# Patient Record
Sex: Female | Born: 1935 | Race: Black or African American | Hispanic: No | Smoking: Never smoker
Health system: Southern US, Community
[De-identification: ages and names within clinical notes are randomized; demographics above are authoritative.]

## PROBLEM LIST (undated history)

## (undated) DIAGNOSIS — I10 Essential (primary) hypertension: Secondary | ICD-10-CM

## (undated) DIAGNOSIS — F0390 Unspecified dementia without behavioral disturbance: Secondary | ICD-10-CM

## (undated) DIAGNOSIS — E7800 Pure hypercholesterolemia, unspecified: Secondary | ICD-10-CM

## (undated) HISTORY — PX: ABDOMINAL HYSTERECTOMY: SHX81

---

## 1999-07-04 ENCOUNTER — Encounter: Payer: Self-pay | Admitting: Obstetrics and Gynecology

## 1999-07-04 ENCOUNTER — Encounter: Admission: RE | Admit: 1999-07-04 | Discharge: 1999-07-04 | Payer: Self-pay | Admitting: Obstetrics and Gynecology

## 1999-07-04 ENCOUNTER — Other Ambulatory Visit: Admission: RE | Admit: 1999-07-04 | Discharge: 1999-07-04 | Payer: Self-pay | Admitting: Obstetrics and Gynecology

## 1999-08-08 ENCOUNTER — Emergency Department (HOSPITAL_COMMUNITY): Admission: EM | Admit: 1999-08-08 | Discharge: 1999-08-08 | Payer: Self-pay | Admitting: Emergency Medicine

## 1999-08-09 ENCOUNTER — Encounter: Payer: Self-pay | Admitting: Emergency Medicine

## 1999-11-26 ENCOUNTER — Encounter: Payer: Self-pay | Admitting: *Deleted

## 1999-11-26 ENCOUNTER — Encounter: Admission: RE | Admit: 1999-11-26 | Discharge: 1999-11-26 | Payer: Self-pay | Admitting: *Deleted

## 2000-09-28 ENCOUNTER — Other Ambulatory Visit: Admission: RE | Admit: 2000-09-28 | Discharge: 2000-09-28 | Payer: Self-pay | Admitting: Obstetrics and Gynecology

## 2000-10-01 ENCOUNTER — Encounter: Admission: RE | Admit: 2000-10-01 | Discharge: 2000-10-01 | Payer: Self-pay | Admitting: Obstetrics and Gynecology

## 2000-10-01 ENCOUNTER — Encounter: Payer: Self-pay | Admitting: Obstetrics and Gynecology

## 2002-08-01 ENCOUNTER — Encounter: Payer: Self-pay | Admitting: Internal Medicine

## 2002-08-01 ENCOUNTER — Encounter: Admission: RE | Admit: 2002-08-01 | Discharge: 2002-08-01 | Payer: Self-pay | Admitting: Internal Medicine

## 2004-04-29 ENCOUNTER — Encounter: Admission: RE | Admit: 2004-04-29 | Discharge: 2004-04-29 | Payer: Self-pay | Admitting: Internal Medicine

## 2005-06-26 ENCOUNTER — Encounter: Admission: RE | Admit: 2005-06-26 | Discharge: 2005-06-26 | Payer: Self-pay | Admitting: Diagnostic Radiology

## 2006-08-06 ENCOUNTER — Encounter: Admission: RE | Admit: 2006-08-06 | Discharge: 2006-08-06 | Payer: Self-pay | Admitting: Internal Medicine

## 2007-09-02 ENCOUNTER — Encounter: Admission: RE | Admit: 2007-09-02 | Discharge: 2007-09-02 | Payer: Self-pay | Admitting: Internal Medicine

## 2008-09-28 ENCOUNTER — Encounter: Admission: RE | Admit: 2008-09-28 | Discharge: 2008-09-28 | Payer: Self-pay | Admitting: Internal Medicine

## 2009-10-11 ENCOUNTER — Encounter: Admission: RE | Admit: 2009-10-11 | Discharge: 2009-10-11 | Payer: Self-pay | Admitting: Internal Medicine

## 2010-10-31 ENCOUNTER — Other Ambulatory Visit: Payer: Self-pay | Admitting: Internal Medicine

## 2010-10-31 DIAGNOSIS — Z1231 Encounter for screening mammogram for malignant neoplasm of breast: Secondary | ICD-10-CM

## 2010-11-04 ENCOUNTER — Ambulatory Visit: Payer: Self-pay

## 2010-12-05 ENCOUNTER — Ambulatory Visit
Admission: RE | Admit: 2010-12-05 | Discharge: 2010-12-05 | Disposition: A | Discharge status: Home / Self Care | Payer: Medicare Other | Source: Ambulatory Visit | Attending: Internal Medicine | Admitting: Internal Medicine

## 2010-12-05 DIAGNOSIS — Z1231 Encounter for screening mammogram for malignant neoplasm of breast: Secondary | ICD-10-CM

## 2011-06-28 ENCOUNTER — Encounter (HOSPITAL_COMMUNITY): Payer: Self-pay | Admitting: Emergency Medicine

## 2011-06-28 ENCOUNTER — Emergency Department (HOSPITAL_COMMUNITY)
Admission: EM | Admit: 2011-06-28 | Discharge: 2011-06-28 | Disposition: A | Discharge status: Home / Self Care | Payer: Medicare Other | Source: Home / Self Care | Attending: Emergency Medicine | Admitting: Emergency Medicine

## 2011-06-28 DIAGNOSIS — R0981 Nasal congestion: Secondary | ICD-10-CM

## 2011-06-28 DIAGNOSIS — R05 Cough: Secondary | ICD-10-CM

## 2011-06-28 DIAGNOSIS — J3489 Other specified disorders of nose and nasal sinuses: Secondary | ICD-10-CM

## 2011-06-28 HISTORY — DX: Essential (primary) hypertension: I10

## 2011-06-28 HISTORY — DX: Pure hypercholesterolemia, unspecified: E78.00

## 2011-06-28 MED ORDER — CETIRIZINE HCL 10 MG PO CHEW: 10.0000 mg | CHEWABLE_TABLET | Freq: Every day | ORAL | Status: DC

## 2011-06-28 MED ORDER — GUAIFENESIN-CODEINE 100-10 MG/5ML PO SYRP: 5.0000 mL | ORAL_SOLUTION | Freq: Three times a day (TID) | ORAL | Status: AC | PRN

## 2011-06-28 NOTE — ED Provider Notes (Signed)
History     CSN: [id number]  Arrival date & time 06/28/11  1140   First MD Initiated Contact with Patient 06/28/11 1233      Chief Complaint  Patient presents with  . URI    (Consider location/radiation/quality/duration/timing/severity/associated sxs/prior treatment) HPI Comments: Patient presents urgent care complaining of 2-3 days of head congestion, dripping behind her throat runny and congested nose patient denies any shortness of breath wheezing or any fevers describes her cough as dry and nonproductive. No body aches no headaches does feel some sinus pressure. No nausea or vomiting.  Patient is a 77 y.o. female presenting with URI. The history is provided by the patient.  URI The primary symptoms include fever, sore throat and cough. Primary symptoms do not include wheezing, nausea, vomiting, myalgias, arthralgias or rash. The current episode started 2 days ago. This is a new problem. The problem has not changed since onset. The sore throat is not accompanied by trouble swallowing.  Symptoms associated with the illness include chills, sinus pressure, congestion and rhinorrhea.    Past Medical History  Diagnosis Date  . Hypertension   . High cholesterol     Past Surgical History  Procedure Date  . Abdominal hysterectomy     No family history on file.  History  Substance Use Topics  . Smoking status: Never Smoker   . Smokeless tobacco: Not on file  . Alcohol Use: No    OB History    Grav Para Term Preterm Abortions TAB SAB Ect Mult Living                  Review of Systems  Constitutional: Positive for fever, chills and activity change.  HENT: Positive for congestion, sore throat, rhinorrhea and sinus pressure. Negative for trouble swallowing and voice change.   Eyes: Negative for pain.  Respiratory: Positive for cough. Negative for chest tightness and wheezing.   Gastrointestinal: Negative for nausea and vomiting.  Musculoskeletal: Negative for myalgias and  arthralgias.  Skin: Negative for rash.    Allergies  Penicillins  Home Medications   Current Outpatient Rx  Name Route Sig Dispense Refill  . OVER THE COUNTER MEDICATION  Reports one blood pressure medicine taken 2 times each day One cholesterol medicine, 2 tablets once each day.  Also takes multivitamins      BP 129/75  Pulse 66  Temp(Src) 99.8 F (37.7 C) (Oral)  Resp 18  SpO2 100%  Physical Exam  Nursing note and vitals reviewed. Constitutional: She appears well-developed and well-nourished.  HENT:  Head: Normocephalic.  Right Ear: Tympanic membrane normal.  Left Ear: Tympanic membrane normal.  Mouth/Throat: Uvula is midline. Posterior oropharyngeal erythema present.  Eyes: Conjunctivae are normal. No scleral icterus.  Neck: Neck supple. No JVD present.  Cardiovascular: Normal rate.   Pulmonary/Chest: Effort normal. No respiratory distress. She has no decreased breath sounds. She has no wheezes. She has no rhonchi. She has no rales. She exhibits no tenderness.  Musculoskeletal: Normal range of motion.  Lymphadenopathy:    She has no cervical adenopathy.  Neurological: She is alert.  Skin: No rash noted.    ED Course  Procedures (including critical care time)  Labs Reviewed - No data to display No results found.   No diagnosis found.    MDM   Mild upper respiratory congestion, mild sinus congestion postnasal dripping for 3 days. Afebrile comfortable not dyspneic possibly viral or compartmental changes symptomatic management and encourage with Zyrtec and we will provide her  with cough syrup and she's having a dry bothersome cough that does not allow her to sleep and rest well.       Rosana Hoes, MD 06/28/11 [phone]

## 2011-06-28 NOTE — ED Notes (Signed)
C/o head congestion and feels congestion in chest.  Denies wheezing, denies fever.  Non productive cough.

## 2011-06-28 NOTE — Discharge Instructions (Signed)
  As discussed try to hydrate herself well and use a humidifier at night and takes Zyrtec. Avoid any other over-the-counter medicines. Can take his syrup cautiously as it can make you feel dizzy for your cough every 6 hours and bothersome especially at night.   Cough, Adult  A cough is a reflex. It helps you clear your throat and airways. A cough can help heal your body. A cough can last 2 or 3 weeks (acute) or may last more than 8 weeks (chronic). Some common causes of a cough can include an infection, allergy, or a cold. HOME CARE  Only take medicine as told by your doctor.   If given, take your medicines (antibiotics) as told. Finish them even if you start to feel better.   Use a cold steam vaporizer or humidier in your home. This can help loosen thick spit (secretions).   Sleep so you are almost sitting up (semi-upright). Use pillows to do this. This helps reduce coughing.   Rest as needed.   Stop smoking if you smoke.  GET HELP RIGHT AWAY IF:  You have yellowish-white fluid (pus) in your thick spit.   Your cough gets worse.   Your medicine does not reduce coughing, and you are losing sleep.   You cough up blood.   You have trouble breathing.   Your pain gets worse and medicine does not help.   You have a fever.  MAKE SURE YOU:   Understand these instructions.   Will watch your condition.   Will get help right away if you are not doing well or get worse.  Document Released: 11/21/2010 Document Revised: 02/27/2011 Document Reviewed: 11/21/2010 [facility] Patient Information 2012 [place].

## 2012-01-19 ENCOUNTER — Other Ambulatory Visit: Payer: Self-pay | Admitting: Internal Medicine

## 2012-01-19 DIAGNOSIS — Z1231 Encounter for screening mammogram for malignant neoplasm of breast: Secondary | ICD-10-CM

## 2012-02-10 ENCOUNTER — Ambulatory Visit: Payer: Medicare Other

## 2012-02-20 ENCOUNTER — Ambulatory Visit
Admission: RE | Admit: 2012-02-20 | Discharge: 2012-02-20 | Disposition: A | Discharge status: Home / Self Care | Payer: Medicare Other | Source: Ambulatory Visit | Attending: Internal Medicine | Admitting: Internal Medicine

## 2012-02-20 DIAGNOSIS — Z1231 Encounter for screening mammogram for malignant neoplasm of breast: Secondary | ICD-10-CM

## 2013-03-28 ENCOUNTER — Other Ambulatory Visit: Payer: Self-pay

## 2013-03-28 DIAGNOSIS — Z1231 Encounter for screening mammogram for malignant neoplasm of breast: Secondary | ICD-10-CM

## 2013-03-29 ENCOUNTER — Ambulatory Visit
Admission: RE | Admit: 2013-03-29 | Discharge: 2013-03-29 | Disposition: A | Discharge status: Home / Self Care | Payer: Self-pay | Source: Ambulatory Visit

## 2013-03-29 DIAGNOSIS — Z1231 Encounter for screening mammogram for malignant neoplasm of breast: Secondary | ICD-10-CM

## 2014-05-22 ENCOUNTER — Other Ambulatory Visit: Payer: Self-pay

## 2014-05-22 DIAGNOSIS — Z1231 Encounter for screening mammogram for malignant neoplasm of breast: Secondary | ICD-10-CM

## 2014-06-20 ENCOUNTER — Ambulatory Visit
Admission: RE | Admit: 2014-06-20 | Discharge: 2014-06-20 | Disposition: A | Discharge status: Home / Self Care | Payer: Medicare Other | Source: Ambulatory Visit

## 2014-06-20 DIAGNOSIS — Z1231 Encounter for screening mammogram for malignant neoplasm of breast: Secondary | ICD-10-CM

## 2015-05-22 ENCOUNTER — Other Ambulatory Visit: Payer: Self-pay

## 2015-05-22 DIAGNOSIS — Z1231 Encounter for screening mammogram for malignant neoplasm of breast: Secondary | ICD-10-CM

## 2015-09-15 ENCOUNTER — Encounter (HOSPITAL_COMMUNITY): Payer: Self-pay | Admitting: Emergency Medicine

## 2015-09-15 ENCOUNTER — Ambulatory Visit (HOSPITAL_COMMUNITY): Payer: Medicare Other

## 2015-09-15 ENCOUNTER — Ambulatory Visit (HOSPITAL_COMMUNITY)
Admission: EM | Admit: 2015-09-15 | Discharge: 2015-09-15 | Disposition: A | Discharge status: Home / Self Care | Payer: Medicare Other | Attending: Emergency Medicine | Admitting: Emergency Medicine

## 2015-09-15 DIAGNOSIS — S20212A Contusion of left front wall of thorax, initial encounter: Secondary | ICD-10-CM | POA: Diagnosis not present

## 2015-09-15 DIAGNOSIS — M25562 Pain in left knee: Secondary | ICD-10-CM | POA: Diagnosis present

## 2015-09-15 DIAGNOSIS — E7800 Pure hypercholesterolemia, unspecified: Secondary | ICD-10-CM | POA: Diagnosis not present

## 2015-09-15 DIAGNOSIS — S66912A Strain of unspecified muscle, fascia and tendon at wrist and hand level, left hand, initial encounter: Secondary | ICD-10-CM | POA: Diagnosis not present

## 2015-09-15 DIAGNOSIS — Z880 Allergy status to penicillin: Secondary | ICD-10-CM | POA: Diagnosis not present

## 2015-09-15 DIAGNOSIS — W19XXXA Unspecified fall, initial encounter: Secondary | ICD-10-CM | POA: Diagnosis not present

## 2015-09-15 DIAGNOSIS — S8002XA Contusion of left knee, initial encounter: Secondary | ICD-10-CM | POA: Diagnosis not present

## 2015-09-15 DIAGNOSIS — I10 Essential (primary) hypertension: Secondary | ICD-10-CM | POA: Diagnosis not present

## 2015-09-15 MED ORDER — TRAMADOL HCL 50 MG PO TABS: 50.0000 mg | ORAL_TABLET | Freq: Four times a day (QID) | ORAL | Status: DC | PRN

## 2015-09-15 NOTE — ED Notes (Signed)
Pt reports she fell down in a grocery store and inj her left knee, left hand and sustained a contusion to chest as she fell forward Slow gait... Daughter at bedside... A&O x4... No acute distress.

## 2015-09-15 NOTE — Discharge Instructions (Signed)
I reviewed your x-rays, there is no sign of broken bone. You do have some swelling and bruising. If I get the report tonight, I will call you. Otherwise, I will call you with the results tomorrow. Apply ice to your wrist and knee as often as you can. This will help with the swelling. The knee wrapped in the Ace wrap for the next 48 hours. Take Tylenol or ibuprofen as needed for pain. Use the tramadol every 6-8 hours as needed for severe pain. This medicine may make you drowsy or off balance. Follow-up as needed.

## 2015-09-15 NOTE — ED Provider Notes (Signed)
CSN: [id number]     Arrival date & time 09/15/15  1920 History   First MD Initiated Contact with Patient 09/15/15 1931     Chief Complaint  Patient presents with  . Fall   (Consider location/radiation/quality/duration/timing/severity/associated sxs/prior Treatment) HPI  She is a 81 year old woman here for evaluation after a fall. She states she went to pick up a watermelon at the grocery store earlier today when her feet slipped and she fell. She states she landed on her left knee and left hand. She also hit her chest on something. No head injury or loss of consciousness. She is able to ambulate, but states she has pain in the left knee with ambulation. She also reports swelling over the anterior knee. This is the area of pain. She also reports some swelling and pain in the left ulnar wrist. No chest pain or shortness of breath. She denies any preceding dizziness or chest pain.  Past Medical History  Diagnosis Date  . Hypertension   . High cholesterol    Past Surgical History  Procedure Laterality Date  . Abdominal hysterectomy     History reviewed. No pertinent family history. Social History  Substance Use Topics  . Smoking status: Never Smoker   . Smokeless tobacco: None  . Alcohol Use: No   OB History    No data available     Review of Systems As in history of present illness Allergies  Penicillins  Home Medications   Prior to Admission medications   Medication Sig Start Date End Date Taking? Authorizing Provider  cetirizine (ZYRTEC) 10 MG chewable tablet Chew 1 tablet (10 mg total) by mouth daily. 06/28/11 07/04/12  Rosana Hoes, MD  OVER THE COUNTER MEDICATION Reports one blood pressure medicine taken 2 times each day One cholesterol medicine, 2 tablets once each day.  Also takes multivitamins    Historical Provider, MD  traMADol (ULTRAM) 50 MG tablet Take 1 tablet (50 mg total) by mouth every 6 (six) hours as needed. 09/15/15   Melony Overly, MD   Meds Ordered and  Administered this Visit  Medications - No data to display  BP 172/88 mmHg  Pulse 78  Temp(Src) 98.4 F (36.9 C) (Oral)  Resp 20  SpO2 98% No data found.   Physical Exam  Constitutional: She is oriented to person, place, and time. She appears well-developed and well-nourished. No distress.  Cardiovascular: Normal rate.   Pulmonary/Chest: Effort normal.  Musculoskeletal:  Left knee: There is soft tissue swelling over the patella. This is quite tender to palpation. No erythema or bruising seen. No joint line tenderness. Left wrist: There is some swelling over the ulnar wrist. She is tender over the distal ulna. Brisk cap refill in all digits. Sensation grossly intact. Pain with wrist movements.  Neurological: She is alert and oriented to person, place, and time.  Skin:  Small bruise just inferior to the medial left clavicle. This is nontender.    ED Course  Procedures (including critical care time)  Labs Review Labs Reviewed - No data to display  Imaging Review No results found. I have personally reviewed the x-rays and there is no obvious fracture or dislocation of the knee or wrist.  MDM   1. Fall, initial encounter   2. Knee contusion, left, initial encounter   3. Wrist strain, left, initial encounter   4. Contusion, chest wall, left, initial encounter    Discussed options with patient and her daughter. We will go ahead and discharge him  with symptomatic treatment for bruising and strain. I will call them with the results of the x-rays tomorrow afternoon. Ace wrap applied to knee for support and compression. Recommended frequent icing. Tylenol or ibuprofen as needed for pain. Prescription given for tramadol to use as needed for severe pain. Follow-up as needed.    Melony Overly, MD 09/15/15 2107

## 2015-10-11 ENCOUNTER — Ambulatory Visit: Payer: Medicare Other

## 2015-10-11 ENCOUNTER — Ambulatory Visit
Admission: RE | Admit: 2015-10-11 | Discharge: 2015-10-11 | Disposition: A | Discharge status: Home / Self Care | Payer: Medicare Other | Source: Ambulatory Visit

## 2015-10-11 ENCOUNTER — Other Ambulatory Visit: Payer: Self-pay | Admitting: Internal Medicine

## 2015-10-11 DIAGNOSIS — Z1231 Encounter for screening mammogram for malignant neoplasm of breast: Secondary | ICD-10-CM

## 2017-01-14 ENCOUNTER — Other Ambulatory Visit: Payer: Self-pay | Admitting: Internal Medicine

## 2017-01-14 DIAGNOSIS — Z1231 Encounter for screening mammogram for malignant neoplasm of breast: Secondary | ICD-10-CM

## 2017-02-03 ENCOUNTER — Ambulatory Visit: Payer: Medicare Other

## 2017-04-27 ENCOUNTER — Ambulatory Visit
Admission: RE | Admit: 2017-04-27 | Discharge: 2017-04-27 | Disposition: A | Discharge status: Home / Self Care | Payer: Medicare HMO | Source: Ambulatory Visit | Attending: Internal Medicine | Admitting: Internal Medicine

## 2017-04-27 DIAGNOSIS — Z1231 Encounter for screening mammogram for malignant neoplasm of breast: Secondary | ICD-10-CM

## 2017-07-06 ENCOUNTER — Encounter (HOSPITAL_COMMUNITY): Payer: Self-pay | Admitting: Family Medicine

## 2017-07-06 ENCOUNTER — Ambulatory Visit (HOSPITAL_COMMUNITY)
Admission: EM | Admit: 2017-07-06 | Discharge: 2017-07-06 | Disposition: A | Discharge status: Home / Self Care | Payer: Medicare HMO | Attending: Family Medicine | Admitting: Family Medicine

## 2017-07-06 DIAGNOSIS — K5790 Diverticulosis of intestine, part unspecified, without perforation or abscess without bleeding: Secondary | ICD-10-CM

## 2017-07-06 LAB — POCT URINALYSIS DIP (DEVICE)
GLUCOSE, UA: NEGATIVE mg/dL
HGB URINE DIPSTICK: NEGATIVE
Leukocytes, UA: NEGATIVE
Nitrite: NEGATIVE
PH: 6 (ref 5.0–8.0)
PROTEIN: 100 mg/dL — AB
SPECIFIC GRAVITY, URINE: 1.02 (ref 1.005–1.030)
UROBILINOGEN UA: 0.2 mg/dL (ref 0.0–1.0)

## 2017-07-06 MED ORDER — CIPROFLOXACIN HCL 500 MG PO TABS: 500.0000 mg | ORAL_TABLET | Freq: Two times a day (BID) | ORAL | 0 refills | Status: DC

## 2017-07-06 MED ORDER — ACETAMINOPHEN 325 MG PO TABS
ORAL_TABLET | ORAL | Status: AC
  Filled 2017-07-06: qty 1

## 2017-07-06 MED ORDER — ACETAMINOPHEN 325 MG PO TABS
650.0000 mg | ORAL_TABLET | Freq: Once | ORAL | Status: AC
  Administered 2017-07-06: 650 mg via ORAL

## 2017-07-06 MED ORDER — METRONIDAZOLE 500 MG PO TABS: 500.0000 mg | ORAL_TABLET | Freq: Two times a day (BID) | ORAL | 0 refills | Status: DC

## 2017-07-06 NOTE — ED Triage Notes (Addendum)
Pt here for abd cramping during the day for about 1 week. sts some nausea. Worse when eating or drinking. LBM this am and slight diarrhea. Denies any rectal bleeding.

## 2017-07-06 NOTE — ED Provider Notes (Signed)
[place]    CSN: [id number] Arrival date & time: 07/06/17  1808     History   Chief Complaint Chief Complaint  Patient presents with  . Abdominal Pain    HPI Donna Butler is a 83 y.o. female.   83 year old female with complaints of lower abdominal pain.  She is basically healthy for her age.  She takes lisinopril and lovastatin.  She has had some diarrhea prior to this episode of pain has had some constipation history.  She is unaware that she has a fever today.  She denies any cough urinary tract symptoms myalgias or basically anything else to suggest a cause for the fever.  HPI  Past Medical History:  Diagnosis Date  . High cholesterol   . Hypertension     There are no active problems to display for this patient.   Past Surgical History:  Procedure Laterality Date  . ABDOMINAL HYSTERECTOMY      OB History   None      Home Medications    Prior to Admission medications   Medication Sig Start Date End Date Taking? Authorizing Provider  lisinopril (PRINIVIL,ZESTRIL) 10 MG tablet Take 10 mg by mouth daily.   Yes [provider]  lovastatin (MEVACOR) 40 MG tablet Take 40 mg by mouth at bedtime.   Yes [provider]  cetirizine (ZYRTEC) 10 MG chewable tablet Chew 1 tablet (10 mg total) by mouth daily. 06/28/11 07/04/12  Rosana Hoes, MD  OVER THE COUNTER MEDICATION Reports one blood pressure medicine taken 2 times each day One cholesterol medicine, 2 tablets once each day.  Also takes multivitamins    [provider]    Family History History reviewed. No pertinent family history.  Social History Social History   Tobacco Use  . Smoking status: Never Smoker  Substance Use Topics  . Alcohol use: No  . Drug use: No     Allergies   Penicillins   Review of Systems Review of Systems  Constitutional: Positive for fever.  HENT: Negative.   Respiratory: Negative.   Cardiovascular: Negative.   Gastrointestinal:  Positive for abdominal pain and diarrhea.  Musculoskeletal: Negative.   Neurological: Negative.      Physical Exam Triage Vital Signs ED Triage Vitals  Enc Vitals Group     BP 07/06/17 1845 (!) 126/59     Pulse Rate 07/06/17 1845 62     Resp 07/06/17 1845 18     Temp 07/06/17 1845 (!) 102.3 F (39.1 C)     Temp src --      SpO2 07/06/17 1845 96 %     Weight --      Height --      Head Circumference --      Peak Flow --      Pain Score 07/06/17 1842 6     Pain Loc --      Pain Edu? --      Excl. in [place]? --    No data found.  Updated Vital Signs BP (!) 126/59   Pulse 62   Temp (!) 102.3 F (39.1 C)   Resp 18   SpO2 96%   Visual Acuity Right Eye Distance:   Left Eye Distance:   Bilateral Distance:    Right Eye Near:   Left Eye Near:    Bilateral Near:     Physical Exam  Constitutional: She appears well-developed and well-nourished.  Eyes: Pupils are equal, round, and reactive to light.  Pulmonary/Chest: Effort normal.  Abdominal: Bowel sounds are normal. She exhibits no abdominal bruit. There is no tenderness.     UC Treatments / Results  Labs (all labs ordered are listed, but only abnormal results are displayed) Labs Reviewed  POCT URINALYSIS DIP (DEVICE) - Abnormal; Notable for the following components:      Result Value   Bilirubin Urine SMALL (*)    Ketones, ur TRACE (*)    Protein, ur 100 (*)    All other components within normal limits    EKG None Radiology No results found.  Procedures Procedures (including critical care time)  Medications Ordered in UC Medications  acetaminophen (TYLENOL) tablet 650 mg (has no administration in time range)     Initial Impression / Assessment and Plan / UC Course  I have reviewed the triage vital signs and the nursing notes.  Pertinent labs & imaging results that were available during my care of the patient were reviewed by me and considered in my medical decision making (see chart for details).      Abdominal pain with fever and diarrhea.  With history of constipation and lack of any other obvious source of fever would favor diverticular disease.  I can palpate no mass or abscess in the lower abdomen there is no guarding or rebound.  Final Clinical Impressions(s) / UC Diagnoses   Final diagnoses:  None    ED Discharge Orders    None       Controlled Substance Prescriptions  Controlled Substance Registry consulted? No   Wardell Honour, MD 07/06/17 [phone]

## 2017-07-07 ENCOUNTER — Inpatient Hospital Stay (HOSPITAL_COMMUNITY)
Admission: EM | Admit: 2017-07-07 | Discharge: 2017-07-10 | DRG: 392 | Disposition: A | Discharge status: Home / Self Care | Payer: Medicare HMO | Attending: Internal Medicine | Admitting: Internal Medicine

## 2017-07-07 ENCOUNTER — Emergency Department (HOSPITAL_COMMUNITY): Payer: Medicare HMO

## 2017-07-07 ENCOUNTER — Encounter (HOSPITAL_COMMUNITY): Payer: Self-pay | Admitting: Emergency Medicine

## 2017-07-07 DIAGNOSIS — Z880 Allergy status to penicillin: Secondary | ICD-10-CM

## 2017-07-07 DIAGNOSIS — A09 Infectious gastroenteritis and colitis, unspecified: Principal | ICD-10-CM | POA: Diagnosis present

## 2017-07-07 DIAGNOSIS — E876 Hypokalemia: Secondary | ICD-10-CM | POA: Diagnosis present

## 2017-07-07 DIAGNOSIS — K838 Other specified diseases of biliary tract: Secondary | ICD-10-CM | POA: Diagnosis present

## 2017-07-07 DIAGNOSIS — R932 Abnormal findings on diagnostic imaging of liver and biliary tract: Secondary | ICD-10-CM | POA: Diagnosis present

## 2017-07-07 DIAGNOSIS — E7800 Pure hypercholesterolemia, unspecified: Secondary | ICD-10-CM | POA: Diagnosis present

## 2017-07-07 DIAGNOSIS — K529 Noninfective gastroenteritis and colitis, unspecified: Secondary | ICD-10-CM | POA: Diagnosis present

## 2017-07-07 DIAGNOSIS — K5100 Ulcerative (chronic) pancolitis without complications: Secondary | ICD-10-CM

## 2017-07-07 DIAGNOSIS — Z79899 Other long term (current) drug therapy: Secondary | ICD-10-CM

## 2017-07-07 DIAGNOSIS — I10 Essential (primary) hypertension: Secondary | ICD-10-CM | POA: Diagnosis present

## 2017-07-07 DIAGNOSIS — D7389 Other diseases of spleen: Secondary | ICD-10-CM | POA: Diagnosis present

## 2017-07-07 DIAGNOSIS — D739 Disease of spleen, unspecified: Secondary | ICD-10-CM

## 2017-07-07 DIAGNOSIS — R935 Abnormal findings on diagnostic imaging of other abdominal regions, including retroperitoneum: Secondary | ICD-10-CM | POA: Diagnosis present

## 2017-07-07 LAB — COMPREHENSIVE METABOLIC PANEL
ALK PHOS: 86 U/L (ref 38–126)
ALT: 13 U/L — ABNORMAL LOW (ref 14–54)
ANION GAP: 9 (ref 5–15)
AST: 13 U/L — ABNORMAL LOW (ref 15–41)
Albumin: 3.2 g/dL — ABNORMAL LOW (ref 3.5–5.0)
BUN: 13 mg/dL (ref 6–20)
CALCIUM: 8.6 mg/dL — AB (ref 8.9–10.3)
CHLORIDE: 103 mmol/L (ref 101–111)
CO2: 25 mmol/L (ref 22–32)
CREATININE: 0.79 mg/dL (ref 0.44–1.00)
Glucose, Bld: 96 mg/dL (ref 65–99)
Potassium: 2.9 mmol/L — ABNORMAL LOW (ref 3.5–5.1)
SODIUM: 137 mmol/L (ref 135–145)
Total Bilirubin: 0.6 mg/dL (ref 0.3–1.2)
Total Protein: 6.3 g/dL — ABNORMAL LOW (ref 6.5–8.1)

## 2017-07-07 LAB — LIPASE, BLOOD: LIPASE: 18 U/L (ref 11–51)

## 2017-07-07 LAB — CBC
HCT: 34.5 % — ABNORMAL LOW (ref 36.0–46.0)
HEMOGLOBIN: 11.4 g/dL — AB (ref 12.0–15.0)
MCH: 27.9 pg (ref 26.0–34.0)
MCHC: 33 g/dL (ref 30.0–36.0)
MCV: 84.4 fL (ref 78.0–100.0)
PLATELETS: 269 10*3/uL (ref 150–400)
RBC: 4.09 MIL/uL (ref 3.87–5.11)
RDW: 14 % (ref 11.5–15.5)
WBC: 10.7 10*3/uL — AB (ref 4.0–10.5)

## 2017-07-07 MED ORDER — POTASSIUM CHLORIDE CRYS ER 20 MEQ PO TBCR
40.0000 meq | EXTENDED_RELEASE_TABLET | Freq: Once | ORAL | Status: AC
  Administered 2017-07-08: 40 meq via ORAL
  Filled 2017-07-07: qty 2

## 2017-07-07 MED ORDER — IOPAMIDOL (ISOVUE-300) INJECTION 61%
30.0000 mL | Freq: Once | INTRAVENOUS | Status: AC | PRN
  Administered 2017-07-07: 30 mL via ORAL

## 2017-07-07 MED ORDER — IOPAMIDOL (ISOVUE-300) INJECTION 61%
INTRAVENOUS | Status: AC
  Filled 2017-07-07: qty 30

## 2017-07-07 NOTE — ED Notes (Signed)
Patient unable to give urine sample at this time

## 2017-07-07 NOTE — ED Notes (Signed)
Pt notified that urine specimen needed. Will return when pt needs to void.

## 2017-07-07 NOTE — ED Provider Notes (Signed)
[facility] DEPT Provider Note   CSN: [id number] Arrival date & time: 07/07/17  2038     History   Chief Complaint Chief Complaint  Patient presents with  . Abdominal Pain    HPI Donna Butler is a 83 y.o. female.  HPI  83 year old female presents with abdominal pain.  She states the abdominal pain has been ongoing for about 5 days.  The pain comes and goes and feels like a cramping sensation.  She has been having about 3 diarrheal stools per day.  She states there makes between watery and soft.  No blood.  She has not been having fevers that she knows of except that when she was in urgent care yesterday her temperature was measured to be 102.  She was discharged from the urgent care for presumed diverticular disease with ciprofloxacin and Flagyl.  The pain is not worse today but has still continued.  At the moment I am talking to her she denies any pain at all.  She states the pain seems to come and go, often when she is about to go to the bathroom.  No urinary symptoms or back pain.  No nausea or vomiting.  Past Medical History:  Diagnosis Date  . High cholesterol   . Hypertension     There are no active problems to display for this patient.   Past Surgical History:  Procedure Laterality Date  . ABDOMINAL HYSTERECTOMY       OB History   None      Home Medications    Prior to Admission medications   Medication Sig Start Date End Date Taking? Authorizing Provider  ciprofloxacin (CIPRO) 500 MG tablet Take 1 tablet (500 mg total) by mouth every 12 (twelve) hours. 07/06/17  Yes Wardell Honour, MD  lisinopril (PRINIVIL,ZESTRIL) 10 MG tablet Take 10 mg by mouth daily.   Yes [provider]  lovastatin (MEVACOR) 40 MG tablet Take 40 mg by mouth at bedtime.   Yes [provider]  metroNIDAZOLE (FLAGYL) 500 MG tablet Take 1 tablet (500 mg total) by mouth 2 (two) times daily. 07/06/17  Yes Wardell Honour, MD  cetirizine  (ZYRTEC) 10 MG chewable tablet Chew 1 tablet (10 mg total) by mouth daily. 06/28/11 07/04/12  Rosana Hoes, MD    Family History No family history on file.  Social History Social History   Tobacco Use  . Smoking status: Never Smoker  Substance Use Topics  . Alcohol use: No  . Drug use: No     Allergies   Penicillins   Review of Systems Review of Systems  Constitutional: Positive for fever.  Respiratory: Negative for shortness of breath.   Cardiovascular: Negative for chest pain.  Gastrointestinal: Positive for abdominal pain and diarrhea. Negative for blood in stool, nausea and vomiting.  Genitourinary: Negative for dysuria and hematuria.  Musculoskeletal: Negative for back pain.  All other systems reviewed and are negative.    Physical Exam Updated Vital Signs BP 133/74   Pulse 78   Temp 98.5 F (36.9 C) (Oral)   Resp 18   Ht 5' 2"  (1.575 m)   Wt 52.4 kg (115 lb 9.6 oz)   SpO2 100%   BMI 21.14 kg/m   Physical Exam  Constitutional: She is oriented to person, place, and time. She appears well-developed and well-nourished.  Non-toxic appearance. She does not appear ill. No distress.  HENT:  Head: Normocephalic and atraumatic.  Right Ear: External ear normal.  Left Ear: External ear normal.  Nose: Nose normal.  Eyes: Right eye exhibits no discharge. Left eye exhibits no discharge.  Cardiovascular: Normal rate, regular rhythm and normal heart sounds.  Pulmonary/Chest: Effort normal and breath sounds normal.  Abdominal: Soft. There is tenderness in the left lower quadrant.  Neurological: She is alert and oriented to person, place, and time.  Skin: Skin is warm and dry.  Nursing note and vitals reviewed.    ED Treatments / Results  Labs (all labs ordered are listed, but only abnormal results are displayed) Labs Reviewed  COMPREHENSIVE METABOLIC PANEL - Abnormal; Notable for the following components:      Result Value   Potassium 2.9 (*)    Calcium 8.6 (*)      Total Protein 6.3 (*)    Albumin 3.2 (*)    AST 13 (*)    ALT 13 (*)    All other components within normal limits  CBC - Abnormal; Notable for the following components:   WBC 10.7 (*)    Hemoglobin 11.4 (*)    HCT 34.5 (*)    All other components within normal limits  LIPASE, BLOOD  URINALYSIS, ROUTINE W REFLEX MICROSCOPIC    EKG None  Radiology No results found.  Procedures Procedures (including critical care time)  Medications Ordered in ED Medications  potassium chloride SA (K-DUR,KLOR-CON) CR tablet 40 mEq (has no administration in time range)  iopamidol (ISOVUE-300) 61 % injection 30 mL (30 mLs Oral Contrast Given 07/07/17 2219)     Initial Impression / Assessment and Plan / ED Course  I have reviewed the triage vital signs and the nursing notes.  Pertinent labs & imaging results that were available during my care of the patient were reviewed by me and considered in my medical decision making (see chart for details).     Given the fever yesterday and focal left lower quadrant tenderness, CT scan will be obtained to help rule out complication of diverticular disease.  If the CT only shows acute diverticulitis, her pain seems well enough controlled that I think she could continue outpatient antibiotics.  She will need prescription for hypokalemia.  Was given a dose of K-Dur here.  Otherwise, CT scan pending wound care transferred to Dr. Florina Ou.  Final Clinical Impressions(s) / ED Diagnoses   Final diagnoses:  None    ED Discharge Orders    None       Sherwood Gambler, MD 07/08/17 0001

## 2017-07-07 NOTE — ED Triage Notes (Signed)
Patient c/o lower abdominal pain x5 days with diarrhea. Denies vomiting. Denies chest pain.

## 2017-07-08 ENCOUNTER — Other Ambulatory Visit: Payer: Self-pay

## 2017-07-08 ENCOUNTER — Emergency Department (HOSPITAL_COMMUNITY): Payer: Medicare HMO

## 2017-07-08 ENCOUNTER — Encounter (HOSPITAL_COMMUNITY): Payer: Self-pay

## 2017-07-08 DIAGNOSIS — D739 Disease of spleen, unspecified: Secondary | ICD-10-CM

## 2017-07-08 DIAGNOSIS — K529 Noninfective gastroenteritis and colitis, unspecified: Secondary | ICD-10-CM | POA: Diagnosis present

## 2017-07-08 DIAGNOSIS — E876 Hypokalemia: Secondary | ICD-10-CM | POA: Diagnosis present

## 2017-07-08 DIAGNOSIS — K838 Other specified diseases of biliary tract: Secondary | ICD-10-CM

## 2017-07-08 DIAGNOSIS — I10 Essential (primary) hypertension: Secondary | ICD-10-CM | POA: Diagnosis not present

## 2017-07-08 DIAGNOSIS — D7389 Other diseases of spleen: Secondary | ICD-10-CM | POA: Diagnosis present

## 2017-07-08 LAB — COMPREHENSIVE METABOLIC PANEL
ALBUMIN: 2.9 g/dL — AB (ref 3.5–5.0)
ALK PHOS: 77 U/L (ref 38–126)
ALT: 15 U/L (ref 14–54)
AST: 15 U/L (ref 15–41)
Anion gap: 11 (ref 5–15)
BUN: 9 mg/dL (ref 6–20)
CALCIUM: 8.7 mg/dL — AB (ref 8.9–10.3)
CO2: 24 mmol/L (ref 22–32)
CREATININE: 0.77 mg/dL (ref 0.44–1.00)
Chloride: 104 mmol/L (ref 101–111)
GFR calc Af Amer: >60 mL/min (ref >60)
GFR calc non Af Amer: >60 mL/min (ref >60)
GLUCOSE: 90 mg/dL (ref 65–99)
Potassium: 3.3 mmol/L — ABNORMAL LOW (ref 3.5–5.1)
SODIUM: 139 mmol/L (ref 135–145)
Total Bilirubin: 0.6 mg/dL (ref 0.3–1.2)
Total Protein: 6.1 g/dL — ABNORMAL LOW (ref 6.5–8.1)

## 2017-07-08 LAB — CBC WITH DIFFERENTIAL/PLATELET
BASOS PCT: 0 %
Basophils Absolute: 0 10*3/uL (ref 0.0–0.1)
EOS ABS: 0.1 10*3/uL (ref 0.0–0.7)
EOS PCT: 1 %
HEMATOCRIT: 33.6 % — AB (ref 36.0–46.0)
Hemoglobin: 11.1 g/dL — ABNORMAL LOW (ref 12.0–15.0)
Lymphocytes Relative: 12 %
Lymphs Abs: 0.9 10*3/uL (ref 0.7–4.0)
MCH: 27.8 pg (ref 26.0–34.0)
MCHC: 33 g/dL (ref 30.0–36.0)
MCV: 84.2 fL (ref 78.0–100.0)
MONO ABS: 1.1 10*3/uL — AB (ref 0.1–1.0)
MONOS PCT: 15 %
NEUTROS ABS: 5.5 10*3/uL (ref 1.7–7.7)
Neutrophils Relative %: 72 %
Platelets: 234 10*3/uL (ref 150–400)
RBC: 3.99 MIL/uL (ref 3.87–5.11)
RDW: 14.1 % (ref 11.5–15.5)
WBC: 7.6 10*3/uL (ref 4.0–10.5)

## 2017-07-08 LAB — URINALYSIS, ROUTINE W REFLEX MICROSCOPIC
BILIRUBIN URINE: NEGATIVE
GLUCOSE, UA: NEGATIVE mg/dL
HGB URINE DIPSTICK: NEGATIVE
KETONES UR: 5 mg/dL — AB
Nitrite: NEGATIVE
PH: 6 (ref 5.0–8.0)
Protein, ur: 100 mg/dL — AB
SPECIFIC GRAVITY, URINE: 1.023 (ref 1.005–1.030)

## 2017-07-08 LAB — GASTROINTESTINAL PANEL BY PCR, STOOL (REPLACES STOOL CULTURE)
ADENOVIRUS F40/41: NOT DETECTED
ASTROVIRUS: NOT DETECTED
CAMPYLOBACTER SPECIES: NOT DETECTED
CYCLOSPORA CAYETANENSIS: NOT DETECTED
Cryptosporidium: NOT DETECTED
ENTEROPATHOGENIC E COLI (EPEC): NOT DETECTED
ENTEROTOXIGENIC E COLI (ETEC): NOT DETECTED
Entamoeba histolytica: NOT DETECTED
Enteroaggregative E coli (EAEC): NOT DETECTED
Giardia lamblia: NOT DETECTED
Norovirus GI/GII: NOT DETECTED
PLESIMONAS SHIGELLOIDES: NOT DETECTED
Rotavirus A: NOT DETECTED
Salmonella species: NOT DETECTED
Sapovirus (I, II, IV, and V): NOT DETECTED
Shiga like toxin producing E coli (STEC): NOT DETECTED
Shigella/Enteroinvasive E coli (EIEC): NOT DETECTED
VIBRIO CHOLERAE: NOT DETECTED
VIBRIO SPECIES: NOT DETECTED
Yersinia enterocolitica: NOT DETECTED

## 2017-07-08 LAB — MAGNESIUM: Magnesium: 1.8 mg/dL (ref 1.7–2.4)

## 2017-07-08 LAB — C DIFFICILE QUICK SCREEN W PCR REFLEX
C Diff antigen: POSITIVE — AB
C Diff toxin: NEGATIVE

## 2017-07-08 LAB — GLUCOSE, CAPILLARY: Glucose-Capillary: 66 mg/dL (ref 65–99)

## 2017-07-08 LAB — CLOSTRIDIUM DIFFICILE BY PCR, REFLEXED: Toxigenic C. Difficile by PCR: POSITIVE — AB

## 2017-07-08 MED ORDER — POTASSIUM CHLORIDE CRYS ER 20 MEQ PO TBCR: 20.0000 meq | EXTENDED_RELEASE_TABLET | Freq: Two times a day (BID) | ORAL | 0 refills | Status: DC

## 2017-07-08 MED ORDER — HYDRALAZINE HCL 20 MG/ML IJ SOLN: 10.0000 mg | Freq: Four times a day (QID) | INTRAMUSCULAR | Status: DC | PRN

## 2017-07-08 MED ORDER — PRAVASTATIN SODIUM 20 MG PO TABS
40.0000 mg | ORAL_TABLET | Freq: Every day | ORAL | Status: DC
  Administered 2017-07-08 – 2017-07-09 (×2): 40 mg via ORAL
  Filled 2017-07-08 (×2): qty 2

## 2017-07-08 MED ORDER — MAGNESIUM SULFATE 2 GM/50ML IV SOLN
2.0000 g | Freq: Once | INTRAVENOUS | Status: DC
  Filled 2017-07-08: qty 50

## 2017-07-08 MED ORDER — LISINOPRIL 10 MG PO TABS
10.0000 mg | ORAL_TABLET | Freq: Every day | ORAL | Status: DC
  Administered 2017-07-08 – 2017-07-10 (×3): 10 mg via ORAL
  Filled 2017-07-08 (×3): qty 1

## 2017-07-08 MED ORDER — POTASSIUM CHLORIDE CRYS ER 20 MEQ PO TBCR
40.0000 meq | EXTENDED_RELEASE_TABLET | Freq: Once | ORAL | Status: AC
  Administered 2017-07-08: 40 meq via ORAL
  Filled 2017-07-08: qty 2

## 2017-07-08 MED ORDER — ACETAMINOPHEN 325 MG PO TABS: 650.0000 mg | ORAL_TABLET | Freq: Four times a day (QID) | ORAL | Status: DC | PRN

## 2017-07-08 MED ORDER — CIPROFLOXACIN IN D5W 400 MG/200ML IV SOLN
400.0000 mg | Freq: Two times a day (BID) | INTRAVENOUS | Status: DC
  Administered 2017-07-08 – 2017-07-09 (×3): 400 mg via INTRAVENOUS
  Filled 2017-07-08 (×3): qty 200

## 2017-07-08 MED ORDER — HYDROCODONE-ACETAMINOPHEN 5-325 MG PO TABS
1.0000 | ORAL_TABLET | ORAL | Status: DC | PRN
  Administered 2017-07-09: 2 via ORAL
  Filled 2017-07-08: qty 2

## 2017-07-08 MED ORDER — POTASSIUM CHLORIDE CRYS ER 20 MEQ PO TBCR
40.0000 meq | EXTENDED_RELEASE_TABLET | Freq: Once | ORAL | Status: AC
Start: 2017-07-08 — End: 2017-07-08
  Administered 2017-07-08: 40 meq via ORAL
  Filled 2017-07-08: qty 2

## 2017-07-08 MED ORDER — ACETAMINOPHEN 650 MG RE SUPP: 650.0000 mg | Freq: Four times a day (QID) | RECTAL | Status: DC | PRN

## 2017-07-08 MED ORDER — IOPAMIDOL (ISOVUE-300) INJECTION 61%
INTRAVENOUS | Status: AC
  Filled 2017-07-08: qty 100

## 2017-07-08 MED ORDER — ONDANSETRON HCL 4 MG/2ML IJ SOLN: 4.0000 mg | Freq: Four times a day (QID) | INTRAMUSCULAR | Status: DC | PRN

## 2017-07-08 MED ORDER — IOPAMIDOL (ISOVUE-300) INJECTION 61%
100.0000 mL | Freq: Once | INTRAVENOUS | Status: AC | PRN
  Administered 2017-07-08: 80 mL via INTRAVENOUS

## 2017-07-08 MED ORDER — POTASSIUM CHLORIDE IN NACL 40-0.9 MEQ/L-% IV SOLN
INTRAVENOUS | Status: AC
  Administered 2017-07-08: 100 mL/h via INTRAVENOUS
  Filled 2017-07-08: qty 1000

## 2017-07-08 MED ORDER — METRONIDAZOLE IN NACL 5-0.79 MG/ML-% IV SOLN
500.0000 mg | Freq: Three times a day (TID) | INTRAVENOUS | Status: DC
  Administered 2017-07-08 – 2017-07-09 (×4): 500 mg via INTRAVENOUS
  Filled 2017-07-08 (×4): qty 100

## 2017-07-08 MED ORDER — SODIUM CHLORIDE 0.9% FLUSH
3.0000 mL | Freq: Two times a day (BID) | INTRAVENOUS | Status: DC
  Administered 2017-07-08 – 2017-07-10 (×5): 3 mL via INTRAVENOUS

## 2017-07-08 MED ORDER — ONDANSETRON HCL 4 MG PO TABS: 4.0000 mg | ORAL_TABLET | Freq: Four times a day (QID) | ORAL | Status: DC | PRN

## 2017-07-08 NOTE — H&P (Signed)
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 female with medical history significant for hypertension, now presenting to the emergency department for evaluation of abdominal pain and diarrhea.  Patient reports that her symptoms developed little less than a week ago and she reports persistent cramping and pain in the mid and lower abdomen.  She reports at least 3 episodes of diarrhea daily since the onset of symptoms.  She reports some nausea with a couple episodes of nonbloody vomiting during this period as well.  She has not appreciated any fever, but was seen at urgent care yesterday and noted to be febrile there.  There was concern for diverticulitis clinically at that time and she was started on ciprofloxacin and Flagyl.  Symptoms persisted unchanged and she now presents for evaluation in the ED.  She denies any chest pain, shortness of breath, or cough.  Denies any recent antibiotic use or travel.  Denies any history of C. difficile colitis.  ED Course: Upon arrival to the ED, patient is found to be afebrile, saturating well on room air, and with vitals otherwise stable.  Chemistry panel is notable for potassium of 2.9 and CBC features a mild leukocytosis.  CT of the abdomen and pelvis reveals diffuse wall thickening of the entire colon consistent with pancolitis.  No free air is noted on the CT, but incidental notation is made of intra-and extrahepatic biliary enlargement without an obvious mass, as well as an 8 mm splenic lesion.  Patient was treated with 40 mEq oral potassium in the ED.  She remains hemodynamically stable, in no apparent respiratory distress, and will be observed on the telemetry unit for ongoing evaluation and management of colitis with fevers.  Review of Systems:  All other systems reviewed and apart  from HPI, are negative.  Past Medical History:  Diagnosis Date  . High cholesterol   . Hypertension     Past Surgical History:  Procedure Laterality Date  . ABDOMINAL HYSTERECTOMY       reports that she has never smoked. She does not have any smokeless tobacco history on file. She reports that she does not drink alcohol or use drugs.  Allergies  Allergen Reactions  . Penicillins     Has patient had a PCN reaction causing immediate rash, facial/tongue/throat swelling, SOB or lightheadedness with hypotension: Unknown Has patient had a PCN reaction causing severe rash involving mucus membranes or skin necrosis: No Has patient had a PCN reaction that required hospitalization: No Has patient had a PCN reaction occurring within the last 10 years: No If all of the above answers are "NO", then may proceed with Cephalosporin use.     History reviewed. No pertinent family history.   Prior to Admission medications   Medication Sig Start Date End Date Taking? Authorizing Provider  ciprofloxacin (CIPRO) 500 MG tablet Take 1 tablet (500 mg total) by mouth every 12 (twelve) hours. 07/06/17  Yes Wardell Honour, MD  lisinopril (PRINIVIL,ZESTRIL) 10 MG tablet Take 10 mg by mouth daily.   Yes [provider]  lovastatin (MEVACOR) 40 MG tablet Take 40 mg by mouth at bedtime.   Yes [provider]  metroNIDAZOLE (FLAGYL) 500 MG tablet Take 1 tablet (500 mg total) by mouth 2 (two) times daily. 07/06/17  Yes Wardell Honour, MD  potassium chloride SA (K-DUR,KLOR-CON) 20 MEQ tablet Take 1 tablet (20 mEq total) by mouth 2 (two) times daily for 4 days. 07/08/17 07/12/17  Sherwood Gambler, MD    Physical Exam: Vitals:   07/07/17 2318 07/07/17 2330 07/08/17 0000 07/08/17 0030  BP: 133/74 124/71 116/68 117/65  Pulse: 78 80 80 77  Resp: 18 19 16 17   Temp:      TempSrc:      SpO2: 100% 99% 100% 99%  Weight:      Height:          Constitutional: NAD, calm  Eyes: PERTLA, lids  and conjunctivae normal ENMT: Mucous membranes are moist. Posterior pharynx clear of any exudate or lesions.   Neck: normal, supple, no masses, no thyromegaly Respiratory: clear to auscultation bilaterally, no wheezing, no crackles. Normal respiratory effort.   Cardiovascular: S1 & S2 heard, regular rate and rhythm. No significant JVD. Abdomen: No distension, soft, tender in mid and lower abdomen without rebound pain or guarding. Bowel sounds active.  Musculoskeletal: no clubbing / cyanosis. No joint deformity upper and lower extremities.   Skin: no significant rashes, lesions, ulcers. Warm, dry, well-perfused. Neurologic: CN 2-12 grossly intact. Sensation intact. Strength 5/5 in all 4 limbs.  Psychiatric:  Alert and oriented x 3. Calm, cooperative.     Labs on Admission: I have personally reviewed following labs and imaging studies  CBC: Recent Labs  Lab 07/07/17 2103  WBC 10.7*  HGB 11.4*  HCT 34.5*  MCV 84.4  PLT 242   Basic Metabolic Panel: Recent Labs  Lab 07/07/17 2103  NA 137  K 2.9*  CL 103  CO2 25  GLUCOSE 96  BUN 13  CREATININE 0.79  CALCIUM 8.6*   GFR: Estimated Creatinine Clearance: 44.4 mL/min (by C-G formula based on SCr of 0.79 mg/dL). Liver Function Tests: Recent Labs  Lab 07/07/17 2103  AST 13*  ALT 13*  ALKPHOS 86  BILITOT 0.6  PROT 6.3*  ALBUMIN 3.2*   Recent Labs  Lab 07/07/17 2103  LIPASE 18   No results for input(s): AMMONIA in the last 168 hours. Coagulation Profile: No results for input(s): INR, PROTIME in the last 168 hours. Cardiac Enzymes: No results for input(s): CKTOTAL, CKMB, CKMBINDEX, TROPONINI in the last 168 hours. BNP (last 3 results) No results for input(s): PROBNP in the last 8760 hours. HbA1C: No results for input(s): HGBA1C in the last 72 hours. CBG: No results for input(s): GLUCAP in the last 168 hours. Lipid Profile: No results for input(s): CHOL, HDL, LDLCALC, TRIG, CHOLHDL, LDLDIRECT in the last 72  hours. Thyroid Function Tests: No results for input(s): TSH, T4TOTAL, FREET4, T3FREE, THYROIDAB in the last 72 hours. Anemia Panel: No results for input(s): VITAMINB12, FOLATE, FERRITIN, TIBC, IRON, RETICCTPCT in the last 72 hours. Urine analysis:    Component Value Date/Time   COLORURINE AMBER (A) 07/07/2017 2229   APPEARANCEUR CLEAR 07/07/2017 2229   LABSPEC 1.023 07/07/2017 2229   PHURINE 6.0 07/07/2017 2229   GLUCOSEU NEGATIVE 07/07/2017 2229   HGBUR NEGATIVE 07/07/2017 2229   BILIRUBINUR NEGATIVE 07/07/2017 2229   KETONESUR 5 (A) 07/07/2017 2229   PROTEINUR 100 (A) 07/07/2017 2229   UROBILINOGEN 0.2 07/06/2017 1852   NITRITE NEGATIVE 07/07/2017 2229   LEUKOCYTESUR TRACE (A) 07/07/2017 2229   Sepsis Labs: @LABRCNTIP (procalcitonin:4,lacticidven:4) )No results found for this or any previous visit (from the past 240 hour(s)).   Radiological Exams on Admission: Ct Abdomen Pelvis W Contrast  Result Date: 07/08/2017 CLINICAL DATA:  Lower  abdominal pain and diarrhea EXAM: CT ABDOMEN AND PELVIS WITH CONTRAST TECHNIQUE: Multidetector CT imaging of the abdomen and pelvis was performed using the standard protocol following bolus administration of intravenous contrast. CONTRAST:  80 mL Isovue 300 intravenous COMPARISON:  None. FINDINGS: Lower chest: Lung bases demonstrate patchy dependent atelectasis. No consolidation or effusion. Normal heart size. Hepatobiliary: Mild intra hepatic biliary dilatation. There is extrahepatic biliary dilatation with common bile duct measuring up to 15 mm. Small stones or wall calcification in the gallbladder which appears somewhat small in size. Pancreas: Slight enlargement of the proximal duct. No inflammatory changes. Spleen: 9 mm hypodense lesion in the spleen. Adrenals/Urinary Tract: Adrenal glands are unremarkable. Kidneys are normal, without renal calculi, focal lesion, or hydronephrosis. Bladder is unremarkable. Stomach/Bowel: Stomach is nonenlarged. No  dilated small bowel. Diffuse colon wall thickening consistent with colitis. Mild sigmoid colon diverticular changes. Appendix is not identified with certainty. Vascular/Lymphatic: Moderate aortic atherosclerosis. No aneurysmal dilatation. No significantly enlarged lymph nodes. Reproductive: Status post hysterectomy. No adnexal masses. Other: Negative for free air or free fluid Musculoskeletal: No acute or suspicious abnormality IMPRESSION: 1. Diffuse wall thickening of the colon consistent with pancolitis of infectious or inflammatory etiology, consider C difficile. No obstruction. No free air. 2. Mild intra hepatic and moderate extrahepatic biliary enlargement with slight enlargement of the proximal pancreatic duct. No obvious mass, however recommend correlation with LFTs and consider nonemergent MRCP evaluation 3. 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 Electronically Signed   By: Donavan Foil M.D.   On: 07/08/2017 01:42    EKG: Not performed.   Assessment/Plan  1. Colitis  - Presents with several days of cramping lower abdominal pain, diarrhea, and nausea  - Found to be febrile with mild leukocytosis  - CT abd/pelvis features pancolitis without free-air  - No recent abx, no hx of C diff  - Continue empiric Cipro and Flagyl, IVF hydration, and check GI pathogen panel   2. Hypokalemia  - Serum potassium is 2.9 on admission  - Secondary to GI-losses  - 40 mEq oral potassium given and KCl added to IVF  - Continue cardiac monitoring and repeat chem panel in am    3. Hypertension  - BP at goal  - Continue lisinopril as tolerated   4. Splenic lesion  - Noted incidentally on CT - Outpatient follow-up recommended   5. Biliary dilation  - Incidental notation is made on CT abd/pelvis of mild intrahepatic and moderate extrahepatic biliary dilation without definite mass  - LFT's are wnl and her abdominal tenderness in mainly in lower quadrants with minimal  tenderness in RUQ or epigastrium  - Consider MRCP for further eval    DVT prophylaxis: SCD's  Code Status: Full  Family Communication: Daughter updated at bedside Consults called: None Admission status: Observation    Vianne Bulls, MD [facility] Pager [phone]  If 7PM-7AM, please contact night-coverage [email or web address] Password [email or web address]  07/08/2017, 2:31 AM

## 2017-07-08 NOTE — Progress Notes (Signed)
Patient seen and evaluated, chart reviewed, please see EMR for updated orders. Please see full H&P dictated by admitting physician Dr Myna Hidalgo for same date of service.    1)Diarrhea-CT abdomen and pelvis with pancolitis suspicious for possible C. Difficile, C. difficile antigen positive C. difficile toxin negative C. difficile by PCR reflex positive, GI panel in progress, white count is down to 7.6 from 10.7.Marland KitchenMarland Kitchen Patient is currently on Cipro and Flagyl, ID consult requested, Dr Linus Salmons Paged    2)HTN-continue lisinopril, Use Novolog/Humalog Sliding scale insulin with Accu-Cheks/Fingersticks as ordered    3)FEN/Hypokalemia-due to GI losses, replace potassium and recheck,    Patient seen and evaluated, chart reviewed, please see EMR for updated orders. Please see full H&P dictated by admitting physician Dr Myna Hidalgo for same date of service.

## 2017-07-08 NOTE — ED Provider Notes (Signed)
Nursing notes and vitals signs, including pulse oximetry, reviewed.  Summary of this visit's results, reviewed by myself:  EKG:  EKG Interpretation  Date/Time:    Ventricular Rate:    PR Interval:    QRS Duration:   QT Interval:    QTC Calculation:   R Axis:     Text Interpretation:         Labs:  Results for orders placed or performed during the hospital encounter of 07/07/17 (from the past 24 hour(s))  Lipase, blood     Status: None   Collection Time: 07/07/17  9:03 PM  Result Value Ref Range   Lipase 18 11 - 51 U/L  Comprehensive metabolic panel     Status: Abnormal   Collection Time: 07/07/17  9:03 PM  Result Value Ref Range   Sodium 137 135 - 145 mmol/L   Potassium 2.9 (L) 3.5 - 5.1 mmol/L   Chloride 103 101 - 111 mmol/L   CO2 25 22 - 32 mmol/L   Glucose, Bld 96 65 - 99 mg/dL   BUN 13 6 - 20 mg/dL   Creatinine, Ser 0.79 0.44 - 1.00 mg/dL   Calcium 8.6 (L) 8.9 - 10.3 mg/dL   Total Protein 6.3 (L) 6.5 - 8.1 g/dL   Albumin 3.2 (L) 3.5 - 5.0 g/dL   AST 13 (L) 15 - 41 U/L   ALT 13 (L) 14 - 54 U/L   Alkaline Phosphatase 86 38 - 126 U/L   Total Bilirubin 0.6 0.3 - 1.2 mg/dL   GFR calc non Af Amer >60 >60 mL/min   GFR calc Af Amer >60 >60 mL/min   Anion gap 9 5 - 15  CBC     Status: Abnormal   Collection Time: 07/07/17  9:03 PM  Result Value Ref Range   WBC 10.7 (H) 4.0 - 10.5 K/uL   RBC 4.09 3.87 - 5.11 MIL/uL   Hemoglobin 11.4 (L) 12.0 - 15.0 g/dL   HCT 34.5 (L) 36.0 - 46.0 %   MCV 84.4 78.0 - 100.0 fL   MCH 27.9 26.0 - 34.0 pg   MCHC 33.0 30.0 - 36.0 g/dL   RDW 14.0 11.5 - 15.5 %   Platelets 269 150 - 400 K/uL  Urinalysis, Routine w reflex microscopic     Status: Abnormal   Collection Time: 07/07/17 10:29 PM  Result Value Ref Range   Color, Urine AMBER (A) YELLOW   APPearance CLEAR CLEAR   Specific Gravity, Urine 1.023 1.005 - 1.030   pH 6.0 5.0 - 8.0   Glucose, UA NEGATIVE NEGATIVE mg/dL   Hgb urine dipstick NEGATIVE NEGATIVE   Bilirubin Urine  NEGATIVE NEGATIVE   Ketones, ur 5 (A) NEGATIVE mg/dL   Protein, ur 100 (A) NEGATIVE mg/dL   Nitrite NEGATIVE NEGATIVE   Leukocytes, UA TRACE (A) NEGATIVE   RBC / HPF 0-5 0 - 5 RBC/hpf   WBC, UA 6-30 0 - 5 WBC/hpf   Bacteria, UA RARE (A) NONE SEEN   Squamous Epithelial / LPF 0-5 (A) NONE SEEN   Mucus PRESENT    Hyaline Casts, UA PRESENT     Imaging Studies: Ct Abdomen Pelvis W Contrast  Result Date: 07/08/2017 CLINICAL DATA:  Lower abdominal pain and diarrhea EXAM: CT ABDOMEN AND PELVIS WITH CONTRAST TECHNIQUE: Multidetector CT imaging of the abdomen and pelvis was performed using the standard protocol following bolus administration of intravenous contrast. CONTRAST:  80 mL Isovue 300 intravenous COMPARISON:  None. FINDINGS: Lower chest: Lung bases  demonstrate patchy dependent atelectasis. No consolidation or effusion. Normal heart size. Hepatobiliary: Mild intra hepatic biliary dilatation. There is extrahepatic biliary dilatation with common bile duct measuring up to 15 mm. Small stones or wall calcification in the gallbladder which appears somewhat small in size. Pancreas: Slight enlargement of the proximal duct. No inflammatory changes. Spleen: 9 mm hypodense lesion in the spleen. Adrenals/Urinary Tract: Adrenal glands are unremarkable. Kidneys are normal, without renal calculi, focal lesion, or hydronephrosis. Bladder is unremarkable. Stomach/Bowel: Stomach is nonenlarged. No dilated small bowel. Diffuse colon wall thickening consistent with colitis. Mild sigmoid colon diverticular changes. Appendix is not identified with certainty. Vascular/Lymphatic: Moderate aortic atherosclerosis. No aneurysmal dilatation. No significantly enlarged lymph nodes. Reproductive: Status post hysterectomy. No adnexal masses. Other: Negative for free air or free fluid Musculoskeletal: No acute or suspicious abnormality IMPRESSION: 1. Diffuse wall thickening of the colon consistent with pancolitis of infectious or  inflammatory etiology, consider C difficile. No obstruction. No free air. 2. Mild intra hepatic and moderate extrahepatic biliary enlargement with slight enlargement of the proximal pancreatic duct. No obvious mass, however recommend correlation with LFTs and consider nonemergent MRCP evaluation 3. 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 Electronically Signed   By: Donavan Foil M.D.   On: 07/08/2017 01:42   Dr. Myna Hidalgo to admit for pancolitis.   Thierno Hun, Jenny Reichmann, MD 07/08/17 [phone]

## 2017-07-08 NOTE — Progress Notes (Signed)
Patient stated that she did not have any meals today. RN will give patient what is available on this floor. Meals have been modified to reflect assistance in ordering.

## 2017-07-08 NOTE — Progress Notes (Signed)
PHARMACY NOTE:  ANTIMICROBIAL RENAL DOSAGE ADJUSTMENT  Current antimicrobial regimen includes a mismatch between antimicrobial dosage and estimated renal function.  As per policy approved by the Pharmacy & Therapeutics and Medical Executive Committees, the antimicrobial dosage will be adjusted accordingly.  Current antimicrobial dosage:  Cipro 200 mg IV q12h  Indication: IAI  Renal Function:  Estimated Creatinine Clearance: 44.4 mL/min (by C-G formula based on SCr of 0.79 mg/dL). []      On intermittent HD, scheduled: []      On CRRT    Antimicrobial dosage has been changed to:  Cipro 400 mg IV q12h     Thank you for allowing pharmacy to be a part of this patient's care.  Dorrene German, [facility] 07/08/2017 3:25 AM

## 2017-07-08 NOTE — ED Notes (Signed)
Patient returned from CT

## 2017-07-09 DIAGNOSIS — K529 Noninfective gastroenteritis and colitis, unspecified: Secondary | ICD-10-CM | POA: Diagnosis not present

## 2017-07-09 LAB — GLUCOSE, CAPILLARY: GLUCOSE-CAPILLARY: 85 mg/dL (ref 65–99)

## 2017-07-09 LAB — BASIC METABOLIC PANEL
Anion gap: 8 (ref 5–15)
BUN: 5 mg/dL — AB (ref 6–20)
CALCIUM: 9 mg/dL (ref 8.9–10.3)
CHLORIDE: 112 mmol/L — AB (ref 101–111)
CO2: 24 mmol/L (ref 22–32)
CREATININE: 0.66 mg/dL (ref 0.44–1.00)
GFR calc Af Amer: >60 mL/min (ref >60)
GFR calc non Af Amer: >60 mL/min (ref >60)
Glucose, Bld: 95 mg/dL (ref 65–99)
Potassium: 4.1 mmol/L (ref 3.5–5.1)
SODIUM: 144 mmol/L (ref 135–145)

## 2017-07-09 MED ORDER — VANCOMYCIN 50 MG/ML ORAL SOLUTION
125.0000 mg | Freq: Four times a day (QID) | ORAL | Status: DC
  Administered 2017-07-09 – 2017-07-10 (×5): 125 mg via ORAL
  Filled 2017-07-09 (×7): qty 2.5

## 2017-07-09 MED ORDER — ENSURE ENLIVE PO LIQD
237.0000 mL | Freq: Two times a day (BID) | ORAL | Status: DC
  Administered 2017-07-09 – 2017-07-10 (×3): 237 mL via ORAL

## 2017-07-09 NOTE — Progress Notes (Signed)
PROGRESS NOTE    Donna Butler  MWN:[id number] DOB: Mar 01, 1935 DOA: 07/07/2017 PCP: Jilda Panda, MD (Confirm with patient/family/NH records and if not entered, this HAS to be entered at [facility] point of entry. "No PCP" if truly none.)   Brief Narrative:   83 y.o. female with medical history significant for hypertension, now presenting to the emergency department for evaluation of abdominal pain and diarrhea.  Patient reports that her symptoms developed little less than a week ago and she reports persistent cramping and pain in the mid and lower abdomen.  She reports at least 3 episodes of diarrhea daily since the onset of symptoms.  She reports some nausea with a couple episodes of nonbloody vomiting during this period as well.  She has not appreciated any fever, but was seen at urgent care yesterday and noted to be febrile there.  There was concern for diverticulitis clinically at that time and she was started on ciprofloxacin and Flagyl.  Symptoms persisted unchanged and she now presents for evaluation in the ED.  She denies any chest pain, shortness of breath, or cough.  Denies any recent antibiotic use or travel.  Denies any history of C. difficile colitis.  Assessment & Plan:   Principal Problem:   Colitis presumed infectious Active Problems:   Hypokalemia   Hypertension   Colitis   Dilation of biliary tract   Splenic lesion  # Pancolitis  Diarrhea:  Results for C diff indeterminant.  Positive for toxigenic C diff with little to no toxin production, but clinical picture with pancolitis and diarrhea concerning for C diff.  Will d/c cipro/flagyl and start PO vancomycin.  Continue to monitor symptoms while on PO vanc.  Seems like overall, symptoms may be improving.   Negative GI path panel Discussed above with ID on call  # Hypokalemia  -improved, follow  # Hypertension  - BP at goal  - Continue lisinopril as tolerated   # Splenic lesion  - Noted incidentally on CT - Outpatient  follow-up recommended (6 month MRI)  # Biliary dilation  - Incidental notation is made on CT abd/pelvis of mild intrahepatic and moderate extrahepatic biliary dilation without definite mass  - LFT's are wnl and her abdominal tenderness in mainly in lower quadrants with minimal tenderness in RUQ or epigastrium  - Consider MRCP    DVT prophylaxis: scd Code Status:full  Family Communication: discussed with daughter Disposition Plan: pending further improvement in diarrhea and abdominal discomfort   Consultants:   Discussed over phone with ID  Procedures:   noen  Antimicrobials:  Anti-infectives (From admission, onward)   Start     Dose/Rate Route Frequency Ordered Stop   07/09/17 1200  vancomycin (VANCOCIN) 50 mg/mL oral solution 125 mg     125 mg Oral 4 times daily 07/09/17 1117 07/19/17 1359   07/08/17 0300  metroNIDAZOLE (FLAGYL) IVPB 500 mg  Status:  Discontinued     500 mg 100 mL/hr over 60 Minutes Intravenous Every 8 hours 07/08/17 0231 07/09/17 1235   07/08/17 0245  ciprofloxacin (CIPRO) IVPB 400 mg  Status:  Discontinued     400 mg 200 mL/hr over 60 Minutes Intravenous Every 12 hours 07/08/17 0231 07/09/17 1235         Subjective: Doing ok this AM.  Feeling better.   Objective: Vitals:   07/08/17 2053 07/09/17 0615 07/09/17 1043 07/09/17 1401  BP: 114/61 128/69 (!) 110/57 134/76  Pulse: 85 65 64 78  Resp: 16 18  20   Temp:  99.3 F (37.4 C) 98.9 F (37.2 C)  98.1 F (36.7 C)  TempSrc: Oral Oral  Oral  SpO2: 100% 100% 99% 100%  Weight:  51.6 kg (113 lb 11.2 oz)    Height:        Intake/Output Summary (Last 24 hours) at 07/09/2017 1915 Last data filed at 07/09/2017 1700 Gross per 24 hour  Intake 1220 ml  Output -  Net 1220 ml   Filed Weights   07/07/17 2047 07/08/17 0641 07/09/17 0615  Weight: 52.4 kg (115 lb 9.6 oz) 51.5 kg (113 lb 8.6 oz) 51.6 kg (113 lb 11.2 oz)    Examination:  General exam: Appears calm and comfortable  Respiratory system:  Clear to auscultation. Respiratory effort normal. Cardiovascular system: S1 & S2 heard, RRR. No JVD, murmurs, rubs, gallops or clicks. No pedal edema. Gastrointestinal system: Abdomen is only mildly tender, soft. No organomegaly or masses felt. Normal bowel sounds heard. Central nervous system: Alert and oriented. No focal neurological deficits. Extremities: Symmetric 5 x 5 power. Skin: No rashes, lesions or ulcers Psychiatry: Judgement and insight appear normal. Mood & affect appropriate.     Data Reviewed: I have personally reviewed following labs and imaging studies  CBC: Recent Labs  Lab 07/07/17 2103 07/08/17 0701  WBC 10.7* 7.6  NEUTROABS  --  5.5  HGB 11.4* 11.1*  HCT 34.5* 33.6*  MCV 84.4 84.2  PLT 269 409   Basic Metabolic Panel: Recent Labs  Lab 07/07/17 2103 07/08/17 0701 07/09/17 0429  NA 137 139 144  K 2.9* 3.3* 4.1  CL 103 104 112*  CO2 25 24 24   GLUCOSE 96 90 95  BUN 13 9 5*  CREATININE 0.79 0.77 0.66  CALCIUM 8.6* 8.7* 9.0  MG  --  1.8  --    GFR: Estimated Creatinine Clearance: 42.3 mL/min (by C-G formula based on SCr of 0.66 mg/dL). Liver Function Tests: Recent Labs  Lab 07/07/17 2103 07/08/17 0701  AST 13* 15  ALT 13* 15  ALKPHOS 86 77  BILITOT 0.6 0.6  PROT 6.3* 6.1*  ALBUMIN 3.2* 2.9*   Recent Labs  Lab 07/07/17 2103  LIPASE 18   No results for input(s): AMMONIA in the last 168 hours. Coagulation Profile: No results for input(s): INR, PROTIME in the last 168 hours. Cardiac Enzymes: No results for input(s): CKTOTAL, CKMB, CKMBINDEX, TROPONINI in the last 168 hours. BNP (last 3 results) No results for input(s): PROBNP in the last 8760 hours. HbA1C: No results for input(s): HGBA1C in the last 72 hours. CBG: Recent Labs  Lab 07/08/17 0800 07/09/17 0749  GLUCAP 66 85   Lipid Profile: No results for input(s): CHOL, HDL, LDLCALC, TRIG, CHOLHDL, LDLDIRECT in the last 72 hours. Thyroid Function Tests: No results for input(s):  TSH, T4TOTAL, FREET4, T3FREE, THYROIDAB in the last 72 hours. Anemia Panel: No results for input(s): VITAMINB12, FOLATE, FERRITIN, TIBC, IRON, RETICCTPCT in the last 72 hours. Sepsis Labs: No results for input(s): PROCALCITON, LATICACIDVEN in the last 168 hours.  Recent Results (from the past 240 hour(s))  Gastrointestinal Panel by PCR , Stool     Status: None   Collection Time: 07/08/17  9:21 AM  Result Value Ref Range Status   Campylobacter species NOT DETECTED NOT DETECTED Final   Plesimonas shigelloides NOT DETECTED NOT DETECTED Final   Salmonella species NOT DETECTED NOT DETECTED Final   Yersinia enterocolitica NOT DETECTED NOT DETECTED Final   Vibrio species NOT DETECTED NOT DETECTED Final   Vibrio cholerae  NOT DETECTED NOT DETECTED Final   Enteroaggregative E coli (EAEC) NOT DETECTED NOT DETECTED Final   Enteropathogenic E coli (EPEC) NOT DETECTED NOT DETECTED Final   Enterotoxigenic E coli (ETEC) NOT DETECTED NOT DETECTED Final   Shiga like toxin producing E coli (STEC) NOT DETECTED NOT DETECTED Final   Shigella/Enteroinvasive E coli (EIEC) NOT DETECTED NOT DETECTED Final   Cryptosporidium NOT DETECTED NOT DETECTED Final   Cyclospora cayetanensis NOT DETECTED NOT DETECTED Final   Entamoeba histolytica NOT DETECTED NOT DETECTED Final   Giardia lamblia NOT DETECTED NOT DETECTED Final   Adenovirus F40/41 NOT DETECTED NOT DETECTED Final   Astrovirus NOT DETECTED NOT DETECTED Final   Norovirus GI/GII NOT DETECTED NOT DETECTED Final   Rotavirus A NOT DETECTED NOT DETECTED Final   Sapovirus (I, II, IV, and V) NOT DETECTED NOT DETECTED Final    Comment: Performed at [facility], [address] [place], [place] [address]  C difficile quick scan w PCR reflex     Status: Abnormal   Collection Time: 07/08/17  9:21 AM  Result Value Ref Range Status   C Diff antigen POSITIVE (A) NEGATIVE Final   C Diff toxin NEGATIVE NEGATIVE Final   C Diff interpretation Results are  indeterminate. See PCR results.  Final    Comment: Performed at [facility], [place] [address] [place], [place] [address]  C. Diff by PCR, Reflexed     Status: Abnormal   Collection Time: 07/08/17  9:21 AM  Result Value Ref Range Status   Toxigenic C. Difficile by PCR POSITIVE (A) NEGATIVE Final    Comment: Positive for toxigenic C. difficile with little to no toxin production. Only treat if clinical presentation suggests symptomatic illness. Performed at [facility], [place] [address], [place],  [address]          Radiology Studies: Ct Abdomen Pelvis W Contrast  Result Date: 07/08/2017 CLINICAL DATA:  Lower abdominal pain and diarrhea EXAM: CT ABDOMEN AND PELVIS WITH CONTRAST TECHNIQUE: Multidetector CT imaging of the abdomen and pelvis was performed using the standard protocol following bolus administration of intravenous contrast. CONTRAST:  80 mL Isovue 300 intravenous COMPARISON:  None. FINDINGS: Lower chest: Lung bases demonstrate patchy dependent atelectasis. No consolidation or effusion. Normal heart size. Hepatobiliary: Mild intra hepatic biliary dilatation. There is extrahepatic biliary dilatation with common bile duct measuring up to 15 mm. Small stones or wall calcification in the gallbladder which appears somewhat small in size. Pancreas: Slight enlargement of the proximal duct. No inflammatory changes. Spleen: 9 mm hypodense lesion in the spleen. Adrenals/Urinary Tract: Adrenal glands are unremarkable. Kidneys are normal, without renal calculi, focal lesion, or hydronephrosis. Bladder is unremarkable. Stomach/Bowel: Stomach is nonenlarged. No dilated small bowel. Diffuse colon wall thickening consistent with colitis. Mild sigmoid colon diverticular changes. Appendix is not identified with certainty. Vascular/Lymphatic: Moderate aortic atherosclerosis. No aneurysmal dilatation. No significantly enlarged lymph nodes. Reproductive: Status post hysterectomy.  No adnexal masses. Other: Negative for free air or free fluid Musculoskeletal: No acute or suspicious abnormality IMPRESSION: 1. Diffuse wall thickening of the colon consistent with pancolitis of infectious or inflammatory etiology, consider C difficile. No obstruction. No free air. 2. Mild intra hepatic and moderate extrahepatic biliary enlargement with slight enlargement of the proximal pancreatic duct. No obvious mass, however recommend correlation with LFTs and consider nonemergent MRCP evaluation 3. 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 Electronically Signed   By: Madie Reno.D.  On: 07/08/2017 01:42        Scheduled Meds: . feeding supplement (ENSURE ENLIVE)  237 mL Oral BID BM  . lisinopril  10 mg Oral Daily  . pravastatin  40 mg Oral q1800  . sodium chloride flush  3 mL Intravenous Q12H  . vancomycin  125 mg Oral QID   Continuous Infusions: . magnesium sulfate 1 - 4 g bolus IVPB       LOS: 0 days    Time spent: over 30 min    Fayrene Helper, MD [facility] Pager [phone]  If 7PM-7AM, please contact night-coverage [email or web address] Password [email or web address] 07/09/2017, 7:15 PM

## 2017-07-09 NOTE — Progress Notes (Signed)
Initial Nutrition Assessment  DOCUMENTATION CODES:   Not applicable  INTERVENTION:   Ensure Enlive po BID, each supplement provides 350 kcal and 20 grams of protein  NUTRITION DIAGNOSIS:   Inadequate oral intake related to altered GI function, diarrhea as evidenced by per patient/family report.  GOAL:   Patient will meet greater than or equal to 90% of their needs  MONITOR:   PO intake, Supplement acceptance, Diet advancement, Weight trends, Labs  REASON FOR ASSESSMENT:   Malnutrition Screening Tool    ASSESSMENT:   Patient with PMH significant for HTN. Presents this admission with complaints of abdominal pain and diarrhea. Admitted for colitis and biliary dilation.    Spoke with pt at bedside. Reports having decrease in appetite one day prior to admission after eating a sandwich from a restaurant with her daughter. Pt reports she typically eats 3 meals each day with supplementation once. Sister at bedside disagrees and wishes for RD to speak with pt's daughter who she lives with. Will attempt to see daughter if time permits. Pt is currently on full liquids. She tolerated grits, yogurt, and ice cream this morning. RD to order Ensure per pt's request.   Pt endorses a UBW of 170 lb, unsure of the last time being at that weight. Records are limited in weight history. Nutrition-Focused physical exam completed. Suspect some form of malnutrition at this time but will wait to speak with sister for more information.   Medications reviewed and include: IV abx Labs reviewed.   NUTRITION - FOCUSED PHYSICAL EXAM:    Most Recent Value  Orbital Region  Mild depletion  Upper Arm Region  Moderate depletion  Thoracic and Lumbar Region  Unable to assess  Buccal Region  Moderate depletion  Temple Region  Severe depletion  Clavicle Bone Region  Moderate depletion  Clavicle and Acromion Bone Region  Moderate depletion  Scapular Bone Region  Unable to assess  Dorsal Hand  Moderate  depletion  Patellar Region  Moderate depletion  Anterior Thigh Region  Severe depletion  Posterior Calf Region  Severe depletion  Edema (RD Assessment)  None     Diet Order:  Diet full liquid Room service appropriate? Yes with Assist; Fluid consistency: Thin  EDUCATION NEEDS:   Education needs have been addressed  Skin:  Skin Assessment: Reviewed RN Assessment  Last BM:  07/09/17  Height:   Ht Readings from Last 1 Encounters:  07/08/17 5' 1"  (1.549 m)    Weight:   Wt Readings from Last 1 Encounters:  07/09/17 113 lb 11.2 oz (51.6 kg)    Ideal Body Weight:  47.7 kg  BMI:  Body mass index is 21.48 kg/m.  Estimated Nutritional Needs:   Kcal:  1400-1600 kcal  Protein:  70-80 g  Fluid:  >1.4 L/day    Mariana Single RD, LDN Clinical Nutrition Pager # [phone]

## 2017-07-09 NOTE — Progress Notes (Signed)
Pharmacy Antibiotic Note  Donna Butler is a 83 y.o. female admitted on 07/07/2017 with diarrhea.  Pharmacy has been consulted for vancomycin dosing.  Plan: Vancomycin oral solution 125 mg PO qid x 10 days Pharmacy to sign off  Height: 5' 1"  (154.9 cm) Weight: 113 lb 11.2 oz (51.6 kg) IBW/kg (Calculated) : 47.8  Temp (24hrs), Avg:99 F (37.2 C), Min:98.9 F (37.2 C), Max:99.3 F (37.4 C)  Recent Labs  Lab 07/07/17 2103 07/08/17 0701 07/09/17 0429  WBC 10.7* 7.6  --   CREATININE 0.79 0.77 0.66    Estimated Creatinine Clearance: 42.3 mL/min (by C-G formula based on SCr of 0.66 mg/dL).    Allergies  Allergen Reactions  . Penicillins     Has patient had a PCN reaction causing immediate rash, facial/tongue/throat swelling, SOB or lightheadedness with hypotension: Unknown Has patient had a PCN reaction causing severe rash involving mucus membranes or skin necrosis: No Has patient had a PCN reaction that required hospitalization: No Has patient had a PCN reaction occurring within the last 10 years: No If all of the above answers are "NO", then may proceed with Cephalosporin use.     Antimicrobials this admission: 4/17 Cipro> 4/17 Flagyl>> 4/18 PO vanc>> Dose adjustments this admission:  Microbiology results: 4/17 C diff ag + toxin neg, PCR + 4/17 gi panel neg  Thank you for allowing pharmacy to be a part of this patient's care.  Eudelia Bunch, Pharm.D. [phone] 07/09/2017 11:18 AM

## 2017-07-10 DIAGNOSIS — A09 Infectious gastroenteritis and colitis, unspecified: Secondary | ICD-10-CM | POA: Diagnosis present

## 2017-07-10 DIAGNOSIS — E876 Hypokalemia: Secondary | ICD-10-CM | POA: Diagnosis present

## 2017-07-10 DIAGNOSIS — K838 Other specified diseases of biliary tract: Secondary | ICD-10-CM | POA: Diagnosis present

## 2017-07-10 DIAGNOSIS — I10 Essential (primary) hypertension: Secondary | ICD-10-CM | POA: Diagnosis present

## 2017-07-10 DIAGNOSIS — D7389 Other diseases of spleen: Secondary | ICD-10-CM | POA: Diagnosis present

## 2017-07-10 DIAGNOSIS — R932 Abnormal findings on diagnostic imaging of liver and biliary tract: Secondary | ICD-10-CM | POA: Diagnosis present

## 2017-07-10 DIAGNOSIS — K529 Noninfective gastroenteritis and colitis, unspecified: Secondary | ICD-10-CM

## 2017-07-10 DIAGNOSIS — R935 Abnormal findings on diagnostic imaging of other abdominal regions, including retroperitoneum: Secondary | ICD-10-CM | POA: Diagnosis present

## 2017-07-10 DIAGNOSIS — Z79899 Other long term (current) drug therapy: Secondary | ICD-10-CM | POA: Diagnosis not present

## 2017-07-10 DIAGNOSIS — E7800 Pure hypercholesterolemia, unspecified: Secondary | ICD-10-CM | POA: Diagnosis present

## 2017-07-10 DIAGNOSIS — Z880 Allergy status to penicillin: Secondary | ICD-10-CM | POA: Diagnosis not present

## 2017-07-10 LAB — COMPREHENSIVE METABOLIC PANEL
ALBUMIN: 2.5 g/dL — AB (ref 3.5–5.0)
ALK PHOS: 63 U/L (ref 38–126)
ALT: 12 U/L — ABNORMAL LOW (ref 14–54)
AST: 12 U/L — AB (ref 15–41)
Anion gap: 5 (ref 5–15)
CALCIUM: 8.4 mg/dL — AB (ref 8.9–10.3)
CHLORIDE: 110 mmol/L (ref 101–111)
CO2: 26 mmol/L (ref 22–32)
Creatinine, Ser: 0.57 mg/dL (ref 0.44–1.00)
GFR calc Af Amer: >60 mL/min (ref >60)
GFR calc non Af Amer: >60 mL/min (ref >60)
GLUCOSE: 92 mg/dL (ref 65–99)
Potassium: 3.7 mmol/L (ref 3.5–5.1)
SODIUM: 141 mmol/L (ref 135–145)
Total Bilirubin: 0.2 mg/dL — ABNORMAL LOW (ref 0.3–1.2)
Total Protein: 5.1 g/dL — ABNORMAL LOW (ref 6.5–8.1)

## 2017-07-10 LAB — CBC
HCT: 30.7 % — ABNORMAL LOW (ref 36.0–46.0)
HEMOGLOBIN: 10.3 g/dL — AB (ref 12.0–15.0)
MCH: 28.4 pg (ref 26.0–34.0)
MCHC: 33.6 g/dL (ref 30.0–36.0)
MCV: 84.6 fL (ref 78.0–100.0)
PLATELETS: 243 10*3/uL (ref 150–400)
RBC: 3.63 MIL/uL — AB (ref 3.87–5.11)
RDW: 14.3 % (ref 11.5–15.5)
WBC: 5.2 10*3/uL (ref 4.0–10.5)

## 2017-07-10 LAB — MAGNESIUM: Magnesium: 1.8 mg/dL (ref 1.7–2.4)

## 2017-07-10 LAB — GLUCOSE, CAPILLARY: GLUCOSE-CAPILLARY: 76 mg/dL (ref 65–99)

## 2017-07-10 MED ORDER — VANCOMYCIN 50 MG/ML ORAL SOLUTION: 125.0000 mg | Freq: Four times a day (QID) | ORAL | 0 refills | Status: AC

## 2017-07-10 MED ORDER — POTASSIUM CHLORIDE CRYS ER 20 MEQ PO TBCR: 20.0000 meq | EXTENDED_RELEASE_TABLET | Freq: Every day | ORAL | 0 refills | Status: DC

## 2017-07-10 NOTE — Progress Notes (Signed)
Explained to pt her co pay for vancomycin $186.45. Pt states that she do not pay anything for her medications. Also explained that she could use [facility] for Vancomycin Medications. Pt continued to state that she do not pay for her medications.

## 2017-07-10 NOTE — Discharge Summary (Signed)
Physician Discharge Summary  Donna Butler KHT:[id number] DOB: Apr 20, 1934 DOA: 07/07/2017  PCP: Jilda Panda, MD  Admit date: 07/07/2017 Discharge date: 07/10/2017  Admitted From:Home Disposition:  Home  Discharge Condition:Stable CODE STATUS:Full Diet recommendation: Heart Healthy.Soft diet  Brief/Interim Summary: 83 y.o.femalewith medical history significant forhypertension, now presenting to the emergency department for evaluation of abdominal pain and diarrhea. Patient reports that her symptoms developed little less than a week ago and she reports persistent cramping and pain in the mid and lower abdomen. She reports at least 3 episodes of diarrhea daily since the onset of symptoms. She reports some nausea with a couple episodes of nonbloody vomiting during this period as well. She has not appreciated any fever, but was seen at urgent care and noted to be febrile there. There was concern for diverticulitis clinically at that time and she was started on ciprofloxacin and Flagyl. Symptoms persisted unchanged and she now presents for evaluation in the ED. She denied any chest pain, shortness of breath, or cough. Denies any history of C. difficile colitis. C. difficile testing done here sort of positive C. difficile PCR and  Antigen.  CT abdomen and pelvis showed diffuse pancolitis consistent with inflammation .Patient was started on p.o. Vanco and she responded. Her diarrhea has stopped.  Denies any abdominal pain.  She will be discharged to home today.  She will follow-up with her PCP in a week.  Following problems were addressed during her hospitalization:  # Pancolitis  Diarrhea:  Results for C diff indeterminant.  Positive for toxigenic C diff with little to no toxin production, but clinical picture with pancolitis and diarrhea concerning for C diff.  Will d/c cipro/flagyl and start PO vancomycin.  Negative GI path panel Discussed above with ID on call. Patient denies any  abdominal pain today.  Diarrhea has stopped.  # Hypokalemia -Supplemented and corrected  #Hypertension -BP at goal -Continue lisinopril as tolerated   # Splenic lesion  - Noted incidentally on CT - Outpatient follow-up recommended (6 month MRI)  # Biliary dilation  - Incidental notation is made on CT abd/pelvis of mild intrahepatic and moderate extrahepatic biliary dilation without definite mass  - LFT's are wnl and her abdominal tenderness in mainly in lower quadrants with minimal tenderness in RUQ or epigastrium  -  MRCP as an outpatient in 6 months.    Discharge Diagnoses:  Principal Problem:   Colitis presumed infectious Active Problems:   Hypokalemia   Hypertension   Colitis   Dilation of biliary tract   Splenic lesion    Discharge Instructions  Discharge Instructions    Diet - low sodium heart healthy   Complete by:  As directed    Discharge instructions   Complete by:  As directed    1) Follow up with your PCP in a week. 2)Take prescribed medications as instructed. 3) Do a CBC and BMP  during follow-up with your PCP.   Increase activity slowly   Complete by:  As directed      Allergies as of 07/10/2017      Reactions   Penicillins    Has patient had a PCN reaction causing immediate rash, facial/tongue/throat swelling, SOB or lightheadedness with hypotension: Unknown Has patient had a PCN reaction causing severe rash involving mucus membranes or skin necrosis: No Has patient had a PCN reaction that required hospitalization: No Has patient had a PCN reaction occurring within the last 10 years: No If all of the above answers are "NO", then may  proceed with Cephalosporin use.      Medication List    STOP taking these medications   cetirizine 10 MG chewable tablet Commonly known as:  ZYRTEC   ciprofloxacin 500 MG tablet Commonly known as:  CIPRO   metroNIDAZOLE 500 MG tablet Commonly known as:  FLAGYL     TAKE these medications    lisinopril 10 MG tablet Commonly known as:  PRINIVIL,ZESTRIL Take 10 mg by mouth daily.   lovastatin 40 MG tablet Commonly known as:  MEVACOR Take 40 mg by mouth at bedtime.   potassium chloride SA 20 MEQ tablet Commonly known as:  K-DUR,KLOR-CON Take 1 tablet (20 mEq total) by mouth 2 (two) times daily for 4 days.   vancomycin 50 mg/mL oral solution Commonly known as:  VANCOCIN Take 2.5 mLs (125 mg total) by mouth 4 (four) times daily for 9 days.      Follow-up Information    Jilda Panda, MD. Schedule an appointment as soon as possible for a visit in 1 week(s).   Specialty:  Internal Medicine Contact information: [address] [address] [phone]          Allergies  Allergen Reactions  . Penicillins     Has patient had a PCN reaction causing immediate rash, facial/tongue/throat swelling, SOB or lightheadedness with hypotension: Unknown Has patient had a PCN reaction causing severe rash involving mucus membranes or skin necrosis: No Has patient had a PCN reaction that required hospitalization: No Has patient had a PCN reaction occurring within the last 10 years: No If all of the above answers are "NO", then may proceed with Cephalosporin use.     Consultations: None Procedures/Studies: Ct Abdomen Pelvis W Contrast  Result Date: 07/08/2017 CLINICAL DATA:  Lower abdominal pain and diarrhea EXAM: CT ABDOMEN AND PELVIS WITH CONTRAST TECHNIQUE: Multidetector CT imaging of the abdomen and pelvis was performed using the standard protocol following bolus administration of intravenous contrast. CONTRAST:  80 mL Isovue 300 intravenous COMPARISON:  None. FINDINGS: Lower chest: Lung bases demonstrate patchy dependent atelectasis. No consolidation or effusion. Normal heart size. Hepatobiliary: Mild intra hepatic biliary dilatation. There is extrahepatic biliary dilatation with common bile duct measuring up to 15 mm. Small stones or wall calcification in the  gallbladder which appears somewhat small in size. Pancreas: Slight enlargement of the proximal duct. No inflammatory changes. Spleen: 9 mm hypodense lesion in the spleen. Adrenals/Urinary Tract: Adrenal glands are unremarkable. Kidneys are normal, without renal calculi, focal lesion, or hydronephrosis. Bladder is unremarkable. Stomach/Bowel: Stomach is nonenlarged. No dilated small bowel. Diffuse colon wall thickening consistent with colitis. Mild sigmoid colon diverticular changes. Appendix is not identified with certainty. Vascular/Lymphatic: Moderate aortic atherosclerosis. No aneurysmal dilatation. No significantly enlarged lymph nodes. Reproductive: Status post hysterectomy. No adnexal masses. Other: Negative for free air or free fluid Musculoskeletal: No acute or suspicious abnormality IMPRESSION: 1. Diffuse wall thickening of the colon consistent with pancolitis of infectious or inflammatory etiology, consider C difficile. No obstruction. No free air. 2. Mild intra hepatic and moderate extrahepatic biliary enlargement with slight enlargement of the proximal pancreatic duct. No obvious mass, however recommend correlation with LFTs and consider nonemergent MRCP evaluation 3. 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 Electronically Signed   By: Donavan Foil M.D.   On: 07/08/2017 01:42    (Echo, Carotid, EGD, Colonoscopy, ERCP)    Subjective: Patient seen and examined the bedside this morning.  Remains comfortable. Diarrhoea has stopped.  Abdomen pain  has resolved.  Stable for discharge home. Discharge Exam: Vitals:   07/09/17 2108 07/10/17 0549  BP: 134/80 138/84  Pulse: 81 79  Resp: 18 20  Temp: 99.6 F (37.6 C) 98.3 F (36.8 C)  SpO2: 100% 100%   Vitals:   07/09/17 1043 07/09/17 1401 07/09/17 2108 07/10/17 0549  BP: (!) 110/57 134/76 134/80 138/84  Pulse: 64 78 81 79  Resp:  20 18 20   Temp:  98.1 F (36.7 C) 99.6 F (37.6 C) 98.3 F (36.8 C)  TempSrc:   Oral Oral Oral  SpO2: 99% 100% 100% 100%  Weight:    49.5 kg (109 lb 2 oz)  Height:        General: Pt is alert, awake, not in acute distress Cardiovascular: RRR, S1/S2 +, no rubs, no gallops Respiratory: CTA bilaterally, no wheezing, no rhonchi Abdominal: Soft, NT, ND, bowel sounds + Extremities: no edema, no cyanosis    The results of significant diagnostics from this hospitalization (including imaging, microbiology, ancillary and laboratory) are listed below for reference.     Microbiology: Recent Results (from the past 240 hour(s))  Gastrointestinal Panel by PCR , Stool     Status: None   Collection Time: 07/08/17  9:21 AM  Result Value Ref Range Status   Campylobacter species NOT DETECTED NOT DETECTED Final   Plesimonas shigelloides NOT DETECTED NOT DETECTED Final   Salmonella species NOT DETECTED NOT DETECTED Final   Yersinia enterocolitica NOT DETECTED NOT DETECTED Final   Vibrio species NOT DETECTED NOT DETECTED Final   Vibrio cholerae NOT DETECTED NOT DETECTED Final   Enteroaggregative E coli (EAEC) NOT DETECTED NOT DETECTED Final   Enteropathogenic E coli (EPEC) NOT DETECTED NOT DETECTED Final   Enterotoxigenic E coli (ETEC) NOT DETECTED NOT DETECTED Final   Shiga like toxin producing E coli (STEC) NOT DETECTED NOT DETECTED Final   Shigella/Enteroinvasive E coli (EIEC) NOT DETECTED NOT DETECTED Final   Cryptosporidium NOT DETECTED NOT DETECTED Final   Cyclospora cayetanensis NOT DETECTED NOT DETECTED Final   Entamoeba histolytica NOT DETECTED NOT DETECTED Final   Giardia lamblia NOT DETECTED NOT DETECTED Final   Adenovirus F40/41 NOT DETECTED NOT DETECTED Final   Astrovirus NOT DETECTED NOT DETECTED Final   Norovirus GI/GII NOT DETECTED NOT DETECTED Final   Rotavirus A NOT DETECTED NOT DETECTED Final   Sapovirus (I, II, IV, and V) NOT DETECTED NOT DETECTED Final    Comment: Performed at [facility], [address] [place] [place], [place] [address]  C  difficile quick scan w PCR reflex     Status: Abnormal   Collection Time: 07/08/17  9:21 AM  Result Value Ref Range Status   C Diff antigen POSITIVE (A) NEGATIVE Final   C Diff toxin NEGATIVE NEGATIVE Final   C Diff interpretation Results are indeterminate. See PCR results.  Final    Comment: Performed at [facility], [place] [address] [place], [place] [address]  C. Diff by PCR, Reflexed     Status: Abnormal   Collection Time: 07/08/17  9:21 AM  Result Value Ref Range Status   Toxigenic C. Difficile by PCR POSITIVE (A) NEGATIVE Final    Comment: Positive for toxigenic C. difficile with little to no toxin production. Only treat if clinical presentation suggests symptomatic illness. Performed at [facility], [place] [address] [place], [place] [address]      Labs: BNP (last 3 results) No results for input(s): BNP in the last 8760 hours. Basic  Metabolic Panel: Recent Labs  Lab 07/07/17 2103 07/08/17 0701 07/09/17 0429 07/10/17 0444  NA 137 139 144 141  K 2.9* 3.3* 4.1 3.7  CL 103 104 112* 110  CO2 25 24 24 26   GLUCOSE 96 90 95 92  BUN 13 9 5* <5*  CREATININE 0.79 0.77 0.66 0.57  CALCIUM 8.6* 8.7* 9.0 8.4*  MG  --  1.8  --  1.8   Liver Function Tests: Recent Labs  Lab 07/07/17 2103 07/08/17 0701 07/10/17 0444  AST 13* 15 12*  ALT 13* 15 12*  ALKPHOS 86 77 63  BILITOT 0.6 0.6 0.2*  PROT 6.3* 6.1* 5.1*  ALBUMIN 3.2* 2.9* 2.5*   Recent Labs  Lab 07/07/17 2103  LIPASE 18   No results for input(s): AMMONIA in the last 168 hours. CBC: Recent Labs  Lab 07/07/17 2103 07/08/17 0701 07/10/17 0444  WBC 10.7* 7.6 5.2  NEUTROABS  --  5.5  --   HGB 11.4* 11.1* 10.3*  HCT 34.5* 33.6* 30.7*  MCV 84.4 84.2 84.6  PLT 269 234 243   Cardiac Enzymes: No results for input(s): CKTOTAL, CKMB, CKMBINDEX, TROPONINI in the last 168 hours. BNP: Invalid input(s): POCBNP CBG: Recent Labs  Lab 07/08/17 0800 07/09/17 0749 07/10/17 0801  GLUCAP 66 85 76    D-Dimer No results for input(s): DDIMER in the last 72 hours. Hgb A1c No results for input(s): HGBA1C in the last 72 hours. Lipid Profile No results for input(s): CHOL, HDL, LDLCALC, TRIG, CHOLHDL, LDLDIRECT in the last 72 hours. Thyroid function studies No results for input(s): TSH, T4TOTAL, T3FREE, THYROIDAB in the last 72 hours.  Invalid input(s): FREET3 Anemia work up No results for input(s): VITAMINB12, FOLATE, FERRITIN, TIBC, IRON, RETICCTPCT in the last 72 hours. Urinalysis    Component Value Date/Time   COLORURINE AMBER (A) 07/07/2017 2229   APPEARANCEUR CLEAR 07/07/2017 2229   LABSPEC 1.023 07/07/2017 2229   PHURINE 6.0 07/07/2017 2229   GLUCOSEU NEGATIVE 07/07/2017 2229   HGBUR NEGATIVE 07/07/2017 2229   BILIRUBINUR NEGATIVE 07/07/2017 2229   KETONESUR 5 (A) 07/07/2017 2229   PROTEINUR 100 (A) 07/07/2017 2229   UROBILINOGEN 0.2 07/06/2017 1852   NITRITE NEGATIVE 07/07/2017 2229   LEUKOCYTESUR TRACE (A) 07/07/2017 2229   Sepsis Labs Invalid input(s): PROCALCITONIN,  WBC,  LACTICIDVEN Microbiology Recent Results (from the past 240 hour(s))  Gastrointestinal Panel by PCR , Stool     Status: None   Collection Time: 07/08/17  9:21 AM  Result Value Ref Range Status   Campylobacter species NOT DETECTED NOT DETECTED Final   Plesimonas shigelloides NOT DETECTED NOT DETECTED Final   Salmonella species NOT DETECTED NOT DETECTED Final   Yersinia enterocolitica NOT DETECTED NOT DETECTED Final   Vibrio species NOT DETECTED NOT DETECTED Final   Vibrio cholerae NOT DETECTED NOT DETECTED Final   Enteroaggregative E coli (EAEC) NOT DETECTED NOT DETECTED Final   Enteropathogenic E coli (EPEC) NOT DETECTED NOT DETECTED Final   Enterotoxigenic E coli (ETEC) NOT DETECTED NOT DETECTED Final   Shiga like toxin producing E coli (STEC) NOT DETECTED NOT DETECTED Final   Shigella/Enteroinvasive E coli (EIEC) NOT DETECTED NOT DETECTED Final   Cryptosporidium NOT DETECTED NOT DETECTED  Final   Cyclospora cayetanensis NOT DETECTED NOT DETECTED Final   Entamoeba histolytica NOT DETECTED NOT DETECTED Final   Giardia lamblia NOT DETECTED NOT DETECTED Final   Adenovirus F40/41 NOT DETECTED NOT DETECTED Final   Astrovirus NOT DETECTED NOT DETECTED Final   Norovirus  GI/GII NOT DETECTED NOT DETECTED Final   Rotavirus A NOT DETECTED NOT DETECTED Final   Sapovirus (I, II, IV, and V) NOT DETECTED NOT DETECTED Final    Comment: Performed at [facility], [address] [place], [place] [address]  C difficile quick scan w PCR reflex     Status: Abnormal   Collection Time: 07/08/17  9:21 AM  Result Value Ref Range Status   C Diff antigen POSITIVE (A) NEGATIVE Final   C Diff toxin NEGATIVE NEGATIVE Final   C Diff interpretation Results are indeterminate. See PCR results.  Final    Comment: Performed at [facility], [place] [address], [place], [place] [address]  C. Diff by PCR, Reflexed     Status: Abnormal   Collection Time: 07/08/17  9:21 AM  Result Value Ref Range Status   Toxigenic C. Difficile by PCR POSITIVE (A) NEGATIVE Final    Comment: Positive for toxigenic C. difficile with little to no toxin production. Only treat if clinical presentation suggests symptomatic illness. Performed at [facility], [place] [address] [place], [place] [address]      Time coordinating discharge: 35 minutes  SIGNED:   Shelly Coss, MD  [facility] 07/10/2017, 2:05 PM Pager [phone]  If 7PM-7AM, please contact night-coverage [email or web address] Password [email or web address]

## 2017-07-10 NOTE — Progress Notes (Signed)
Discharge instructions and medications discussed with patient.  Prescriptions and AVS given to patient. All questions answered.

## 2017-07-10 NOTE — Progress Notes (Signed)
PT Cancellation Note  Patient Details Name: THEO REITHER MRN: [id number] DOB: Jun 07, 1934   Cancelled Treatment:    Reason Eval/Treat Not Completed: PT screened, no needs identified, will sign off. Spoke with pt who denied need for PT services. Also spoke with RN who stated pt did not need to see PT before discharge. Will sign off.   Weston Anna, MPT Pager: [phone]

## 2018-03-24 DEATH — deceased

## 2018-05-13 ENCOUNTER — Other Ambulatory Visit: Payer: Self-pay | Admitting: Internal Medicine

## 2018-05-13 DIAGNOSIS — R4189 Other symptoms and signs involving cognitive functions and awareness: Secondary | ICD-10-CM

## 2018-05-13 DIAGNOSIS — R413 Other amnesia: Secondary | ICD-10-CM

## 2018-05-18 ENCOUNTER — Other Ambulatory Visit: Payer: Medicare HMO

## 2018-05-28 ENCOUNTER — Ambulatory Visit
Admission: RE | Admit: 2018-05-28 | Discharge: 2018-05-28 | Disposition: A | Discharge status: Home / Self Care | Payer: Medicare HMO | Source: Ambulatory Visit | Attending: Internal Medicine | Admitting: Internal Medicine

## 2018-05-28 DIAGNOSIS — R413 Other amnesia: Secondary | ICD-10-CM

## 2018-05-28 DIAGNOSIS — R4189 Other symptoms and signs involving cognitive functions and awareness: Secondary | ICD-10-CM

## 2018-06-16 ENCOUNTER — Encounter (HOSPITAL_COMMUNITY): Payer: Self-pay | Admitting: Emergency Medicine

## 2018-06-16 ENCOUNTER — Other Ambulatory Visit: Payer: Self-pay

## 2018-06-16 ENCOUNTER — Ambulatory Visit (HOSPITAL_COMMUNITY)
Admission: EM | Admit: 2018-06-16 | Discharge: 2018-06-16 | Disposition: A | Discharge status: Home / Self Care | Payer: Medicare HMO | Attending: Family Medicine | Admitting: Family Medicine

## 2018-06-16 DIAGNOSIS — R1010 Upper abdominal pain, unspecified: Secondary | ICD-10-CM

## 2018-06-16 DIAGNOSIS — I10 Essential (primary) hypertension: Secondary | ICD-10-CM

## 2018-06-16 LAB — POCT URINALYSIS DIP (DEVICE)
BILIRUBIN URINE: NEGATIVE
Glucose, UA: NEGATIVE mg/dL
HGB URINE DIPSTICK: NEGATIVE
Ketones, ur: NEGATIVE mg/dL
NITRITE: NEGATIVE
PH: 6 (ref 5.0–8.0)
Protein, ur: NEGATIVE mg/dL
Specific Gravity, Urine: <=1.005 (ref 1.005–1.030)
UROBILINOGEN UA: 0.2 mg/dL (ref 0.0–1.0)

## 2018-06-16 NOTE — Discharge Instructions (Addendum)
Urine did not show signs of infection Try Tums, maalox, pepcid or zantac when having symptoms to see if related to reflux if symptoms worsening after eating/meals Continue to monitor for normal bowel movements  Follow up if pain worsening, developing fever, shortness of breath, chest pain, vomiting, unable to eat or drink

## 2018-06-16 NOTE — ED Triage Notes (Signed)
Pt complains of lower abdominal pain x2-3 days.  She states the pain feels like she has to urinate.  Pt denies any urinary issues, states she is urinating fine.  She denies any fever or body aches.

## 2018-06-16 NOTE — ED Provider Notes (Addendum)
[facility]    CSN: [id number] Arrival date & time: 06/16/18  1837     History   Chief Complaint Chief Complaint  Patient presents with  . Abdominal Pain    HPI Donna Butler is a 84 y.o. female history of hypertension, hyperlipidemia, previous colitis, previous hysterectomy presenting today for evaluation of abdominal pain.  Patient states that she has had lower abdominal pain over the past couple days.  She noticed an episode yesterday after eating lunch where she had some discomfort.  She felt as if she needed to use the restroom.  Initially describing this as needing to urinate, but later describing it as needing to have a bowel movement.  She denies any changes in her bowels.  Denies diarrhea or constipation.  Typically has daily or every other day bowel movements.  Does admit to passing gas.  Denies blood in the stool.  Denies nausea or vomiting.  Denies dysuria or increased frequency except for with drinking coffee.  Denies hematuria.  Denies back pain.  She currently denies the pain at present, states that she "feels good".  Pain lasted a few hours yesterday.  States that the pain was different than when she previously had colitis approximately 1 year ago.  She did not take anything for her symptoms.  Denies chest pain or shortness of breath.  Does admit she had a filmy sensation in her mouth.  Denies any fevers, cough congestion or sore throat.  Tolerating normal oral intake.  HPI  Past Medical History:  Diagnosis Date  . High cholesterol   . Hypertension     Patient Active Problem List   Diagnosis Date Noted  . Colitis presumed infectious 07/08/2017  . Hypokalemia 07/08/2017  . Hypertension 07/08/2017  . Colitis 07/08/2017  . Dilation of biliary tract 07/08/2017  . Splenic lesion 07/08/2017    Past Surgical History:  Procedure Laterality Date  . ABDOMINAL HYSTERECTOMY      OB History   No obstetric history on file.      Home Medications     Prior to Admission medications   Medication Sig Start Date End Date Taking? Authorizing Provider  lisinopril (PRINIVIL,ZESTRIL) 10 MG tablet Take 10 mg by mouth daily.   Yes [provider]  lovastatin (MEVACOR) 40 MG tablet Take 40 mg by mouth at bedtime.   Yes [provider]  potassium chloride SA (K-DUR,KLOR-CON) 20 MEQ tablet Take 1 tablet (20 mEq total) by mouth daily for 4 days. 07/10/17 07/14/17  Shelly Coss, MD    Family History History reviewed. No pertinent family history.  Social History Social History   Tobacco Use  . Smoking status: Never Smoker  . Smokeless tobacco: Never Used  Substance Use Topics  . Alcohol use: No  . Drug use: No     Allergies   Penicillins   Review of Systems Review of Systems  Constitutional: Negative for activity change, appetite change, chills, fatigue and fever.  HENT: Negative for congestion, ear pain, rhinorrhea, sinus pressure, sore throat and trouble swallowing.   Eyes: Negative for discharge and redness.  Respiratory: Negative for cough, chest tightness and shortness of breath.   Cardiovascular: Negative for chest pain.  Gastrointestinal: Positive for abdominal pain. Negative for diarrhea, nausea and vomiting.  Genitourinary: Negative for difficulty urinating, dysuria, frequency, pelvic pain, urgency, vaginal discharge and vaginal pain.  Musculoskeletal: Negative for myalgias.  Skin: Negative for rash.  Neurological: Negative for dizziness, light-headedness and headaches.     Physical  Exam Triage Vital Signs ED Triage Vitals  Enc Vitals Group     BP 06/16/18 1900 (!) 173/94     Pulse Rate 06/16/18 1900 87     Resp --      Temp 06/16/18 1900 98.5 F (36.9 C)     Temp Source 06/16/18 1900 Oral     SpO2 06/16/18 1900 95 %     Weight --      Height --      Head Circumference --      Peak Flow --      Pain Score 06/16/18 1858 6     Pain Loc --      Pain Edu? --      Excl. in [place]? --    No data  found.  Updated Vital Signs BP (!) 173/94 (BP Location: Right Arm)   Pulse 87   Temp 98.5 F (36.9 C) (Oral)   SpO2 95%   Visual Acuity Right Eye Distance:   Left Eye Distance:   Bilateral Distance:    Right Eye Near:   Left Eye Near:    Bilateral Near:     Physical Exam Vitals signs and nursing note reviewed.  Constitutional:      General: She is not in acute distress.    Appearance: She is well-developed.  HENT:     Head: Normocephalic and atraumatic.  Eyes:     Conjunctiva/sclera: Conjunctivae normal.  Neck:     Musculoskeletal: Neck supple.  Cardiovascular:     Rate and Rhythm: Normal rate and regular rhythm.     Heart sounds: No murmur.  Pulmonary:     Effort: Pulmonary effort is normal. No respiratory distress.     Breath sounds: Normal breath sounds.  Abdominal:     Palpations: Abdomen is soft.     Tenderness: There is no abdominal tenderness.     Comments: Well-healed lower abdominal midline surgical scar from previous hysterectomy; abdomen soft, nondistended, nontender to bilateral lower quadrants and suprapubic area, mild tenderness to left upper, right upper and epigastrium.  Negative rebound, negative Rovsing, negative McBurney's, negative Murphy's.  Patient moving around room easily and changing positions easily.  Skin:    General: Skin is warm and dry.  Neurological:     Mental Status: She is alert.      UC Treatments / Results  Labs (all labs ordered are listed, but only abnormal results are displayed) Labs Reviewed  POCT URINALYSIS DIP (DEVICE) - Abnormal; Notable for the following components:      Result Value   Leukocytes,Ua SMALL (*)    All other components within normal limits  URINE CULTURE    EKG None  Radiology No results found.  Procedures Procedures (including critical care time)  Medications Ordered in UC Medications - No data to display  Initial Impression / Assessment and Plan / UC Course  I have reviewed the triage  vital signs and the nursing notes.  Pertinent labs & imaging results that were available during my care of the patient were reviewed by me and considered in my medical decision making (see chart for details).     Vital signs stable, blood pressure slightly elevated, denies current pain.  UA with small leuks, but the rest of UA completely negative.  Feel UTI is less likely.  Will send for culture to confirm.  Patient seems to have had one incident yesterday with resolution, improved today.  Symptoms less likely cardiac etiology, given present after eating and lacking  chest pain or shortness of breath, without nausea or vomiting, patient without pain at time of visit.  Did discuss with patient monitoring for symptoms to return given her age presentation may be atypical.  Possible dyspepsia versus constipation.  Deferring abdominal x-ray as bowel obstruction less likely-patient is tolerating oral intake and moving bowel movements normally.  We will continue to monitor, follow-up with PCP if persisting, follow-up in ED if worsening or developing new symptoms.  Discussed strict return precautions. Patient verbalized understanding and is agreeable with plan.  Final Clinical Impressions(s) / UC Diagnoses   Final diagnoses:  Pain of upper abdomen     Discharge Instructions     Urine did not show signs of infection Try Tums, maalox, pepcid or zantac when having symptoms to see if related to reflux if symptoms worsening after eating/meals Continue to monitor for normal bowel movements  Follow up if pain worsening, developing fever, shortness of breath, chest pain, vomiting, unable to eat or drink   ED Prescriptions    None     Controlled Substance Prescriptions [place] Controlled Substance Registry consulted? Not Applicable   Janith Lima, PA-C 06/16/18 1948    Janith Lima, [place] 06/16/18 1953

## 2018-06-17 LAB — URINE CULTURE

## 2018-06-24 ENCOUNTER — Ambulatory Visit (HOSPITAL_COMMUNITY)
Admission: EM | Admit: 2018-06-24 | Discharge: 2018-06-24 | Disposition: A | Discharge status: Home / Self Care | Payer: Medicare HMO | Attending: Family Medicine | Admitting: Family Medicine

## 2018-06-24 ENCOUNTER — Ambulatory Visit (INDEPENDENT_AMBULATORY_CARE_PROVIDER_SITE_OTHER): Payer: Medicare HMO

## 2018-06-24 ENCOUNTER — Encounter (HOSPITAL_COMMUNITY): Payer: Self-pay

## 2018-06-24 ENCOUNTER — Other Ambulatory Visit: Payer: Self-pay

## 2018-06-24 DIAGNOSIS — R109 Unspecified abdominal pain: Secondary | ICD-10-CM | POA: Diagnosis not present

## 2018-06-24 DIAGNOSIS — R1031 Right lower quadrant pain: Secondary | ICD-10-CM | POA: Diagnosis present

## 2018-06-24 DIAGNOSIS — R1032 Left lower quadrant pain: Secondary | ICD-10-CM

## 2018-06-24 LAB — CBC WITH DIFFERENTIAL/PLATELET
Abs Immature Granulocytes: 0.01 10*3/uL (ref 0.00–0.07)
Basophils Absolute: 0 10*3/uL (ref 0.0–0.1)
Basophils Relative: 0 %
Eosinophils Absolute: 0 10*3/uL (ref 0.0–0.5)
Eosinophils Relative: 1 %
HCT: 36.4 % (ref 36.0–46.0)
Hemoglobin: 11.9 g/dL — ABNORMAL LOW (ref 12.0–15.0)
Immature Granulocytes: 0 %
Lymphocytes Relative: 25 %
Lymphs Abs: 1.5 10*3/uL (ref 0.7–4.0)
MCH: 27.9 pg (ref 26.0–34.0)
MCHC: 32.7 g/dL (ref 30.0–36.0)
MCV: 85.2 fL (ref 80.0–100.0)
Monocytes Absolute: 0.5 10*3/uL (ref 0.1–1.0)
Monocytes Relative: 9 %
Neutro Abs: 3.8 10*3/uL (ref 1.7–7.7)
Neutrophils Relative %: 65 %
Platelets: 218 10*3/uL (ref 150–400)
RBC: 4.27 MIL/uL (ref 3.87–5.11)
RDW: 14.5 % (ref 11.5–15.5)
WBC: 6 10*3/uL (ref 4.0–10.5)
nRBC: 0 % (ref 0.0–0.2)

## 2018-06-24 NOTE — ED Notes (Signed)
Patient verbalizes understanding of discharge instructions. Opportunity for questioning and answers were provided. Patient discharged from [facility] by MD.

## 2018-06-24 NOTE — Discharge Instructions (Addendum)
You may try taking over the counter MIRALAX for the next 2-3 days. This should help you move your bowels and potentially ease your lower abdominal discomfort.

## 2018-06-29 NOTE — ED Provider Notes (Signed)
[facility]   [id number] 06/24/18 Arrival Time: 1700  ASSESSMENT & PLAN:  1. Bilateral lower abdominal discomfort    Reports that her BP has been running higher than normal lately. Encouraged her to schedule prompt f/u with her PCP. May return here for recheck of BP if needed.  Benign abdominal exam. No indications for urgent abdominal/pelvic imaging at this time. No suspicion for SBO. Discussed possibility that her current symptoms may be related to constipation. CBC normal except for hemoglobin of 11.9.   Discharge Instructions     You may try taking over the counter MIRALAX for the next 2-3 days. This should help you move your bowels and potentially ease your lower abdominal discomfort.   Ensure adequate fluid intake.  Follow-up Information    Schedule an appointment as soon as possible for a visit  with Jilda Panda, MD.   Specialty:  Internal Medicine Contact information: [id number] [place] [address] [phone]          Agrees to proceed to the ED should her symptoms worsen in any way.  Reviewed expectations re: course of current medical issues. Questions answered. Outlined signs and symptoms indicating need for more acute intervention. Patient verbalized understanding. After Visit Summary given.   SUBJECTIVE: History from: patient. Donna Butler is a 84 y.o. female who presents with complaint of intermittent generalized abdominal discomfort. "Not really a pain, just uncomfortable." Onset gradual, noticed over this past week. Discomfort described as "full feeling"; without radiation. Symptoms are stable since beginning. Fever: absent. Aggravating factors: have not been identified. Alleviating factors: have not been identified. She denies arthralgias, belching, headache, myalgias, nausea, sweats and vomiting. Appetite: normal. PO intake: normal. Ambulatory without assistance. Urinary symptoms: none. Bowel movements: are less frequent than before;  last bowel movement 2-3 days ago "and smaller than usual"; without blood. Is passing gas from rectum. No h/o recurrent abdominal pain. OTC treatment: none reported.  No LMP recorded. Patient has had a hysterectomy.   Past Surgical History:  Procedure Laterality Date  . ABDOMINAL HYSTERECTOMY     ROS: As per HPI. All other systems negative.  OBJECTIVE:  Vitals:   06/24/18 1711 06/24/18 1714  BP: (!) 190/82   Pulse: 97   Temp: 99.8 F (37.7 C)   TempSrc: Oral   SpO2: 97% 97%    General appearance: alert, oriented, no acute distress Lungs: clear to auscultation bilaterally; unlabored respirations Heart: regular rate and rhythm Abdomen: soft; without distention; mild and poorly localized tenderness over her lower abdomen; "feels full when you push on it"; normal bowel sounds; without masses or organomegaly; without guarding or rebound tenderness Back: without CVA tenderness; FROM at waist Extremities: without LE edema; symmetrical; without gross deformities Skin: warm and dry Neurologic: normal gait Psychological: alert and cooperative; normal mood and affect  Labs: Results for orders placed or performed during the hospital encounter of 06/24/18  CBC with Differential  Result Value Ref Range   WBC 6.0 4.0 - 10.5 K/uL   RBC 4.27 3.87 - 5.11 MIL/uL   Hemoglobin 11.9 (L) 12.0 - 15.0 g/dL   HCT 36.4 36.0 - 46.0 %   MCV 85.2 80.0 - 100.0 fL   MCH 27.9 26.0 - 34.0 pg   MCHC 32.7 30.0 - 36.0 g/dL   RDW 14.5 11.5 - 15.5 %   Platelets 218 150 - 400 K/uL   nRBC 0.0 0.0 - 0.2 %   Neutrophils Relative % 65 %   Neutro Abs 3.8 1.7 -  7.7 K/uL   Lymphocytes Relative 25 %   Lymphs Abs 1.5 0.7 - 4.0 K/uL   Monocytes Relative 9 %   Monocytes Absolute 0.5 0.1 - 1.0 K/uL   Eosinophils Relative 1 %   Eosinophils Absolute 0.0 0.0 - 0.5 K/uL   Basophils Relative 0 %   Basophils Absolute 0.0 0.0 - 0.1 K/uL   Immature Granulocytes 0 %   Abs Immature Granulocytes 0.01 0.00 - 0.07 K/uL    Labs Reviewed  CBC WITH DIFFERENTIAL/PLATELET - Abnormal; Notable for the following components:      Result Value   Hemoglobin 11.9 (*)    All other components within normal limits    Allergies  Allergen Reactions  . Penicillins     Has patient had a PCN reaction causing immediate rash, facial/tongue/throat swelling, SOB or lightheadedness with hypotension: Unknown Has patient had a PCN reaction causing severe rash involving mucus membranes or skin necrosis: No Has patient had a PCN reaction that required hospitalization: No Has patient had a PCN reaction occurring within the last 10 years: No If all of the above answers are "NO", then may proceed with Cephalosporin use.                                                Past Medical History:  Diagnosis Date  . High cholesterol   . Hypertension    Social History   Socioeconomic History  . Marital status: Widowed    Spouse name: Not on file  . Number of children: Not on file  . Years of education: Not on file  . Highest education level: Not on file  Occupational History  . Not on file  Social Needs  . Financial resource strain: Not on file  . Food insecurity:    Worry: Not on file    Inability: Not on file  . Transportation needs:    Medical: Not on file    Non-medical: Not on file  Tobacco Use  . Smoking status: Never Smoker  . Smokeless tobacco: Never Used  Substance and Sexual Activity  . Alcohol use: No  . Drug use: No  . Sexual activity: Not on file  Lifestyle  . Physical activity:    Days per week: Not on file    Minutes per session: Not on file  . Stress: Not on file  Relationships  . Social connections:    Talks on phone: Not on file    Gets together: Not on file    Attends religious service: Not on file    Active member of club or organization: Not on file    Attends meetings of clubs or organizations: Not on file    Relationship status: Not on file  . Intimate partner violence:    Fear of current or  ex partner: Not on file    Emotionally abused: Not on file    Physically abused: Not on file    Forced sexual activity: Not on file  Other Topics Concern  . Not on file  Social History Narrative  . Not on file      Vanessa Kick, MD 06/30/18 1048

## 2018-09-16 ENCOUNTER — Other Ambulatory Visit: Payer: Self-pay | Admitting: Internal Medicine

## 2018-09-16 DIAGNOSIS — Z1231 Encounter for screening mammogram for malignant neoplasm of breast: Secondary | ICD-10-CM

## 2018-10-29 ENCOUNTER — Ambulatory Visit
Admission: RE | Admit: 2018-10-29 | Discharge: 2018-10-29 | Disposition: A | Discharge status: Home / Self Care | Payer: Medicare HMO | Source: Ambulatory Visit | Attending: Internal Medicine | Admitting: Internal Medicine

## 2018-10-29 ENCOUNTER — Other Ambulatory Visit: Payer: Self-pay

## 2018-10-29 DIAGNOSIS — Z1231 Encounter for screening mammogram for malignant neoplasm of breast: Secondary | ICD-10-CM

## 2018-11-21 ENCOUNTER — Encounter (HOSPITAL_COMMUNITY): Payer: Self-pay | Admitting: Emergency Medicine

## 2018-11-21 ENCOUNTER — Ambulatory Visit (HOSPITAL_COMMUNITY)
Admission: EM | Admit: 2018-11-21 | Discharge: 2018-11-21 | Disposition: A | Discharge status: Home / Self Care | Payer: Medicare HMO | Attending: Urgent Care | Admitting: Urgent Care

## 2018-11-21 ENCOUNTER — Other Ambulatory Visit: Payer: Self-pay

## 2018-11-21 DIAGNOSIS — R1030 Lower abdominal pain, unspecified: Secondary | ICD-10-CM

## 2018-11-21 DIAGNOSIS — K5900 Constipation, unspecified: Secondary | ICD-10-CM

## 2018-11-21 DIAGNOSIS — I10 Essential (primary) hypertension: Secondary | ICD-10-CM

## 2018-11-21 MED ORDER — FLEET ENEMA 7-19 GM/118ML RE ENEM: 1.0000 | ENEMA | Freq: Every day | RECTAL | 0 refills | Status: DC | PRN

## 2018-11-21 MED ORDER — DOCUSATE SODIUM 50 MG PO CAPS: 50.0000 mg | ORAL_CAPSULE | Freq: Two times a day (BID) | ORAL | 0 refills | Status: DC

## 2018-11-21 NOTE — ED Provider Notes (Signed)
MRN: [id number] DOB: August 26, 1934  Subjective:   Donna Butler is a 84 y.o. female presenting for 1 day history of recurrent belly pain. Has had recent difficulty in the past few years with having full bowel movements. Last BM was yesterday.  Patient does have a PCP, had an MRI in April 2019.  It was significant for an indeterminate lesion of the spleen, pancolitis, mild intrahepatic and moderate extra hepatic biliary enlargement.  She has not had repeat imaging.  Today, she reports that she has not followed up with her PCP about her constipation.  Last office visit with [place] in this clinic was 06/2018, patient was prescribed MiraLAX for constipation, abdominal x-ray was normal.  Patient admits unhealthy diet including foods such as sausage, ice cream, donuts and pastry.  She does not hydrate well on does not eat much fiber.  Would like recommendations on how to change her diet.  Denies fever, sore throat, cough, chest pain, shortness of breath, nausea, vomiting, blood in her stools, dysuria, hematuria.  Patient is still able to pass gas and is able to eat and still has her appetite, denies weight loss.   No current facility-administered medications for this encounter.   Current Outpatient Medications:  .  lisinopril (PRINIVIL,ZESTRIL) 10 MG tablet, Take 10 mg by mouth daily., Disp: , Rfl:  .  lovastatin (MEVACOR) 40 MG tablet, Take 40 mg by mouth at bedtime., Disp: , Rfl:  .  omeprazole (PRILOSEC) 40 MG capsule, Take 40 mg by mouth daily., Disp: , Rfl:  .  potassium chloride SA (K-DUR,KLOR-CON) 20 MEQ tablet, Take 1 tablet (20 mEq total) by mouth daily for 4 days., Disp: 4 tablet, Rfl: 0   Allergies  Allergen Reactions  . Penicillins     Has patient had a PCN reaction causing immediate rash, facial/tongue/throat swelling, SOB or lightheadedness with hypotension: Unknown Has patient had a PCN reaction causing severe rash involving mucus membranes or skin necrosis: No Has patient had a PCN  reaction that required hospitalization: No Has patient had a PCN reaction occurring within the last 10 years: No If all of the above answers are "NO", then may proceed with Cephalosporin use.     Past Medical History:  Diagnosis Date  . High cholesterol   . Hypertension      Past Surgical History:  Procedure Laterality Date  . ABDOMINAL HYSTERECTOMY      ROS As above.  Objective:   Vitals: BP (!) 155/82 (BP Location: Right Arm)   Pulse 83   Temp 98.6 F (37 C) (Oral)   Resp 18   SpO2 100%   Physical Exam Constitutional:      General: She is not in acute distress.    Appearance: Normal appearance. She is well-developed and normal weight. She is not ill-appearing, toxic-appearing or diaphoretic.  HENT:     Head: Normocephalic and atraumatic.     Right Ear: External ear normal.     Left Ear: External ear normal.     Nose: Nose normal.     Mouth/Throat:     Mouth: Mucous membranes are moist.     Pharynx: Oropharynx is clear.  Eyes:     General: No scleral icterus.    Extraocular Movements: Extraocular movements intact.     Pupils: Pupils are equal, round, and reactive to light.  Cardiovascular:     Rate and Rhythm: Normal rate and regular rhythm.     Pulses: Normal pulses.     Heart sounds: Normal heart  sounds. No murmur. No friction rub. No gallop.   Pulmonary:     Effort: Pulmonary effort is normal. No respiratory distress.     Breath sounds: Normal breath sounds. No stridor. No wheezing, rhonchi or rales.  Abdominal:     General: Bowel sounds are normal. There is no distension.     Palpations: Abdomen is soft. There is no mass.     Tenderness: There is no abdominal tenderness. There is no right CVA tenderness, left CVA tenderness, guarding or rebound.  Skin:    General: Skin is warm and dry.     Coloration: Skin is not pale.     Findings: No rash.  Neurological:     General: No focal deficit present.     Mental Status: She is alert and oriented to person,  place, and time.  Psychiatric:        Mood and Affect: Mood normal.        Behavior: Behavior normal.        Thought Content: Thought content normal.        Judgment: Judgment normal.    IMPRESSION: 1. Diffuse wall thickening of the colon consistent with pancolitis of infectious or inflammatory etiology, consider C difficile. No obstruction. No free air. 2. Mild intra hepatic and moderate extrahepatic biliary enlargement with slight enlargement of the proximal pancreatic duct. No obvious mass, however recommend correlation with LFTs and consider nonemergent MRCP evaluation 3. 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  Electronically Signed   By: Donavan Foil M.D.   On: 07/08/2017 01:42  Assessment and Plan :   1. Constipation, unspecified constipation type   2. Lower abdominal pain   3. Essential hypertension   4. Elevated blood pressure reading in office with diagnosis of hypertension     Counseled patient on need for dietary modifications.  We will have patient start docusate stool softener and use an enema once daily as needed for severe constipation.  Recommended patient follow-up with her PCP to obtain follow-up imaging including repeat MRI given all the significant findings from April 2019.  Patient is agreeable to following up with PCP, strict ER precautions given.  Recommended dietary modifications as well for her blood pressure, follow-up with PCP for this.  Maintain all regular medications.   Jaynee Eagles, PA-C 11/21/18 1058

## 2018-11-21 NOTE — Discharge Instructions (Signed)
For elevated blood pressure, make sure you are monitoring salt in your diet.  Do not eat restaurant foods and limit processed foods at home.  Processed foods include things like frozen meals preseason meats and dinners.  Make sure your pain attention to sodium labels on foods you by at the grocery store.  For seasoning you can use a brand called Mrs[employer] Dash which includes a lot of salt free seasonings.  Salads - kale, spinach, cabbage, spring mix; use seeds like pumpkin seeds or sunflower seeds, almonds; you can also use 1-2 hard boiled eggs in your salads Fruits - avocadoes, berries (blueberries, raspberries, blackberries), apples, oranges Vegetables - aspargus, cauliflower, broccoli, green beans, brussel spouts, bell peppers; stay away from starchy vegetables like potatoes, carrots, peas  Regarding meat it is better to eat lean meats and limit your red meat consumption including pork.  Wild caught fish, chicken breast are good options.  Do not eat any foods on this list that you are allergic to.

## 2018-11-21 NOTE — ED Triage Notes (Signed)
Pt sts hx of constipation and sts some abd discomfort; pt sts feels like she needs to have BM but not able to go

## 2019-05-20 ENCOUNTER — Observation Stay (HOSPITAL_COMMUNITY)
Admission: EM | Admit: 2019-05-20 | Discharge: 2019-05-22 | Disposition: A | Discharge status: Home / Self Care | Payer: Medicare HMO | Attending: Internal Medicine | Admitting: Internal Medicine

## 2019-05-20 ENCOUNTER — Emergency Department (HOSPITAL_COMMUNITY): Payer: Medicare HMO

## 2019-05-20 ENCOUNTER — Encounter (HOSPITAL_COMMUNITY): Payer: Self-pay | Admitting: Internal Medicine

## 2019-05-20 ENCOUNTER — Other Ambulatory Visit: Payer: Self-pay

## 2019-05-20 DIAGNOSIS — Z20822 Contact with and (suspected) exposure to covid-19: Secondary | ICD-10-CM | POA: Insufficient documentation

## 2019-05-20 DIAGNOSIS — I208 Other forms of angina pectoris: Secondary | ICD-10-CM

## 2019-05-20 DIAGNOSIS — E785 Hyperlipidemia, unspecified: Secondary | ICD-10-CM | POA: Diagnosis not present

## 2019-05-20 DIAGNOSIS — F0391 Unspecified dementia with behavioral disturbance: Secondary | ICD-10-CM | POA: Diagnosis not present

## 2019-05-20 DIAGNOSIS — I2510 Atherosclerotic heart disease of native coronary artery without angina pectoris: Secondary | ICD-10-CM | POA: Insufficient documentation

## 2019-05-20 DIAGNOSIS — I083 Combined rheumatic disorders of mitral, aortic and tricuspid valves: Secondary | ICD-10-CM | POA: Diagnosis not present

## 2019-05-20 DIAGNOSIS — F0390 Unspecified dementia without behavioral disturbance: Secondary | ICD-10-CM | POA: Insufficient documentation

## 2019-05-20 DIAGNOSIS — R0789 Other chest pain: Principal | ICD-10-CM | POA: Insufficient documentation

## 2019-05-20 DIAGNOSIS — N1330 Unspecified hydronephrosis: Secondary | ICD-10-CM | POA: Diagnosis not present

## 2019-05-20 DIAGNOSIS — F17220 Nicotine dependence, chewing tobacco, uncomplicated: Secondary | ICD-10-CM | POA: Diagnosis not present

## 2019-05-20 DIAGNOSIS — E871 Hypo-osmolality and hyponatremia: Secondary | ICD-10-CM | POA: Diagnosis present

## 2019-05-20 DIAGNOSIS — I10 Essential (primary) hypertension: Secondary | ICD-10-CM | POA: Diagnosis not present

## 2019-05-20 DIAGNOSIS — K4020 Bilateral inguinal hernia, without obstruction or gangrene, not specified as recurrent: Secondary | ICD-10-CM | POA: Insufficient documentation

## 2019-05-20 DIAGNOSIS — K5730 Diverticulosis of large intestine without perforation or abscess without bleeding: Secondary | ICD-10-CM | POA: Insufficient documentation

## 2019-05-20 DIAGNOSIS — J439 Emphysema, unspecified: Secondary | ICD-10-CM | POA: Diagnosis not present

## 2019-05-20 DIAGNOSIS — F03918 Unspecified dementia, unspecified severity, with other behavioral disturbance: Secondary | ICD-10-CM | POA: Diagnosis present

## 2019-05-20 DIAGNOSIS — R079 Chest pain, unspecified: Secondary | ICD-10-CM | POA: Diagnosis present

## 2019-05-20 DIAGNOSIS — Z9049 Acquired absence of other specified parts of digestive tract: Secondary | ICD-10-CM | POA: Insufficient documentation

## 2019-05-20 DIAGNOSIS — E7800 Pure hypercholesterolemia, unspecified: Secondary | ICD-10-CM | POA: Insufficient documentation

## 2019-05-20 DIAGNOSIS — Z79899 Other long term (current) drug therapy: Secondary | ICD-10-CM | POA: Insufficient documentation

## 2019-05-20 DIAGNOSIS — I700 Atherosclerosis of aorta: Secondary | ICD-10-CM | POA: Insufficient documentation

## 2019-05-20 HISTORY — DX: Unspecified dementia, unspecified severity, without behavioral disturbance, psychotic disturbance, mood disturbance, and anxiety: F03.90

## 2019-05-20 LAB — TROPONIN I (HIGH SENSITIVITY)
Troponin I (High Sensitivity): 13 ng/L (ref <18)
Troponin I (High Sensitivity): 82 ng/L — ABNORMAL HIGH (ref <18)
Troponin I (High Sensitivity): 94 ng/L — ABNORMAL HIGH (ref <18)
Troponin I (High Sensitivity): 79 ng/L — ABNORMAL HIGH (ref <18)

## 2019-05-20 LAB — CBC WITH DIFFERENTIAL/PLATELET
Abs Immature Granulocytes: 0.03 10*3/uL (ref 0.00–0.07)
Basophils Absolute: 0 10*3/uL (ref 0.0–0.1)
Basophils Relative: 0 %
Eosinophils Absolute: 0 10*3/uL (ref 0.0–0.5)
Eosinophils Relative: 0 %
HCT: 37.3 % (ref 36.0–46.0)
Hemoglobin: 12.1 g/dL (ref 12.0–15.0)
Immature Granulocytes: 0 %
Lymphocytes Relative: 11 %
Lymphs Abs: 0.8 10*3/uL (ref 0.7–4.0)
MCH: 27.8 pg (ref 26.0–34.0)
MCHC: 32.4 g/dL (ref 30.0–36.0)
MCV: 85.7 fL (ref 80.0–100.0)
Monocytes Absolute: 0.7 10*3/uL (ref 0.1–1.0)
Monocytes Relative: 10 %
Neutro Abs: 5.7 10*3/uL (ref 1.7–7.7)
Neutrophils Relative %: 79 %
Platelets: 238 10*3/uL (ref 150–400)
RBC: 4.35 MIL/uL (ref 3.87–5.11)
RDW: 13.6 % (ref 11.5–15.5)
WBC: 7.3 10*3/uL (ref 4.0–10.5)
nRBC: 0 % (ref 0.0–0.2)

## 2019-05-20 LAB — TSH: TSH: 1.77 u[IU]/mL (ref 0.350–4.500)

## 2019-05-20 LAB — I-STAT CHEM 8, ED
BUN: 4 mg/dL — ABNORMAL LOW (ref 8–23)
Calcium, Ion: 0.86 mmol/L — CL (ref 1.15–1.40)
Chloride: 91 mmol/L — ABNORMAL LOW (ref 98–111)
Creatinine, Ser: 0.5 mg/dL (ref 0.44–1.00)
Glucose, Bld: 134 mg/dL — ABNORMAL HIGH (ref 70–99)
HCT: 38 % (ref 36.0–46.0)
Hemoglobin: 12.9 g/dL (ref 12.0–15.0)
Potassium: 3.1 mmol/L — ABNORMAL LOW (ref 3.5–5.1)
Sodium: 120 mmol/L — ABNORMAL LOW (ref 135–145)
TCO2: 20 mmol/L — ABNORMAL LOW (ref 22–32)

## 2019-05-20 LAB — APTT: aPTT: 33 seconds (ref 24–36)

## 2019-05-20 LAB — PROTIME-INR
INR: 1 (ref 0.8–1.2)
Prothrombin Time: 13.5 seconds (ref 11.4–15.2)

## 2019-05-20 LAB — RESPIRATORY PANEL BY RT PCR (FLU A&B, COVID)
Influenza A by PCR: NEGATIVE
Influenza B by PCR: NEGATIVE
SARS Coronavirus 2 by RT PCR: NEGATIVE

## 2019-05-20 LAB — HEMOGLOBIN A1C
Hgb A1c MFr Bld: 5.2 % (ref 4.8–5.6)
Mean Plasma Glucose: 102.54 mg/dL

## 2019-05-20 LAB — BASIC METABOLIC PANEL
Anion gap: 11 (ref 5–15)
BUN: <5 mg/dL — ABNORMAL LOW (ref 8–23)
CO2: 21 mmol/L — ABNORMAL LOW (ref 22–32)
Calcium: 8.4 mg/dL — ABNORMAL LOW (ref 8.9–10.3)
Chloride: 90 mmol/L — ABNORMAL LOW (ref 98–111)
Creatinine, Ser: 0.87 mg/dL (ref 0.44–1.00)
GFR calc Af Amer: >60 mL/min (ref >60)
GFR calc non Af Amer: >60 mL/min (ref >60)
Glucose, Bld: 181 mg/dL — ABNORMAL HIGH (ref 70–99)
Potassium: 3.4 mmol/L — ABNORMAL LOW (ref 3.5–5.1)
Sodium: 122 mmol/L — ABNORMAL LOW (ref 135–145)

## 2019-05-20 MED ORDER — SODIUM CHLORIDE 0.9 % IV SOLN: INTRAVENOUS | Status: DC

## 2019-05-20 MED ORDER — HEPARIN (PORCINE) 25000 UT/250ML-% IV SOLN
700.0000 [IU]/h | INTRAVENOUS | Status: DC
  Administered 2019-05-20 (×2): 600 [IU]/h via INTRAVENOUS
  Filled 2019-05-20: qty 250

## 2019-05-20 MED ORDER — ONDANSETRON HCL 4 MG/2ML IJ SOLN: 4.0000 mg | Freq: Four times a day (QID) | INTRAMUSCULAR | Status: DC | PRN

## 2019-05-20 MED ORDER — SODIUM CHLORIDE 0.9% FLUSH: 3.0000 mL | Freq: Once | INTRAVENOUS | Status: DC

## 2019-05-20 MED ORDER — NITROGLYCERIN 0.4 MG SL SUBL: 0.4000 mg | SUBLINGUAL_TABLET | SUBLINGUAL | Status: DC | PRN

## 2019-05-20 MED ORDER — ASPIRIN EC 81 MG PO TBEC
81.0000 mg | DELAYED_RELEASE_TABLET | Freq: Every day | ORAL | Status: DC
  Administered 2019-05-21 – 2019-05-22 (×2): 81 mg via ORAL
  Filled 2019-05-20 (×2): qty 1

## 2019-05-20 MED ORDER — SODIUM CHLORIDE 0.9 % IV BOLUS
1000.0000 mL | Freq: Once | INTRAVENOUS | Status: AC
  Administered 2019-05-20: 14:00:00 1000 mL via INTRAVENOUS

## 2019-05-20 MED ORDER — MORPHINE SULFATE (PF) 4 MG/ML IV SOLN
4.0000 mg | Freq: Once | INTRAVENOUS | Status: DC
  Filled 2019-05-20: qty 1

## 2019-05-20 MED ORDER — ACETAMINOPHEN 325 MG PO TABS: 650.0000 mg | ORAL_TABLET | ORAL | Status: DC | PRN

## 2019-05-20 MED ORDER — HEPARIN BOLUS VIA INFUSION
3100.0000 [IU] | Freq: Once | INTRAVENOUS | Status: AC
  Administered 2019-05-20: 17:00:00 3100 [IU] via INTRAVENOUS
  Filled 2019-05-20: qty 3100

## 2019-05-20 MED ORDER — SODIUM CHLORIDE 0.9% FLUSH: 10.0000 mL | INTRAVENOUS | Status: DC | PRN

## 2019-05-20 MED ORDER — SODIUM CHLORIDE 0.9% FLUSH
3.0000 mL | Freq: Two times a day (BID) | INTRAVENOUS | Status: DC
  Administered 2019-05-20 – 2019-05-22 (×4): 3 mL via INTRAVENOUS

## 2019-05-20 MED ORDER — IOHEXOL 350 MG/ML SOLN
100.0000 mL | Freq: Once | INTRAVENOUS | Status: AC | PRN
  Administered 2019-05-20: 100 mL via INTRAVENOUS

## 2019-05-20 MED ORDER — SODIUM CHLORIDE 0.9% FLUSH: 3.0000 mL | INTRAVENOUS | Status: DC | PRN

## 2019-05-20 MED ORDER — SODIUM CHLORIDE 0.9 % IV SOLN: 250.0000 mL | INTRAVENOUS | Status: DC | PRN

## 2019-05-20 MED ORDER — METOPROLOL TARTRATE 12.5 MG HALF TABLET
12.5000 mg | ORAL_TABLET | Freq: Two times a day (BID) | ORAL | Status: DC
  Administered 2019-05-20 – 2019-05-22 (×4): 12.5 mg via ORAL
  Filled 2019-05-20 (×4): qty 1

## 2019-05-20 MED ORDER — NITROGLYCERIN IN D5W 200-5 MCG/ML-% IV SOLN: 0.0000 ug/min | INTRAVENOUS | Status: DC

## 2019-05-20 MED ORDER — POTASSIUM CHLORIDE 10 MEQ/100ML IV SOLN
10.0000 meq | INTRAVENOUS | Status: AC
  Administered 2019-05-20 (×3): 10 meq via INTRAVENOUS
  Filled 2019-05-20 (×3): qty 100

## 2019-05-20 MED ORDER — ATORVASTATIN CALCIUM 40 MG PO TABS
40.0000 mg | ORAL_TABLET | Freq: Every day | ORAL | Status: DC
  Administered 2019-05-20 – 2019-05-21 (×2): 40 mg via ORAL
  Filled 2019-05-20 (×2): qty 1

## 2019-05-20 NOTE — ED Triage Notes (Signed)
C/o sudden onset of chest pain started this morning + nausea, hx of HTN and denies any other cardiac history. EMS reported given ASA 324 mg and NTG 0.4 SL x2; patient unable to recall any relief after.

## 2019-05-20 NOTE — ED Notes (Signed)
Patient continues to bend her arm preventing the flow of her medication. This is causing a delay in medication administration. The patient and family member has been educated and the arm is repositioned for comfort.

## 2019-05-20 NOTE — H&P (Addendum)
History and Physical    Donna Butler VXB:[id number] DOB: [birth year] DOA: 05/20/2019  PCP: Jilda Panda, MD Consultants:  None Patient coming from:  Home - lives with daughter; NOK: Daughter, Laneta Guerin, [phone]  Chief Complaint: chest pain  HPI: Donna Butler is a 85 y.o. female with medical history significant of HTN and HLD presenting with chest pain.  She called her daughter this AM, complaining of chest pain.  She had L axillary/chest pain.  It started while drinking water.  It lasted "a long time".  She started feeling better in the ER.  The pain has resolved.  No prior h/o chest pain evaluation or CAD.  She felt well yesterday and slept ok.  The patient has been evaluated by her PCP for dementia but had a "negative scan" and so her daughter was told she does not have it.  On evaluation, it appears extremely likely that patient does, in fact, have dementia.  Her daughter reports that she is "mean" to her and her daughter is exhausted in trying to care for her.  However, her daughter does not desire to have her placed.   ED Course:  Chest pain r/o - looks uncomfortable, r/o for dissection.  Symptoms started this AM, no STEMI on EKG.  Troponin negative x 1.  No better with ASA and NTG.  CT resolved pain.  Has B hydronephrosis, CAD on CT.  Currently pain-free.  No prior history of CAD.  Na++ 120.  Consult to cards.  Review of Systems: As per HPI; otherwise review of systems reviewed and negative.   Ambulatory Status:  Ambulates without assistance  Past Medical History:  Diagnosis Date  . Dementia ([place])    "had a scan for [Alzheimer's] and they didn't find anything"  . High cholesterol   . Hypertension     Past Surgical History:  Procedure Laterality Date  . ABDOMINAL HYSTERECTOMY      Social History   Socioeconomic History  . Marital status: Widowed    Spouse name: Not on file  . Number of children: Not on file  . Years of education: Not on file  .  Highest education level: Not on file  Occupational History  . Occupation: retired  Tobacco Use  . Smoking status: Never Smoker  . Smokeless tobacco: Current User    Types: Chew  Substance and Sexual Activity  . Alcohol use: No  . Drug use: No  . Sexual activity: Not on file  Other Topics Concern  . Not on file  Social History Narrative  . Not on file   Social Determinants of Health   Financial Resource Strain:   . Difficulty of Paying Living Expenses: Not on file  Food Insecurity:   . Worried About Charity fundraiser in the Last Year: Not on file  . Ran Out of Food in the Last Year: Not on file  Transportation Needs:   . Lack of Transportation (Medical): Not on file  . Lack of Transportation (Non-Medical): Not on file  Physical Activity:   . Days of Exercise per Week: Not on file  . Minutes of Exercise per Session: Not on file  Stress:   . Feeling of Stress : Not on file  Social Connections:   . Frequency of Communication with Friends and Family: Not on file  . Frequency of Social Gatherings with Friends and Family: Not on file  . Attends Religious Services: Not on file  . Active Member of Clubs or Organizations: Not  on file  . Attends Archivist Meetings: Not on file  . Marital Status: Not on file  Intimate Partner Violence:   . Fear of Current or Ex-Partner: Not on file  . Emotionally Abused: Not on file  . Physically Abused: Not on file  . Sexually Abused: Not on file    Allergies  Allergen Reactions  . Penicillins Other (See Comments)    Reaction not recalled Has patient had a PCN reaction causing immediate rash, facial/tongue/throat swelling, SOB or lightheadedness with hypotension: Unk Has patient had a PCN reaction causing severe rash involving mucus membranes or skin necrosis: No Has patient had a PCN reaction that required hospitalization: No Has patient had a PCN reaction occurring within the last 10 years: No If all of the above answers are  "NO", then may proceed with Cephalosporin use.     Family History  Family history unknown: Yes    Prior to Admission medications   Medication Sig Start Date End Date Taking? Authorizing Provider  docusate sodium (COLACE) 50 MG capsule Take 1 capsule (50 mg total) by mouth 2 (two) times daily. 11/21/18   Jaynee Eagles, PA-C  lisinopril (PRINIVIL,ZESTRIL) 10 MG tablet Take 10 mg by mouth daily.    [provider]  lovastatin (MEVACOR) 40 MG tablet Take 40 mg by mouth at bedtime.    [provider]  omeprazole (PRILOSEC) 40 MG capsule Take 40 mg by mouth daily.    [provider]  potassium chloride SA (K-DUR,KLOR-CON) 20 MEQ tablet Take 1 tablet (20 mEq total) by mouth daily for 4 days. 07/10/17 07/14/17  Shelly Coss, MD  sodium phosphate (FLEET) 7-19 GM/118ML ENEM Place 133 mLs (1 enema total) rectally daily as needed for severe constipation. 11/21/18   Jaynee Eagles, PA-C    Physical Exam: Vitals:   05/20/19 1330 05/20/19 1345 05/20/19 1600 05/20/19 1615  BP: (!) 159/82 137/72    Pulse: 70 82  63  Resp: 17 (!) 23  17  Temp:      TempSrc:      SpO2: 99% 94%  96%  Weight:   51.8 kg   Height:   5' 1"  (1.549 m)      . General:  Appears calm and comfortable and is NAD; she is a very poor historian . Eyes:  PERRL, EOMI, normal lids, iris . ENT:  grossly normal hearing, lips & tongue, mmm; poor dentition . Neck:  no LAD, masses or thyromegaly; no carotid bruits . Cardiovascular:  RRR, no m/r/g. No LE edema.  Marland Kitchen Respiratory:   CTA bilaterally with no wheezes/rales/rhonchi.  Normal respiratory effort. . Abdomen:  soft, NT, ND, NABS . Back:   normal alignment, no CVAT . Skin:  no rash or induration seen on limited exam . Musculoskeletal:  grossly normal tone BUE/BLE, good ROM, no bony abnormality . Lower extremity:  No LE edema.  Limited foot exam with no ulcerations.  2+ distal pulses. Marland Kitchen Psychiatric:  grossly normal mood and affect, speech fluent and  appropriate, AOx3 . Neurologic:  CN 2-12 grossly intact, moves all extremities in coordinated fashion, sensation intact    Radiological Exams on Admission: DG Chest Portable 1 View  Result Date: 05/20/2019 CLINICAL DATA:  Chest pain EXAM: PORTABLE CHEST 1 VIEW COMPARISON:  None. FINDINGS: The heart size and mediastinal contours are within normal limits. Both lungs are clear. No pleural effusion or pneumothorax. The visualized skeletal structures are unremarkable. IMPRESSION: No acute process in the chest. Electronically Signed  By: Macy Mis M.D.   On: 05/20/2019 12:37   CT Angio Chest/Abd/Pel for Dissection W and/or Wo Contrast  Result Date: 05/20/2019 CLINICAL DATA:  Chest pain, acute aortic syndrome suspected EXAM: CT ANGIOGRAPHY CHEST, ABDOMEN AND PELVIS TECHNIQUE: Multidetector CT imaging through the chest, abdomen and pelvis was performed using the standard protocol during bolus administration of intravenous contrast. Multiplanar reconstructed images and MIPs were obtained and reviewed to evaluate the vascular anatomy. CONTRAST:  178m OMNIPAQUE IOHEXOL 350 MG/ML SOLN COMPARISON:  Same-day chest radiographs, CT abdomen pelvis, 07/08/2017 FINDINGS: CTA CHEST FINDINGS Cardiovascular: Preferential opacification of the thoracic aorta. Normal caliber of the thoracic aorta, which is mildly tortuous, with mild mixed calcific atherosclerosis. No evidence of aneurysm, dissection, or other acute aortic syndrome. Normal heart size. Scattered coronary artery calcifications including of the left main coronary artery. No pericardial effusion. Mediastinum/Nodes: No enlarged mediastinal, hilar, or axillary lymph nodes. Thyroid gland, trachea, and esophagus demonstrate no significant findings. Lungs/Pleura: Minimal centrilobular emphysema and pulmonary hyperinflation. Partially calcified, benign nodule of the medial right lung base (series 6, image 50). No pleural effusion or pneumothorax. Musculoskeletal: No  chest wall abnormality. No acute or significant osseous findings. Review of the MIP images confirms the above findings. CTA ABDOMEN AND PELVIS FINDINGS VASCULAR Normal contour and caliber of the abdominal aorta, with mild to moderate mixed calcific atherosclerosis. Standard branching pattern of the abdominal aorta with solitary bilateral renal arteries. There is mixed calcific atherosclerosis at the branch vessel origins without high-grade stenosis. Review of the MIP images confirms the above findings. NON-VASCULAR Hepatobiliary: No focal liver abnormality is seen. Status post cholecystectomy. Postoperative biliary ductal dilatation similar to prior examination. Pancreas: Unremarkable. Dilation of the central pancreatic duct up to approximately 8 mm without obstructing mass lesion identified, this appearance unchanged compared to prior examination. Spleen: Normal in size without significant abnormality. Adrenals/Urinary Tract: Adrenal glands are unremarkable. There is mild bilateral hydronephrosis and hydroureter without obstructing calculi or other lesion identified. The urinary bladder is distended. Stomach/Bowel: Stomach is within normal limits. Appendix appears normal. No evidence of bowel wall thickening, distention, or inflammatory changes. Descending and sigmoid diverticulosis. Lymphatic: No enlarged abdominal or pelvic lymph nodes. Reproductive: No mass or other significant abnormality. Other: Small bilateral inguinal hernias containing a single loop of nonobstructed small bowel on the right and a single loop of nonobstructed sigmoid colon on the left (series 6, image 150). No abdominopelvic ascites. Musculoskeletal: No acute or significant osseous findings. Review of the MIP images confirms the above findings. IMPRESSION: 1. Normal caliber of the thoracic and abdominal aorta without evidence of aneurysm, dissection, or other acute aortic pathology. Generally mild to moderate mixed atherosclerosis  throughout. Aortic Atherosclerosis (ICD10-I70.0). 2. Mild bilateral hydronephrosis and hydroureter without obstructing calculi or other lesion identified, likely due to back pressure given distended urinary bladder. Correlate for urinary retention. 3. Coronary artery disease. 4.  Emphysema (ICD10-J43.9). 5. Other chronic, incidental, and postoperative findings as detailed above. Electronically Signed   By: AEddie CandleM.D.   On: 05/20/2019 13:09    EKG: Independently reviewed.   1204 - NSR with rate 73; LVH; nonspecific ST changes with no evidence of acute ischemia 1218 -  NSR with rate 70; nonspecific ST changes with no evidence of acute ischemia 1219 -  NSR with rate 72; LVH with no evidence of acute ischemia    Labs on Admission: I have personally reviewed the available labs and imaging studies at the time of the admission.  Pertinent labs:  Na++ 122 K+ 3.4 Glucose 181 HS troponin 13 Normal CBC   Assessment/Plan Principal Problem:   Chest pain Active Problems:   Hypertension   Dyslipidemia   Dementia with behavioral disturbance (HCC)   Bilateral hydronephrosis   Tobacco dependence due to chewing tobacco   Chest pain, concern for NSTEMI -Patient with acute onset of left-sided (?poor historian, so the pain location widely varied throughout our conversation) chest pain, apparently non-exertional, resolved "after medications" through EMS and/or in the ER. -Symptoms suggestive of noncardiac vs. Atypical chest pain.  -CXR unremarkable.   -Initial cardiac HS troponin negative.  -EKG not indicative of acute ischemia.   -HEART pathway score is 5, indicating that the patient has an elevated risk score and requires further evaluation. -Will plan to place in observation status on telemetry to rule out ACS by overnight observation.  -Her initial troponin was negative but the delta troponin was elevated; this could be indicative of demand ischemia or NSTEMI - for now, will continue to  trend the troponin -Repeat EKG in AM -Start ASA 81 mg daily -Risk factor stratification with HgbA1c and FLP; will also check TSH  -Cardiology consultation - discussed with Dr. Doylene Canard -Has CAD on CTA -NPO for possible stress test vs. Cath (depending on troponin) -Will plan to start Heparin drip at Dr. Merrilee Jansky request  HTN -She is on lisinopril but has been inadvertently taking 40 mg daily for the last week instead of 20 mg daily - will resume prior dose -Will add low-dose metoprolol  HLD -Will change Mevacor to Lipitor 40 mg daily -Check AM lipids  Dementia -Daughter voiced concerns about dementia with behavioral disturbance -Based on today's exam, dementia appears extremely likely -Will request TOC assistance for possible neuropsych outpatient testing as well as for home support for her daughter (who was clearly emotionally labile and exhausted at the time of my evaluation)  Hydronephrosis -Mild hydronephrosis noted on CTA with concern for retention -Foley placed by EDP with almost 1L present in the bag already -Will leave foley for now, likely needs outpatient urology f/u  Tobacco dependence -Encourage cessation of oral tobacco    -Patch declined.  Hyponatremia -Na++ 122 today, previously normal on last check 2 years ago -Lisinopril is unlikely to cause this -Will give gentle IVF overnight and recheck in AM   Note: This patient has been tested and is pending for the novel coronavirus COVID-19.    DVT prophylaxis: Heparin Code Status:  Full - confirmed with patient/daughter Family Communication: Daughter was present throughout evaluation Disposition Plan:  She is anticipated to d/c back to home once her cardiology issues have been resolved; she may need a longer hospital stay if her troponin continues rising and/or her further ischemic testing is abnormal.  She may require Portal services at the time of d/c based on her apparent dementia. Consults called: Cardiology; [facility]  team  Admission status: It is my clinical opinion that referral for OBSERVATION is reasonable and necessary in this patient based on the above information provided. The aforementioned taken together are felt to place the patient at high risk for further clinical deterioration. However it is anticipated that the patient may be medically stable for discharge from the hospital within 24 to 48 hours.     Karmen Bongo MD [facility]   How to contact the [facility] Attending or Consulting provider [place] or covering provider during after hours [place], for this patient?  1. Check the care team in [facility] and look for  a) attending/consulting [place] provider listed and b) the [facility] team listed 2. Log into [email or web address] and use [place]'s universal password to access. If you do not have the password, please contact the hospital operator. 3. Locate the [facility] provider you are looking for under [facility] and page to a number that you can be directly reached. 4. If you still have difficulty reaching the provider, please page the [facility] (Director on Call) for the Hospitalists listed on amion for assistance.   05/20/2019, 4:26 PM

## 2019-05-20 NOTE — Consult Note (Signed)
Referring Physician: Doristine Counter Dianca Donna Butler is an 85 y.o. female.                       Chief Complaint: Chest pain  HPI: 85 years old black female with PMH of HTN, HLD, hyponatremia and Dementia had chest pain this AM. Chest pain resolved in ED. She has no prior evaluation for CAD. Her EKG is NSR, Chest x-ray is unremarkable but her HS-Troponin I levels are mildly ordered.  Past Medical History:  Diagnosis Date  . Dementia ([place])    "had a scan for [Alzheimer's] and they didn't find anything"  . High cholesterol   . Hypertension       Past Surgical History:  Procedure Laterality Date  . ABDOMINAL HYSTERECTOMY      Family History  Family history unknown: Yes   Social History:  reports that she has never smoked. Her smokeless tobacco use includes chew. She reports that she does not drink alcohol or use drugs.  Allergies:  Allergies  Allergen Reactions  . Penicillins Other (See Comments)    Reaction not recalled Has patient had a PCN reaction causing immediate rash, facial/tongue/throat swelling, SOB or lightheadedness with hypotension: Unk Has patient had a PCN reaction causing severe rash involving mucus membranes or skin necrosis: No Has patient had a PCN reaction that required hospitalization: No Has patient had a PCN reaction occurring within the last 10 years: No If all of the above answers are "NO", then may proceed with Cephalosporin use.     (Not in a hospital admission)   Results for orders placed or performed during the hospital encounter of 05/20/19 (from the past 48 hour(s))  Basic metabolic panel     Status: Abnormal   Collection Time: 05/20/19 12:07 PM  Result Value Ref Range   Sodium 122 (L) 135 - 145 mmol/L   Potassium 3.4 (L) 3.5 - 5.1 mmol/L   Chloride 90 (L) 98 - 111 mmol/L   CO2 21 (L) 22 - 32 mmol/L   Glucose, Bld 181 (H) 70 - 99 mg/dL    Comment: Glucose reference range applies only to samples taken after fasting for at least 8  hours.   BUN <5 (L) 8 - 23 mg/dL   Creatinine, Ser 0.87 0.44 - 1.00 mg/dL   Calcium 8.4 (L) 8.9 - 10.3 mg/dL   GFR calc non Af Amer >60 >60 mL/min   GFR calc Af Amer >60 >60 mL/min   Anion gap 11 5 - 15    Comment: Performed at [facility] [address] [place], [place] [address]  Troponin I (High Sensitivity)     Status: None   Collection Time: 05/20/19 12:07 PM  Result Value Ref Range   Troponin I (High Sensitivity) 13 <18 ng/L    Comment: (NOTE) Elevated high sensitivity troponin I (hsTnI) values and significant  changes across serial measurements may suggest ACS but many other  chronic and acute conditions are known to elevate hsTnI results.  Refer to the Links section for chest pain algorithms and additional  guidance. Performed at [facility], [place] [address] [place], [place] [address]   CBC with Differential     Status: None   Collection Time: 05/20/19 12:07 PM  Result Value Ref Range   WBC 7.3 4.0 - 10.5 K/uL   RBC 4.35 3.87 - 5.11 MIL/uL   Hemoglobin 12.1 12.0 - 15.0 g/dL   HCT 37.3 36.0 -  46.0 %   MCV 85.7 80.0 - 100.0 fL   MCH 27.8 26.0 - 34.0 pg   MCHC 32.4 30.0 - 36.0 g/dL   RDW 13.6 11.5 - 15.5 %   Platelets 238 150 - 400 K/uL   nRBC 0.0 0.0 - 0.2 %   Neutrophils Relative % 79 %   Neutro Abs 5.7 1.7 - 7.7 K/uL   Lymphocytes Relative 11 %   Lymphs Abs 0.8 0.7 - 4.0 K/uL   Monocytes Relative 10 %   Monocytes Absolute 0.7 0.1 - 1.0 K/uL   Eosinophils Relative 0 %   Eosinophils Absolute 0.0 0.0 - 0.5 K/uL   Basophils Relative 0 %   Basophils Absolute 0.0 0.0 - 0.1 K/uL   Immature Granulocytes 0 %   Abs Immature Granulocytes 0.03 0.00 - 0.07 K/uL    Comment: Performed at [facility] [address], [place], [place] [address]  I-stat chem 8, ED (not at [facility] or [facility])     Status: Abnormal   Collection Time: 05/20/19 12:40 PM  Result Value Ref Range   Sodium 120 (L) 135 - 145 mmol/L   Potassium 3.1 (L) 3.5 - 5.1 mmol/L   Chloride 91 (L)  98 - 111 mmol/L   BUN 4 (L) 8 - 23 mg/dL   Creatinine, Ser 0.50 0.44 - 1.00 mg/dL   Glucose, Bld 134 (H) 70 - 99 mg/dL    Comment: Glucose reference range applies only to samples taken after fasting for at least 8 hours.   Calcium, Ion 0.86 (LL) 1.15 - 1.40 mmol/L   TCO2 20 (L) 22 - 32 mmol/L   Hemoglobin 12.9 12.0 - 15.0 g/dL   HCT 38.0 36.0 - 46.0 %   Comment NOTIFIED PHYSICIAN   Respiratory Panel by RT PCR (Flu A&B, Covid) - Nasopharyngeal Swab     Status: None   Collection Time: 05/20/19  2:15 PM   Specimen: Nasopharyngeal Swab  Result Value Ref Range   SARS Coronavirus 2 by RT PCR NEGATIVE NEGATIVE    Comment: (NOTE) SARS-CoV-2 target nucleic acids are NOT DETECTED. The SARS-CoV-2 RNA is generally detectable in upper respiratoy specimens during the acute phase of infection. The lowest concentration of SARS-CoV-2 viral copies this assay can detect is 131 copies/mL. A negative result does not preclude SARS-Cov-2 infection and should not be used as the sole basis for treatment or other patient management decisions. A negative result may occur with  improper specimen collection/handling, submission of specimen other than nasopharyngeal swab, presence of viral mutation(s) within the areas targeted by this assay, and inadequate number of viral copies (<131 copies/mL). A negative result must be combined with clinical observations, patient history, and epidemiological information. The expected result is Negative. Fact Sheet for Patients:  PinkCheek.be Fact Sheet for Healthcare Providers:  GravelBags.it This test is not yet ap proved or cleared by the [place] FDA and  has been authorized for detection and/or diagnosis of SARS-CoV-2 by FDA under an Emergency Use Authorization (EUA). This EUA will remain  in effect (meaning this test can be used) for the duration of the COVID-19 declaration under Section 564(b)(1) of the  Act, 21 U.S.C. section 360bbb-3(b)(1), unless the authorization is terminated or revoked sooner.    Influenza A by PCR NEGATIVE NEGATIVE   Influenza B by PCR NEGATIVE NEGATIVE    Comment: (NOTE) The Xpert Xpress SARS-CoV-2/FLU/RSV assay is intended as an aid in  the diagnosis of influenza from Nasopharyngeal swab specimens and  should  not be used as a sole basis for treatment. Nasal washings and  aspirates are unacceptable for Xpert Xpress SARS-CoV-2/FLU/RSV  testing. Fact Sheet for Patients: PinkCheek.be Fact Sheet for Healthcare Providers: GravelBags.it This test is not yet approved or cleared by the [place] FDA and  has been authorized for detection and/or diagnosis of SARS-CoV-2 by  FDA under an Emergency Use Authorization (EUA). This EUA will remain  in effect (meaning this test can be used) for the duration of the  Covid-19 declaration under Section 564(b)(1) of the Act, 21  U.S.C. section 360bbb-3(b)(1), unless the authorization is  terminated or revoked. Performed at [facility], [place] [address] [place], [place] [address]   Troponin I (High Sensitivity)     Status: Abnormal   Collection Time: 05/20/19  2:17 PM  Result Value Ref Range   Troponin I (High Sensitivity) 82 (H) <18 ng/L    Comment: DELTA CHECK NOTED RESULT CALLED TO, READ BACK BY AND VERIFIED WITH: Vassie Loll RN [phone] BY A BENNETT (NOTE) Elevated high sensitivity troponin I (hsTnI) values and significant  changes across serial measurements may suggest ACS but many other  chronic and acute conditions are known to elevate hsTnI results.  Refer to the Links section for chest pain algorithms and additional  guidance. Performed at [facility], [place] [address] [place], [place] [address]    DG Chest Portable 1 View  Result Date: 05/20/2019 CLINICAL DATA:  Chest pain EXAM: PORTABLE CHEST 1 VIEW COMPARISON:  None. FINDINGS: The  heart size and mediastinal contours are within normal limits. Both lungs are clear. No pleural effusion or pneumothorax. The visualized skeletal structures are unremarkable. IMPRESSION: No acute process in the chest. Electronically Signed   By: Macy Mis M.D.   On: 05/20/2019 12:37   CT Angio Chest/Abd/Pel for Dissection W and/or Wo Contrast  Result Date: 05/20/2019 CLINICAL DATA:  Chest pain, acute aortic syndrome suspected EXAM: CT ANGIOGRAPHY CHEST, ABDOMEN AND PELVIS TECHNIQUE: Multidetector CT imaging through the chest, abdomen and pelvis was performed using the standard protocol during bolus administration of intravenous contrast. Multiplanar reconstructed images and MIPs were obtained and reviewed to evaluate the vascular anatomy. CONTRAST:  149m OMNIPAQUE IOHEXOL 350 MG/ML SOLN COMPARISON:  Same-day chest radiographs, CT abdomen pelvis, 07/08/2017 FINDINGS: CTA CHEST FINDINGS Cardiovascular: Preferential opacification of the thoracic aorta. Normal caliber of the thoracic aorta, which is mildly tortuous, with mild mixed calcific atherosclerosis. No evidence of aneurysm, dissection, or other acute aortic syndrome. Normal heart size. Scattered coronary artery calcifications including of the left main coronary artery. No pericardial effusion. Mediastinum/Nodes: No enlarged mediastinal, hilar, or axillary lymph nodes. Thyroid gland, trachea, and esophagus demonstrate no significant findings. Lungs/Pleura: Minimal centrilobular emphysema and pulmonary hyperinflation. Partially calcified, benign nodule of the medial right lung base (series 6, image 50). No pleural effusion or pneumothorax. Musculoskeletal: No chest wall abnormality. No acute or significant osseous findings. Review of the MIP images confirms the above findings. CTA ABDOMEN AND PELVIS FINDINGS VASCULAR Normal contour and caliber of the abdominal aorta, with mild to moderate mixed calcific atherosclerosis. Standard branching pattern of the  abdominal aorta with solitary bilateral renal arteries. There is mixed calcific atherosclerosis at the branch vessel origins without high-grade stenosis. Review of the MIP images confirms the above findings. NON-VASCULAR Hepatobiliary: No focal liver abnormality is seen. Status post cholecystectomy. Postoperative biliary ductal dilatation similar to prior examination. Pancreas: Unremarkable. Dilation of the central pancreatic duct up to approximately 8 mm without  obstructing mass lesion identified, this appearance unchanged compared to prior examination. Spleen: Normal in size without significant abnormality. Adrenals/Urinary Tract: Adrenal glands are unremarkable. There is mild bilateral hydronephrosis and hydroureter without obstructing calculi or other lesion identified. The urinary bladder is distended. Stomach/Bowel: Stomach is within normal limits. Appendix appears normal. No evidence of bowel wall thickening, distention, or inflammatory changes. Descending and sigmoid diverticulosis. Lymphatic: No enlarged abdominal or pelvic lymph nodes. Reproductive: No mass or other significant abnormality. Other: Small bilateral inguinal hernias containing a single loop of nonobstructed small bowel on the right and a single loop of nonobstructed sigmoid colon on the left (series 6, image 150). No abdominopelvic ascites. Musculoskeletal: No acute or significant osseous findings. Review of the MIP images confirms the above findings. IMPRESSION: 1. Normal caliber of the thoracic and abdominal aorta without evidence of aneurysm, dissection, or other acute aortic pathology. Generally mild to moderate mixed atherosclerosis throughout. Aortic Atherosclerosis (ICD10-I70.0). 2. Mild bilateral hydronephrosis and hydroureter without obstructing calculi or other lesion identified, likely due to back pressure given distended urinary bladder. Correlate for urinary retention. 3. Coronary artery disease. 4.  Emphysema (ICD10-J43.9). 5.  Other chronic, incidental, and postoperative findings as detailed above. Electronically Signed   By: Eddie Candle M.D.   On: 05/20/2019 13:09    Review Of Systems Constitutional: No fever, chills, weight loss or gain. Eyes: No vision change, wears glasses. No discharge or pain. Ears: No hearing loss, No tinnitus. Respiratory: No asthma, COPD, pneumonias. No shortness of breath. No hemoptysis. Cardiovascular: Positive chest pain, palpitation, leg edema. Gastrointestinal: Positive nausea, vomiting, no diarrhea, constipation. No GI bleed. No hepatitis. Genitourinary: No dysuria, hematuria, kidney stone. No incontinance. Neurological: Positive headache, no stroke, seizures.  Psychiatry: No psych facility admission for anxiety, depression, suicide. No detox. Skin: No rash. Musculoskeletal: Positve joint pain, fibromyalgia, neck pain, back pain. Lymphadenopathy: No lymphadenopathy. Hematology: No anemia or easy bruising.   Blood pressure (!) 158/75, pulse 64, temperature 98.1 F (36.7 C), temperature source Oral, resp. rate 14, height 5' 1"  (1.549 m), weight 51.8 kg, SpO2 100 %. Body mass index is 21.6 kg/m. General appearance: alert, cooperative, appears stated age and no distress Head: Normocephalic, atraumatic. Eyes: Brown eyes, pink conjunctiva, corneas clear. PERRL, EOM's intact. Neck: No adenopathy, no carotid bruit, no JVD, supple, symmetrical, trachea midline and thyroid not enlarged. Resp: Clear to auscultation bilaterally. Cardio: Regular rate and rhythm, S1, S2 normal, II/VI systolic murmur, no click, rub or gallop GI: Soft, non-tender; bowel sounds normal; no organomegaly. Extremities: No edema, cyanosis or clubbing. Skin: Warm and dry.  Neurologic: Alert and oriented X 3, normal strength. Normal coordination.  Assessment/Plan Acute coronary syndrome Hypertension, essential Hyperlipidemia Dementia Severe hyponatremia  Agree with NM myocardial perfusion stress  test. Agree with IV saline for hyponatremia. IV heparin/Atorvastatin/Metoprolol.   Time spent: Review of old records, Lab, x-rays, EKG, other cardiac tests, examination, discussion with patient over 70 minutes.  Birdie Riddle, MD  05/20/2019, 4:57 PM

## 2019-05-20 NOTE — Progress Notes (Signed)
ANTICOAGULATION CONSULT NOTE - Initial Consult  Pharmacy Consult for heparin IV Indication: chest pain/ACS  Allergies  Allergen Reactions  . Penicillins Other (See Comments)    Reaction not recalled Has patient had a PCN reaction causing immediate rash, facial/tongue/throat swelling, SOB or lightheadedness with hypotension: Unk Has patient had a PCN reaction causing severe rash involving mucus membranes or skin necrosis: No Has patient had a PCN reaction that required hospitalization: No Has patient had a PCN reaction occurring within the last 10 years: No If all of the above answers are "NO", then may proceed with Cephalosporin use.     Patient Measurements: Height: 5' 1"  (154.9 cm) Weight: 114 lb 4.8 oz (51.8 kg) IBW/kg (Calculated) : 47.8 Heparin Dosing Weight: 51.8 kg  Vital Signs: Temp: 98.1 F (36.7 C) (02/26 1200) Temp Source: Oral (02/26 1200) BP: 137/72 (02/26 1345) Pulse Rate: 82 (02/26 1345)  Labs: Recent Labs    05/20/19 1207 05/20/19 1240 05/20/19 1417  HGB 12.1 12.9  --   HCT 37.3 38.0  --   PLT 238  --   --   CREATININE 0.87 0.50  --   TROPONINIHS 13  --  82*    Estimated Creatinine Clearance: 40.9 mL/min (by C-G formula based on SCr of 0.5 mg/dL).   Medical History: Past Medical History:  Diagnosis Date  . Dementia ([place])    "had a scan for [Alzheimer's] and they didn't find anything"  . High cholesterol   . Hypertension     Medications:  (Not in a hospital admission)  Scheduled:  . [START ON 05/21/2019] aspirin EC  81 mg Oral Daily  . atorvastatin  40 mg Oral q1800  . heparin  3,100 Units Intravenous Once  . metoprolol tartrate  12.5 mg Oral BID  . sodium chloride flush  3 mL Intravenous Q12H   Infusions:  . sodium chloride    . heparin    . potassium chloride 10 mEq (05/20/19 1525)    Assessment: 85 y/o female presented to the ED with severe chest pain associated with nausea. Patient is not taking any anticoagulation PTA.   Hgb  and platelet count WNL. Troponin elevated at 82. No bleeding noted.   Goal of Therapy:  Heparin level 0.3-0.7 units/ml Monitor platelets by anticoagulation protocol: Yes   Plan:  Give 3100 units bolus x 1 Start heparin infusion at 600 units/hr Check anti-Xa level in ~8 hours and daily while on heparin Continue to monitor H&H and platelets  Monitor for s/sx of bleeding  Agnes Lawrence, PharmD PGY1 Pharmacy Resident

## 2019-05-20 NOTE — ED Provider Notes (Signed)
[facility] EMERGENCY DEPARTMENT Provider Note   CSN: [id number] Arrival date & time: 05/20/19  1150     History Chief Complaint  Patient presents with  . Chest Pain    Donna Butler is a 85 y.o. female.  Patient here with severe chest pain that onset while she was cleaning her house around 8:30 AM.  Pain has been constant.  Is associated with nausea.  There is no shortness of breath, vomiting, diaphoresis, fever or chills.  She was given aspirin and nitroglycerin by EMS without much relief.  She has never had this kind of pain in the past.  Denies any cardiac history.  History of hypertension and high cholesterol only.  The pain is constant and not improved with any treatment so far.  She is uncomfortable and writhing around the bed.  The history is provided by the patient and the EMS personnel. The history is limited by the condition of the patient.       Past Medical History:  Diagnosis Date  . High cholesterol   . Hypertension     Patient Active Problem List   Diagnosis Date Noted  . Colitis presumed infectious 07/08/2017  . Hypokalemia 07/08/2017  . Hypertension 07/08/2017  . Colitis 07/08/2017  . Dilation of biliary tract 07/08/2017  . Splenic lesion 07/08/2017    Past Surgical History:  Procedure Laterality Date  . ABDOMINAL HYSTERECTOMY       OB History   No obstetric history on file.     Family History  Family history unknown: Yes    Social History   Tobacco Use  . Smoking status: Never Smoker  . Smokeless tobacco: Never Used  Substance Use Topics  . Alcohol use: No  . Drug use: No    Home Medications Prior to Admission medications   Medication Sig Start Date End Date Taking? Authorizing Provider  docusate sodium (COLACE) 50 MG capsule Take 1 capsule (50 mg total) by mouth 2 (two) times daily. 11/21/18   Jaynee Eagles, PA-C  lisinopril (PRINIVIL,ZESTRIL) 10 MG tablet Take 10 mg by mouth daily.    [provider]  lovastatin (MEVACOR) 40 MG tablet Take 40 mg by mouth at bedtime.    [provider]  omeprazole (PRILOSEC) 40 MG capsule Take 40 mg by mouth daily.    [provider]  potassium chloride SA (K-DUR,KLOR-CON) 20 MEQ tablet Take 1 tablet (20 mEq total) by mouth daily for 4 days. 07/10/17 07/14/17  Shelly Coss, MD  sodium phosphate (FLEET) 7-19 GM/118ML ENEM Place 133 mLs (1 enema total) rectally daily as needed for severe constipation. 11/21/18   Jaynee Eagles, PA-C    Allergies    Penicillins  Review of Systems   Review of Systems  Constitutional: Negative for activity change and appetite change.  HENT: Negative for congestion and rhinorrhea.   Eyes: Negative for visual disturbance.  Respiratory: Positive for chest tightness.   Cardiovascular: Positive for chest pain. Negative for leg swelling.  Gastrointestinal: Negative for abdominal pain, nausea and vomiting.  Genitourinary: Negative for dysuria and hematuria.  Musculoskeletal: Negative for arthralgias, back pain and myalgias.  Skin: Negative for rash.  Neurological: Negative for dizziness, weakness and headaches.   all other systems are negative except as noted in the HPI and PMH.    Physical Exam Updated Vital Signs BP (!) 179/91 (BP Location: Right Arm)   Pulse 81   Temp 98.1 F (36.7 C) (Oral)   Resp 18  SpO2 100%   Physical Exam Vitals and nursing note reviewed.  Constitutional:      General: She is in acute distress.     Appearance: She is well-developed.     Comments: Uncomfortable. Writhing around bed  HENT:     Head: Normocephalic and atraumatic.     Mouth/Throat:     Pharynx: No oropharyngeal exudate.  Eyes:     Conjunctiva/sclera: Conjunctivae normal.     Pupils: Pupils are equal, round, and reactive to light.  Neck:     Comments: No meningismus. Cardiovascular:     Rate and Rhythm: Normal rate and regular rhythm.     Heart sounds: Normal heart sounds. No murmur.  Pulmonary:      Effort: Pulmonary effort is normal. No respiratory distress.     Breath sounds: Normal breath sounds.  Chest:     Chest wall: No tenderness.  Abdominal:     Palpations: Abdomen is soft.     Tenderness: There is no abdominal tenderness. There is no guarding or rebound.  Musculoskeletal:        General: No tenderness. Normal range of motion.     Cervical back: Normal range of motion and neck supple.     Comments: Equal radial pulses and grip strengths.  Skin:    General: Skin is warm.  Neurological:     Mental Status: She is alert and oriented to person, place, and time.     Cranial Nerves: No cranial nerve deficit.     Motor: No abnormal muscle tone.     Coordination: Coordination normal.     Comments:  5/5 strength throughout. CN 2-12 intact.Equal grip strength.   Psychiatric:        Behavior: Behavior normal.     ED Results / Procedures / Treatments   Labs (all labs ordered are listed, but only abnormal results are displayed) Labs Reviewed  BASIC METABOLIC PANEL - Abnormal; Notable for the following components:      Result Value   Sodium 122 (*)    Potassium 3.4 (*)    Chloride 90 (*)    CO2 21 (*)    Glucose, Bld 181 (*)    BUN <5 (*)    Calcium 8.4 (*)    All other components within normal limits  I-STAT CHEM 8, ED - Abnormal; Notable for the following components:   Sodium 120 (*)    Potassium 3.1 (*)    Chloride 91 (*)    BUN 4 (*)    Glucose, Bld 134 (*)    Calcium, Ion 0.86 (*)    TCO2 20 (*)    All other components within normal limits  TROPONIN I (HIGH SENSITIVITY) - Abnormal; Notable for the following components:   Troponin I (High Sensitivity) 82 (*)    All other components within normal limits  RESPIRATORY PANEL BY RT PCR (FLU A&B, COVID)  CBC WITH DIFFERENTIAL/PLATELET  TROPONIN I (HIGH SENSITIVITY)    EKG EKG Interpretation  Date/Time:  Friday May 20 2019 12:04:01 EST Ventricular Rate:  73 PR Interval:    QRS Duration: 126 QT  Interval:  413 QTC Calculation: 456 R Axis:   -9 Text Interpretation: Sinus rhythm Probable left ventricular hypertrophy Borderline T abnormalities, anterior leads Artifact Nonspecific ST abnormality Confirmed by Ezequiel Essex [phone]) on 05/20/2019 12:12:50 PM   Radiology DG Chest Portable 1 View  Result Date: 05/20/2019 CLINICAL DATA:  Chest pain EXAM: PORTABLE CHEST 1 VIEW COMPARISON:  None. FINDINGS: The heart size  and mediastinal contours are within normal limits. Both lungs are clear. No pleural effusion or pneumothorax. The visualized skeletal structures are unremarkable. IMPRESSION: No acute process in the chest. Electronically Signed   By: Macy Mis M.D.   On: 05/20/2019 12:37   CT Angio Chest/Abd/Pel for Dissection W and/or Wo Contrast  Result Date: 05/20/2019 CLINICAL DATA:  Chest pain, acute aortic syndrome suspected EXAM: CT ANGIOGRAPHY CHEST, ABDOMEN AND PELVIS TECHNIQUE: Multidetector CT imaging through the chest, abdomen and pelvis was performed using the standard protocol during bolus administration of intravenous contrast. Multiplanar reconstructed images and MIPs were obtained and reviewed to evaluate the vascular anatomy. CONTRAST:  176m OMNIPAQUE IOHEXOL 350 MG/ML SOLN COMPARISON:  Same-day chest radiographs, CT abdomen pelvis, 07/08/2017 FINDINGS: CTA CHEST FINDINGS Cardiovascular: Preferential opacification of the thoracic aorta. Normal caliber of the thoracic aorta, which is mildly tortuous, with mild mixed calcific atherosclerosis. No evidence of aneurysm, dissection, or other acute aortic syndrome. Normal heart size. Scattered coronary artery calcifications including of the left main coronary artery. No pericardial effusion. Mediastinum/Nodes: No enlarged mediastinal, hilar, or axillary lymph nodes. Thyroid gland, trachea, and esophagus demonstrate no significant findings. Lungs/Pleura: Minimal centrilobular emphysema and pulmonary hyperinflation. Partially calcified,  benign nodule of the medial right lung base (series 6, image 50). No pleural effusion or pneumothorax. Musculoskeletal: No chest wall abnormality. No acute or significant osseous findings. Review of the MIP images confirms the above findings. CTA ABDOMEN AND PELVIS FINDINGS VASCULAR Normal contour and caliber of the abdominal aorta, with mild to moderate mixed calcific atherosclerosis. Standard branching pattern of the abdominal aorta with solitary bilateral renal arteries. There is mixed calcific atherosclerosis at the branch vessel origins without high-grade stenosis. Review of the MIP images confirms the above findings. NON-VASCULAR Hepatobiliary: No focal liver abnormality is seen. Status post cholecystectomy. Postoperative biliary ductal dilatation similar to prior examination. Pancreas: Unremarkable. Dilation of the central pancreatic duct up to approximately 8 mm without obstructing mass lesion identified, this appearance unchanged compared to prior examination. Spleen: Normal in size without significant abnormality. Adrenals/Urinary Tract: Adrenal glands are unremarkable. There is mild bilateral hydronephrosis and hydroureter without obstructing calculi or other lesion identified. The urinary bladder is distended. Stomach/Bowel: Stomach is within normal limits. Appendix appears normal. No evidence of bowel wall thickening, distention, or inflammatory changes. Descending and sigmoid diverticulosis. Lymphatic: No enlarged abdominal or pelvic lymph nodes. Reproductive: No mass or other significant abnormality. Other: Small bilateral inguinal hernias containing a single loop of nonobstructed small bowel on the right and a single loop of nonobstructed sigmoid colon on the left (series 6, image 150). No abdominopelvic ascites. Musculoskeletal: No acute or significant osseous findings. Review of the MIP images confirms the above findings. IMPRESSION: 1. Normal caliber of the thoracic and abdominal aorta without  evidence of aneurysm, dissection, or other acute aortic pathology. Generally mild to moderate mixed atherosclerosis throughout. Aortic Atherosclerosis (ICD10-I70.0). 2. Mild bilateral hydronephrosis and hydroureter without obstructing calculi or other lesion identified, likely due to back pressure given distended urinary bladder. Correlate for urinary retention. 3. Coronary artery disease. 4.  Emphysema (ICD10-J43.9). 5. Other chronic, incidental, and postoperative findings as detailed above. Electronically Signed   By: AEddie CandleM.D.   On: 05/20/2019 13:09    Procedures .Critical Care Performed by: REzequiel Essex MD Authorized by: REzequiel Essex MD   Critical care provider statement:    Critical care time (minutes):  35   Critical care was necessary to treat or prevent imminent or life-threatening deterioration of the  following conditions:  Endocrine crisis (NSTEMI)   Critical care was time spent personally by me on the following activities:  Discussions with consultants, evaluation of patient's response to treatment, examination of patient, ordering and performing treatments and interventions, ordering and review of laboratory studies, ordering and review of radiographic studies, pulse oximetry, re-evaluation of patient's condition, obtaining history from patient or surrogate and review of old charts   (including critical care time)  Medications Ordered in ED Medications  sodium chloride flush (NS) 0.9 % injection 3 mL (has no administration in time range)  morphine 4 MG/ML injection 4 mg (has no administration in time range)    ED Course  I have reviewed the triage vital signs and the nursing notes.  Pertinent labs & imaging results that were available during my care of the patient were reviewed by me and considered in my medical decision making (see chart for details).    MDM Rules/Calculators/A&P                     Sudden onset chest pain without radiation.  EKG without  acute ischemia but artifact is present.  Patient given aspirin nitroglycerin without much effect.  Given her degree of discomfort and hypertension, aortic dissection is considered.  The mediastinum does appear to be light on chest x-ray as well.  CT dissection study was negative for dissection or other acute pathology.  Does show bilateral hydronephrosis likely secondary to urinary retention. Foley catheter placed.  Patient's chest pain has resolved upon returning to the room.  Her troponin is negative.  IV nitroglycerin canceled.  Patient found to have hyponatremia on her labs and is gently hydrated.  She is not on a diuretic.  Uncertain etiology.  Patient will need admission for her chest pain as well as hyponatremia.  Dr. Lorin Mercy will admit patient. Dr. Doylene Canard to consult from cardiology.  Final Clinical Impression(s) / ED Diagnoses Final diagnoses:  Atypical chest pain  Hyponatremia    Rx / DC Orders ED Discharge Orders    None       Lillionna Nabi, Annie Main, MD 05/20/19 1554

## 2019-05-21 ENCOUNTER — Observation Stay (HOSPITAL_COMMUNITY): Payer: Medicare HMO

## 2019-05-21 DIAGNOSIS — Z20822 Contact with and (suspected) exposure to covid-19: Secondary | ICD-10-CM | POA: Diagnosis not present

## 2019-05-21 DIAGNOSIS — R0789 Other chest pain: Secondary | ICD-10-CM | POA: Diagnosis not present

## 2019-05-21 DIAGNOSIS — E871 Hypo-osmolality and hyponatremia: Secondary | ICD-10-CM | POA: Diagnosis not present

## 2019-05-21 DIAGNOSIS — I10 Essential (primary) hypertension: Secondary | ICD-10-CM | POA: Diagnosis not present

## 2019-05-21 DIAGNOSIS — N1330 Unspecified hydronephrosis: Secondary | ICD-10-CM | POA: Diagnosis not present

## 2019-05-21 DIAGNOSIS — F0391 Unspecified dementia with behavioral disturbance: Secondary | ICD-10-CM | POA: Diagnosis not present

## 2019-05-21 DIAGNOSIS — R338 Other retention of urine: Secondary | ICD-10-CM

## 2019-05-21 LAB — CBC
HCT: 36.3 % (ref 36.0–46.0)
Hemoglobin: 11.7 g/dL — ABNORMAL LOW (ref 12.0–15.0)
MCH: 27.5 pg (ref 26.0–34.0)
MCHC: 32.2 g/dL (ref 30.0–36.0)
MCV: 85.4 fL (ref 80.0–100.0)
Platelets: 206 10*3/uL (ref 150–400)
RBC: 4.25 MIL/uL (ref 3.87–5.11)
RDW: 14.1 % (ref 11.5–15.5)
WBC: 4.5 10*3/uL (ref 4.0–10.5)
nRBC: 0 % (ref 0.0–0.2)

## 2019-05-21 LAB — BASIC METABOLIC PANEL
Anion gap: 8 (ref 5–15)
BUN: <5 mg/dL — ABNORMAL LOW (ref 8–23)
CO2: 26 mmol/L (ref 22–32)
Calcium: 9.1 mg/dL (ref 8.9–10.3)
Chloride: 106 mmol/L (ref 98–111)
Creatinine, Ser: 0.64 mg/dL (ref 0.44–1.00)
GFR calc Af Amer: >60 mL/min (ref >60)
GFR calc non Af Amer: >60 mL/min (ref >60)
Glucose, Bld: 78 mg/dL (ref 70–99)
Potassium: 3.5 mmol/L (ref 3.5–5.1)
Sodium: 140 mmol/L (ref 135–145)

## 2019-05-21 LAB — LIPID PANEL
Cholesterol: 158 mg/dL (ref 0–200)
HDL: 85 mg/dL (ref >40)
LDL Cholesterol: 67 mg/dL (ref 0–99)
Total CHOL/HDL Ratio: 1.9 RATIO
Triglycerides: 29 mg/dL (ref <150)
VLDL: 6 mg/dL (ref 0–40)

## 2019-05-21 LAB — ECHOCARDIOGRAM COMPLETE
Height: 61 in
Weight: 1656 oz

## 2019-05-21 LAB — HEPARIN LEVEL (UNFRACTIONATED)
Heparin Unfractionated: 0.18 IU/mL — ABNORMAL LOW (ref 0.30–0.70)
Heparin Unfractionated: 0.4 IU/mL (ref 0.30–0.70)

## 2019-05-21 MED ORDER — CHLORHEXIDINE GLUCONATE CLOTH 2 % EX PADS
6.0000 | MEDICATED_PAD | Freq: Every day | CUTANEOUS | Status: DC
  Administered 2019-05-21 – 2019-05-22 (×2): 6 via TOPICAL

## 2019-05-21 MED ORDER — HEPARIN BOLUS VIA INFUSION
1000.0000 [IU] | Freq: Once | INTRAVENOUS | Status: AC
  Administered 2019-05-21: 10:00:00 1000 [IU] via INTRAVENOUS
  Filled 2019-05-21: qty 1000

## 2019-05-21 MED ORDER — POTASSIUM CHLORIDE CRYS ER 20 MEQ PO TBCR
40.0000 meq | EXTENDED_RELEASE_TABLET | Freq: Once | ORAL | Status: AC
  Administered 2019-05-21: 40 meq via ORAL
  Filled 2019-05-21: qty 2

## 2019-05-21 MED ORDER — PANTOPRAZOLE SODIUM 40 MG PO TBEC
40.0000 mg | DELAYED_RELEASE_TABLET | Freq: Every day | ORAL | Status: DC
  Administered 2019-05-21 – 2019-05-22 (×2): 40 mg via ORAL
  Filled 2019-05-21 (×2): qty 2

## 2019-05-21 MED ORDER — CALCIUM GLUCONATE-NACL 1-0.675 GM/50ML-% IV SOLN
1.0000 g | Freq: Once | INTRAVENOUS | Status: AC
  Administered 2019-05-21: 12:00:00 1000 mg via INTRAVENOUS
  Filled 2019-05-21: qty 50

## 2019-05-21 MED ORDER — HALOPERIDOL LACTATE 5 MG/ML IJ SOLN
2.0000 mg | Freq: Once | INTRAMUSCULAR | Status: AC
  Administered 2019-05-21: 2 mg via INTRAVENOUS
  Filled 2019-05-21: qty 1

## 2019-05-21 NOTE — Progress Notes (Signed)
[facility] for heparin IV Indication: chest pain/ACS  Allergies  Allergen Reactions  . Penicillins Other (See Comments)    Reaction not recalled Has patient had a PCN reaction causing immediate rash, facial/tongue/throat swelling, SOB or lightheadedness with hypotension: Unk Has patient had a PCN reaction causing severe rash involving mucus membranes or skin necrosis: No Has patient had a PCN reaction that required hospitalization: No Has patient had a PCN reaction occurring within the last 10 years: No If all of the above answers are "NO", then may proceed with Cephalosporin use.     Patient Measurements: Height: 5' 1"  (154.9 cm) Weight: 103 lb 8 oz (46.9 kg) IBW/kg (Calculated) : 47.8 Heparin Dosing Weight: 51.8 kg  Vital Signs: Temp: 98.4 F (36.9 C) (02/27 0752) Temp Source: Oral (02/27 0752) BP: 155/85 (02/27 0752) Pulse Rate: 71 (02/27 0752)  Labs: Recent Labs    05/20/19 1207 05/20/19 1207 05/20/19 1240 05/20/19 1417 05/20/19 1717 05/20/19 2049 05/21/19 0558 05/21/19 1542  HGB 12.1   < > 12.9  --   --   --  11.7*  --   HCT 37.3  --  38.0  --   --   --  36.3  --   PLT 238  --   --   --   --   --  206  --   APTT  --   --   --   --  33  --   --   --   LABPROT  --   --   --   --  13.5  --   --   --   INR  --   --   --   --  1.0  --   --   --   HEPARINUNFRC  --   --   --   --   --   --  0.18* 0.40  CREATININE 0.87  --  0.50  --   --   --  0.64  --   TROPONINIHS 13   < >  --  82* 94* 79*  --   --    < > = values in this interval not displayed.    Estimated Creatinine Clearance: 40.1 mL/min (by C-G formula based on SCr of 0.64 mg/dL).   Medical History: Past Medical History:  Diagnosis Date  . Dementia ([place])    "had a scan for [Alzheimer's] and they didn't find anything"  . High cholesterol   . Hypertension     Medications:  Medications Prior to Admission  Medication Sig Dispense Refill Last Dose  . acetaminophen  (TYLENOL) 500 MG tablet Take 500-1,000 mg by mouth every 6 (six) hours as needed for mild pain or headache.   unk at unk  . lisinopril (ZESTRIL) 20 MG tablet Take 20 mg by mouth daily.   05/19/2019 at SEE NOTE  . lovastatin (MEVACOR) 40 MG tablet Take 80 mg by mouth at bedtime.    05/19/2019 at pm   Scheduled:  . aspirin EC  81 mg Oral Daily  . atorvastatin  40 mg Oral q1800  . Chlorhexidine Gluconate Cloth  6 each Topical Daily  . haloperidol lactate  2 mg Intravenous Once  . metoprolol tartrate  12.5 mg Oral BID  . pantoprazole  40 mg Oral Daily  . sodium chloride flush  3 mL Intravenous Q12H   Infusions:  . sodium chloride    . heparin 700 Units/hr (05/21/19 0929)  Assessment: 85 y/o female presented to the ED with severe chest pain associated with nausea. Patient is not taking any anticoagulation PTA. Pharmacy has been consulted to dose heparin.  Heparin level came back therapeutic at 0.4, on 700 units/hr. CBC stable. No s/sx of bleeding or infusion issues per nursing.   Goal of Therapy:  Heparin level 0.3-0.7 units/ml Monitor platelets by anticoagulation protocol: Yes   Plan:  Continue heparin infusion to 700 units/hr Check anti-Xa level with AM labs and daily while on heparin Continue to monitor H&H and platelets  Monitor for s/sx of bleeding  Antonietta Jewel, PharmD, BCCCP Clinical Pharmacist  Phone: [phone]  Please check AMION for all [facility] phone numbers After 10:00 PM, call [facility] [phone]

## 2019-05-21 NOTE — Progress Notes (Signed)
ANTICOAGULATION CONSULT NOTE - Initial Consult  Pharmacy Consult for heparin IV Indication: chest pain/ACS  Allergies  Allergen Reactions  . Penicillins Other (See Comments)    Reaction not recalled Has patient had a PCN reaction causing immediate rash, facial/tongue/throat swelling, SOB or lightheadedness with hypotension: Unk Has patient had a PCN reaction causing severe rash involving mucus membranes or skin necrosis: No Has patient had a PCN reaction that required hospitalization: No Has patient had a PCN reaction occurring within the last 10 years: No If all of the above answers are "NO", then may proceed with Cephalosporin use.     Patient Measurements: Height: 5' 1"  (154.9 cm) Weight: 103 lb 8 oz (46.9 kg) IBW/kg (Calculated) : 47.8 Heparin Dosing Weight: 51.8 kg  Vital Signs: Temp: 98.3 F (36.8 C) (02/27 0428) Temp Source: Oral (02/27 0428) BP: 145/71 (02/27 0428) Pulse Rate: 59 (02/27 0428)  Labs: Recent Labs    05/20/19 1207 05/20/19 1207 05/20/19 1240 05/20/19 1417 05/20/19 1717 05/20/19 2049 05/21/19 0558  HGB 12.1   < > 12.9  --   --   --  11.7*  HCT 37.3  --  38.0  --   --   --  36.3  PLT 238  --   --   --   --   --  206  APTT  --   --   --   --  33  --   --   LABPROT  --   --   --   --  13.5  --   --   INR  --   --   --   --  1.0  --   --   HEPARINUNFRC  --   --   --   --   --   --  0.18*  CREATININE 0.87  --  0.50  --   --   --  0.64  TROPONINIHS 13   < >  --  82* 94* 79*  --    < > = values in this interval not displayed.    Estimated Creatinine Clearance: 40.1 mL/min (by C-G formula based on SCr of 0.64 mg/dL).   Medical History: Past Medical History:  Diagnosis Date  . Dementia ([place])    "had a scan for [Alzheimer's] and they didn't find anything"  . High cholesterol   . Hypertension     Medications:  Medications Prior to Admission  Medication Sig Dispense Refill Last Dose  . acetaminophen (TYLENOL) 500 MG tablet Take 500-1,000 mg  by mouth every 6 (six) hours as needed for mild pain or headache.   unk at unk  . lisinopril (ZESTRIL) 20 MG tablet Take 20 mg by mouth daily.   05/19/2019 at SEE NOTE  . lovastatin (MEVACOR) 40 MG tablet Take 80 mg by mouth at bedtime.    05/19/2019 at pm   Scheduled:  . aspirin EC  81 mg Oral Daily  . atorvastatin  40 mg Oral q1800  . Chlorhexidine Gluconate Cloth  6 each Topical Daily  . heparin  1,000 Units Intravenous Once  . metoprolol tartrate  12.5 mg Oral BID  . sodium chloride flush  3 mL Intravenous Q12H   Infusions:  . sodium chloride    . heparin 600 Units/hr (05/20/19 2209)    Assessment: 85 y/o female presented to the ED with severe chest pain associated with nausea. Patient is not taking any anticoagulation PTA. Pharmacy has been consulted to dose heparin.  Hgb  slightly decreased from 12.9 to 11.7 and platelet count WNL. Troponin decreasing from 82 to 79. No bleeding noted per RN. Heparin was restarted at ~2200 after IV access was regained. No issues with infusion after restarting heparin per RN.  Goal of Therapy:  Heparin level 0.3-0.7 units/ml Monitor platelets by anticoagulation protocol: Yes   Plan:  Give 1000 units bolus x 1 Increase heparin infusion to 700 units/hr Check anti-Xa level in ~8 hours and daily while on heparin Continue to monitor H&H and platelets  Monitor for s/sx of bleeding  Sherren Kerns, PharmD PGY1 Acute Care Pharmacy Resident

## 2019-05-21 NOTE — Progress Notes (Addendum)
Progress Note    Donna Butler  BDZ:[id number] DOB: Jan 22, 1935  DOA: 05/20/2019 PCP: Jilda Panda, MD    Brief Narrative:    Medical records reviewed and are as summarized below:  Donna Butler is an 85 y.o. female with medical history significant of HTN and HLD presenting with chest pain.  She called her daughter this AM, complaining of chest pain.  She had L axillary/chest pain.  It started while drinking water.  It lasted "a long time".  She started feeling better in the ER.  The pain has resolved.  Assessment/Plan:   Principal Problem:   Chest pain Active Problems:   Hypertension   Dyslipidemia   Dementia with behavioral disturbance (HCC)   Bilateral hydronephrosis   Tobacco dependence due to chewing tobacco   Chest pain -Started after drinking water-question esophageal spasm -CXR unremarkable.  -Troponin slightly elevated -EKG not indicative of acute ischemia.  -HEART pathway score is 5, indicating that the patient has an elevated risk score and requires further evaluation. -Start ASA 81 mg daily -Cardiology consult with Dr. Doylene Canard appreciated: He mentions something about a stress test in his note but nothing was ordered -Echo: Patient has normal wall motion abnormalities and an EF of 60 to 65% -Suspect based on patient's mental status she will be more of a candidate for medical management versus stress testing and cath -Heparin drip was started in the ER: We will defer to cardiology-- with patient's dementia and fall risk would think she would not be appropriate for blood thinner  HTN -She is on lisinopril but has been inadvertently taking 40 mg daily for the last week instead of 20 mg daily - will resume prior dose -Will add low-dose metoprolol  HLD -LDL 62  Dementia -Daughter voiced concerns about dementia with behavioral disturbance - dementia appears extremely likely based on exam -Will request TOC assistance for possible neuropsych  outpatient testing as well as for home support for her daughter (per Dr. Lorin Mercy daughter was clearly emotionally labile and exhausted at the time of my evaluation) -We will get UA to rule out infection  Hydronephrosis -Mild hydronephrosis noted on CTA with concern for retention -Foley placed by EDP with almost 1L immediately out and a total of 3 L yesterday -Will leave foley for now, likely needs outpatient urology f/u for voiding trial  Tobacco dependence -Encourage cessation of oral tobacco    -Patch declined.  Hyponatremia -Na++ 122 today, previously normal on last check 2 years ago -Question related to urinary retention -Resolved with IV fluids and Foley  Low calcium Replete  Family Communication/Anticipated D/C date and plan/Code Status   DVT prophylaxis: Heparin drip Code Status: Full Code.  Family Communication:  Disposition Plan: Pending recommendations by cardiology   Medical Consultants:   Cardiology    Subjective:   Patient appears anxious and difficult to redirect Denies chest pain  Objective:    Vitals:   05/20/19 2022 05/21/19 0200 05/21/19 0428 05/21/19 0752  BP: (!) 169/88 135/78 (!) 145/71 (!) 155/85  Pulse: 73  (!) 59 71  Resp: 18  18 18   Temp: 98.9 F (37.2 C)  98.3 F (36.8 C) 98.4 F (36.9 C)  TempSrc: Oral  Oral Oral  SpO2: 100%  100% 100%  Weight:   46.9 kg   Height:        Intake/Output Summary (Last 24 hours) at 05/21/2019 1207 Last data filed at 05/21/2019 0940 Gross per 24 hour  Intake --  Output 4400  ml  Net -4400 ml   Filed Weights   05/20/19 1600 05/20/19 2021 05/21/19 0428  Weight: 51.8 kg 48.4 kg 46.9 kg    Exam: In bed, confused to situation: Tell the nurse she does not want to be alone Regular rate and rhythm on telemetry No lower extremity edema Alert but difficult to keep on task  Data Reviewed:   I have personally reviewed following labs and imaging studies:  Labs: Labs show the following:   Basic  Metabolic Panel: Recent Labs  Lab 05/20/19 1207 05/20/19 1207 05/20/19 1240 05/21/19 0558  NA 122*  --  120* 140  K 3.4*   < > 3.1* 3.5  CL 90*  --  91* 106  CO2 21*  --   --  26  GLUCOSE 181*  --  134* 78  BUN <5*  --  4* <5*  CREATININE 0.87  --  0.50 0.64  CALCIUM 8.4*  --   --  9.1   < > = values in this interval not displayed.   GFR Estimated Creatinine Clearance: 40.1 mL/min (by C-G formula based on SCr of 0.64 mg/dL). Liver Function Tests: No results for input(s): AST, ALT, ALKPHOS, BILITOT, PROT, ALBUMIN in the last 168 hours. No results for input(s): LIPASE, AMYLASE in the last 168 hours. No results for input(s): AMMONIA in the last 168 hours. Coagulation profile Recent Labs  Lab 05/20/19 1717  INR 1.0    CBC: Recent Labs  Lab 05/20/19 1207 05/20/19 1240 05/21/19 0558  WBC 7.3  --  4.5  NEUTROABS 5.7  --   --   HGB 12.1 12.9 11.7*  HCT 37.3 38.0 36.3  MCV 85.7  --  85.4  PLT 238  --  206   Cardiac Enzymes: No results for input(s): CKTOTAL, CKMB, CKMBINDEX, TROPONINI in the last 168 hours. BNP (last 3 results) No results for input(s): PROBNP in the last 8760 hours. CBG: No results for input(s): GLUCAP in the last 168 hours. D-Dimer: No results for input(s): DDIMER in the last 72 hours. Hgb A1c: Recent Labs    05/20/19 1716  HGBA1C 5.2   Lipid Profile: Recent Labs    05/21/19 0558  CHOL 158  HDL 85  LDLCALC 67  TRIG 29  CHOLHDL 1.9   Thyroid function studies: Recent Labs    05/20/19 1716  TSH 1.770   Anemia work up: No results for input(s): VITAMINB12, FOLATE, FERRITIN, TIBC, IRON, RETICCTPCT in the last 72 hours. Sepsis Labs: Recent Labs  Lab 05/20/19 1207 05/21/19 0558  WBC 7.3 4.5    Microbiology Recent Results (from the past 240 hour(s))  Respiratory Panel by RT PCR (Flu A&B, Covid) - Nasopharyngeal Swab     Status: None   Collection Time: 05/20/19  2:15 PM   Specimen: Nasopharyngeal Swab  Result Value Ref Range Status    SARS Coronavirus 2 by RT PCR NEGATIVE NEGATIVE Final    Comment: (NOTE) SARS-CoV-2 target nucleic acids are NOT DETECTED. The SARS-CoV-2 RNA is generally detectable in upper respiratoy specimens during the acute phase of infection. The lowest concentration of SARS-CoV-2 viral copies this assay can detect is 131 copies/mL. A negative result does not preclude SARS-Cov-2 infection and should not be used as the sole basis for treatment or other patient management decisions. A negative result may occur with  improper specimen collection/handling, submission of specimen other than nasopharyngeal swab, presence of viral mutation(s) within the areas targeted by this assay, and inadequate number of viral copies (<  131 copies/mL). A negative result must be combined with clinical observations, patient history, and epidemiological information. The expected result is Negative. Fact Sheet for Patients:  PinkCheek.be Fact Sheet for Healthcare Providers:  GravelBags.it This test is not yet ap proved or cleared by the [place] FDA and  has been authorized for detection and/or diagnosis of SARS-CoV-2 by FDA under an Emergency Use Authorization (EUA). This EUA will remain  in effect (meaning this test can be used) for the duration of the COVID-19 declaration under Section 564(b)(1) of the Act, 21 U.S.C. section 360bbb-3(b)(1), unless the authorization is terminated or revoked sooner.    Influenza A by PCR NEGATIVE NEGATIVE Final   Influenza B by PCR NEGATIVE NEGATIVE Final    Comment: (NOTE) The Xpert Xpress SARS-CoV-2/FLU/RSV assay is intended as an aid in  the diagnosis of influenza from Nasopharyngeal swab specimens and  should not be used as a sole basis for treatment. Nasal washings and  aspirates are unacceptable for Xpert Xpress SARS-CoV-2/FLU/RSV  testing. Fact Sheet for Patients: PinkCheek.be Fact  Sheet for Healthcare Providers: GravelBags.it This test is not yet approved or cleared by the [place] FDA and  has been authorized for detection and/or diagnosis of SARS-CoV-2 by  FDA under an Emergency Use Authorization (EUA). This EUA will remain  in effect (meaning this test can be used) for the duration of the  Covid-19 declaration under Section 564(b)(1) of the Act, 21  U.S.C. section 360bbb-3(b)(1), unless the authorization is  terminated or revoked. Performed at [facility], [place] [address], [place], [place] [address]     Procedures and diagnostic studies:  DG Chest Portable 1 View  Result Date: 05/20/2019 CLINICAL DATA:  Chest pain EXAM: PORTABLE CHEST 1 VIEW COMPARISON:  None. FINDINGS: The heart size and mediastinal contours are within normal limits. Both lungs are clear. No pleural effusion or pneumothorax. The visualized skeletal structures are unremarkable. IMPRESSION: No acute process in the chest. Electronically Signed   By: Macy Mis M.D.   On: 05/20/2019 12:37   ECHOCARDIOGRAM COMPLETE  Result Date: 05/21/2019    ECHOCARDIOGRAM REPORT   Patient Name:   Donna Butler Date of Exam: 05/21/2019 Medical Rec #:  [id number]            Height:       61.0 in Accession #:    [id number]           Weight:       103.5 lb Date of Birth:  05-27-1934            BSA:          1.428 m Patient Age:    54 years             BP:           145/71 mmHg Patient Gender: F                    HR:           66 bpm. Exam Location:  Inpatient Procedure: 2D Echo, Cardiac Doppler and Color Doppler Indications:     Chest Pain 786.50  History:         Patient has no prior history of Echocardiogram examinations.                  Signs/Symptoms:Chest Pain; Risk Factors:Hypertension,                  Dyslipidemia and Non-Smoker.  Sonographer:     Vickie Epley RDCS Referring Phys:  [place] Diagnosing Phys: Dixie Dials MD  Sonographer Comments: Suboptimal  parasternal window. IMPRESSIONS  1. Left ventricular ejection fraction, by estimation, is 60 to 65%. The left ventricle has normal function. The left ventricle has no regional wall motion abnormalities. Left ventricular diastolic parameters are consistent with Grade I diastolic dysfunction (impaired relaxation).  2. Right ventricular systolic function is normal. The right ventricular size is normal. There is mildly elevated pulmonary artery systolic pressure.  3. Left atrial size was mildly dilated.  4. Right atrial size was mildly dilated.  5. The mitral valve is degenerative. Mild to moderate mitral valve regurgitation. No evidence of mitral stenosis.  6. The aortic valve is tricuspid. Aortic valve regurgitation is mild. Mild aortic valve stenosis.  7. Aortic Calcific Aortic root and ascending aorta. There is Severe (Grade IV) atheroma plaque involving the aortic root and ascending aorta.  8. The inferior vena cava is normal in size with greater than 50% respiratory variability, suggesting right atrial pressure of 3 mmHg. FINDINGS  Left Ventricle: Left ventricular ejection fraction, by estimation, is 60 to 65%. The left ventricle has normal function. The left ventricle has no regional wall motion abnormalities. The left ventricular internal cavity size was normal in size. There is  no left ventricular hypertrophy. Left ventricular diastolic parameters are consistent with Grade I diastolic dysfunction (impaired relaxation). Right Ventricle: The right ventricular size is normal. No increase in right ventricular wall thickness. Right ventricular systolic function is normal. There is mildly elevated pulmonary artery systolic pressure. The tricuspid regurgitant velocity is 2.65  m/s, and with an assumed right atrial pressure of 3 mmHg, the estimated right ventricular systolic pressure is 76.7 mmHg. Left Atrium: Left atrial size was mildly dilated. Right Atrium: Right atrial size was mildly dilated. Pericardium: There  is no evidence of pericardial effusion. Mitral Valve: The mitral valve is degenerative in appearance. There is mild thickening of the mitral valve leaflet(s). There is moderate calcification of the mitral valve leaflet(s). Normal mobility of the mitral valve leaflets. Moderate to severe mitral  annular calcification. Mild to moderate mitral valve regurgitation. No evidence of mitral valve stenosis. Tricuspid Valve: The tricuspid valve is grossly normal. Tricuspid valve regurgitation is mild. Aortic Valve: The aortic valve is tricuspid. . There is mild thickening and moderate calcification of the aortic valve. Aortic valve regurgitation is mild. Mild aortic stenosis is present. Moderate aortic valve annular calcification. There is mild thickening of the aortic valve. There is moderate calcification of the aortic valve. Pulmonic Valve: The pulmonic valve was grossly normal. Pulmonic valve regurgitation is trivial. Aorta: Calcific Aortic root and ascending aorta. There is severe (Grade IV) atheroma plaque involving the aortic root and ascending aorta. Venous: The inferior vena cava is normal in size with greater than 50% respiratory variability, suggesting right atrial pressure of 3 mmHg. IAS/Shunts: The interatrial septum was not assessed.  LEFT VENTRICLE PLAX 2D LVIDd:         3.80 cm     Diastology LVIDs:         3.00 cm     LV e' lateral:   9.29 cm/s LV PW:         0.90 cm     LV E/e' lateral: 7.4 LV IVS:        0.90 cm     LV e' medial:    5.17 cm/s LVOT diam:     1.90 cm  LV E/e' medial:  13.3 LV SV:         59 LV SV Index:   41 LVOT Area:     2.84 cm  LV Volumes (MOD) LV vol d, MOD A2C: 73.2 ml LV vol d, MOD A4C: 67.3 ml LV vol s, MOD A2C: 27.4 ml LV vol s, MOD A4C: 23.4 ml LV SV MOD A2C:     45.8 ml LV SV MOD A4C:     67.3 ml LV SV MOD BP:      46.9 ml RIGHT VENTRICLE RV S prime:     8.49 cm/s TAPSE (M-mode): 1.8 cm LEFT ATRIUM             Index       RIGHT ATRIUM           Index LA diam:        2.90 cm  2.03 cm/m  RA Area:     10.60 cm LA Vol (A2C):   30.1 ml 21.09 ml/m RA Volume:   22.50 ml  15.76 ml/m LA Vol (A4C):   15.8 ml 11.07 ml/m LA Biplane Vol: 22.6 ml 15.83 ml/m  AORTIC VALVE LVOT Vmax:   84.20 cm/s LVOT Vmean:  59.900 cm/s LVOT VTI:    0.208 m  AORTA Ao Root diam: 3.00 cm MITRAL VALVE               TRICUSPID VALVE MV Area (PHT): 2.39 cm    TR Peak grad:   28.1 mmHg MV Decel Time: 317 msec    TR Vmax:        265.00 cm/s MV E velocity: 68.70 cm/s MV A velocity: 88.60 cm/s  SHUNTS MV E/A ratio:  0.78        Systemic VTI:  0.21 m                            Systemic Diam: 1.90 cm Dixie Dials MD Electronically signed by Dixie Dials MD Signature Date/Time: 05/21/2019/11:56:00 AM    Final    CT Angio Chest/Abd/Pel for Dissection W and/or Wo Contrast  Result Date: 05/20/2019 CLINICAL DATA:  Chest pain, acute aortic syndrome suspected EXAM: CT ANGIOGRAPHY CHEST, ABDOMEN AND PELVIS TECHNIQUE: Multidetector CT imaging through the chest, abdomen and pelvis was performed using the standard protocol during bolus administration of intravenous contrast. Multiplanar reconstructed images and MIPs were obtained and reviewed to evaluate the vascular anatomy. CONTRAST:  163m OMNIPAQUE IOHEXOL 350 MG/ML SOLN COMPARISON:  Same-day chest radiographs, CT abdomen pelvis, 07/08/2017 FINDINGS: CTA CHEST FINDINGS Cardiovascular: Preferential opacification of the thoracic aorta. Normal caliber of the thoracic aorta, which is mildly tortuous, with mild mixed calcific atherosclerosis. No evidence of aneurysm, dissection, or other acute aortic syndrome. Normal heart size. Scattered coronary artery calcifications including of the left main coronary artery. No pericardial effusion. Mediastinum/Nodes: No enlarged mediastinal, hilar, or axillary lymph nodes. Thyroid gland, trachea, and esophagus demonstrate no significant findings. Lungs/Pleura: Minimal centrilobular emphysema and pulmonary hyperinflation. Partially calcified,  benign nodule of the medial right lung base (series 6, image 50). No pleural effusion or pneumothorax. Musculoskeletal: No chest wall abnormality. No acute or significant osseous findings. Review of the MIP images confirms the above findings. CTA ABDOMEN AND PELVIS FINDINGS VASCULAR Normal contour and caliber of the abdominal aorta, with mild to moderate mixed calcific atherosclerosis. Standard branching pattern of the abdominal aorta with solitary bilateral renal arteries. There is mixed calcific atherosclerosis at the branch  vessel origins without high-grade stenosis. Review of the MIP images confirms the above findings. NON-VASCULAR Hepatobiliary: No focal liver abnormality is seen. Status post cholecystectomy. Postoperative biliary ductal dilatation similar to prior examination. Pancreas: Unremarkable. Dilation of the central pancreatic duct up to approximately 8 mm without obstructing mass lesion identified, this appearance unchanged compared to prior examination. Spleen: Normal in size without significant abnormality. Adrenals/Urinary Tract: Adrenal glands are unremarkable. There is mild bilateral hydronephrosis and hydroureter without obstructing calculi or other lesion identified. The urinary bladder is distended. Stomach/Bowel: Stomach is within normal limits. Appendix appears normal. No evidence of bowel wall thickening, distention, or inflammatory changes. Descending and sigmoid diverticulosis. Lymphatic: No enlarged abdominal or pelvic lymph nodes. Reproductive: No mass or other significant abnormality. Other: Small bilateral inguinal hernias containing a single loop of nonobstructed small bowel on the right and a single loop of nonobstructed sigmoid colon on the left (series 6, image 150). No abdominopelvic ascites. Musculoskeletal: No acute or significant osseous findings. Review of the MIP images confirms the above findings. IMPRESSION: 1. Normal caliber of the thoracic and abdominal aorta without  evidence of aneurysm, dissection, or other acute aortic pathology. Generally mild to moderate mixed atherosclerosis throughout. Aortic Atherosclerosis (ICD10-I70.0). 2. Mild bilateral hydronephrosis and hydroureter without obstructing calculi or other lesion identified, likely due to back pressure given distended urinary bladder. Correlate for urinary retention. 3. Coronary artery disease. 4.  Emphysema (ICD10-J43.9). 5. Other chronic, incidental, and postoperative findings as detailed above. Electronically Signed   By: Eddie Candle M.D.   On: 05/20/2019 13:09    Medications:   . aspirin EC  81 mg Oral Daily  . atorvastatin  40 mg Oral q1800  . Chlorhexidine Gluconate Cloth  6 each Topical Daily  . metoprolol tartrate  12.5 mg Oral BID  . pantoprazole  40 mg Oral Daily  . sodium chloride flush  3 mL Intravenous Q12H   Continuous Infusions: . sodium chloride    . calcium gluconate 1,000 mg (05/21/19 1204)  . heparin 700 Units/hr (05/21/19 0929)     LOS: 0 days   Geradine Girt  [facility]   How to contact the [facility] Attending or Consulting provider [place] or covering provider during after hours [place], for this patient?  1. Check the care team in [facility] and look for a) attending/consulting [facility] provider listed and b) the [facility] team listed 2. Log into [email or web address] and use [place]'s universal password to access. If you do not have the password, please contact the hospital operator. 3. Locate the [facility] provider you are looking for under [facility] and page to a number that you can be directly reached. 4. If you still have difficulty reaching the provider, please page the [facility] (Director on Call) for the Hospitalists listed on amion for assistance.  05/21/2019, 12:07 PM

## 2019-05-21 NOTE — Progress Notes (Signed)
Subjective:  Patient denies any chest pain or shortness of breath at present had occasional episodes of confusion hyponatremia is resolved.  High-sensitivity troponin I minimally elevated EKG showed no ischemic changes. Objective:  Vital Signs in the last 24 hours: Temp:  [98.3 F (36.8 C)-98.9 F (37.2 C)] 98.4 F (36.9 C) (02/27 0752) Pulse Rate:  [59-82] 71 (02/27 0752) Resp:  [11-23] 18 (02/27 0752) BP: (122-169)/(68-88) 155/85 (02/27 0752) SpO2:  [94 %-100 %] 100 % (02/27 0752) Weight:  [46.9 kg-51.8 kg] 46.9 kg (02/27 0428)  Intake/Output from previous day: 02/26 0701 - 02/27 0700 In: -  Out: 3000 [Urine:3000] Intake/Output from this shift: Total I/O In: -  Out: 1400 [Urine:1400]  Physical Exam: Neck: no adenopathy, no carotid bruit, no JVD and supple, symmetrical, trachea midline Lungs: clear to auscultation bilaterally Heart: regular rate and rhythm, S1, S2 normal and 2/6 systolic murmur noted Abdomen: soft, non-tender; bowel sounds normal; no masses,  no organomegaly Extremities: extremities normal, atraumatic, no cyanosis or edema  Lab Results: Recent Labs    05/20/19 1207 05/20/19 1207 05/20/19 1240 05/21/19 0558  WBC 7.3  --   --  4.5  HGB 12.1   < > 12.9 11.7*  PLT 238  --   --  206   < > = values in this interval not displayed.   Recent Labs    05/20/19 1207 05/20/19 1207 05/20/19 1240 05/21/19 0558  NA 122*   < > 120* 140  K 3.4*   < > 3.1* 3.5  CL 90*   < > 91* 106  CO2 21*  --   --  26  GLUCOSE 181*   < > 134* 78  BUN <5*   < > 4* <5*  CREATININE 0.87   < > 0.50 0.64   < > = values in this interval not displayed.   No results for input(s): TROPONINI in the last 72 hours.  Invalid input(s): CK, MB Hepatic Function Panel No results for input(s): PROT, ALBUMIN, AST, ALT, ALKPHOS, BILITOT, BILIDIR, IBILI in the last 72 hours. Recent Labs    05/21/19 0558  CHOL 158   No results for input(s): PROTIME in the last 72  hours.  Imaging: Imaging results have been reviewed and DG Chest Portable 1 View  Result Date: 05/20/2019 CLINICAL DATA:  Chest pain EXAM: PORTABLE CHEST 1 VIEW COMPARISON:  None. FINDINGS: The heart size and mediastinal contours are within normal limits. Both lungs are clear. No pleural effusion or pneumothorax. The visualized skeletal structures are unremarkable. IMPRESSION: No acute process in the chest. Electronically Signed   By: Macy Mis M.D.   On: 05/20/2019 12:37   ECHOCARDIOGRAM COMPLETE  Result Date: 05/21/2019    ECHOCARDIOGRAM REPORT   Patient Name:   Donna Butler Date of Exam: 05/21/2019 Medical Rec #:  [id number]            Height:       61.0 in Accession #:    [id number]           Weight:       103.5 lb Date of Birth:  [birth year]            BSA:          1.428 m Patient Age:    85 years             BP:           145/71 mmHg Patient Gender: F  HR:           66 bpm. Exam Location:  Inpatient Procedure: 2D Echo, Cardiac Doppler and Color Doppler Indications:     Chest Pain 786.50  History:         Patient has no prior history of Echocardiogram examinations.                  Signs/Symptoms:Chest Pain; Risk Factors:Hypertension,                  Dyslipidemia and Non-Smoker.  Sonographer:     Vickie Epley RDCS Referring Phys:  [place] Diagnosing Phys: Dixie Dials MD  Sonographer Comments: Suboptimal parasternal window. IMPRESSIONS  1. Left ventricular ejection fraction, by estimation, is 60 to 65%. The left ventricle has normal function. The left ventricle has no regional wall motion abnormalities. Left ventricular diastolic parameters are consistent with Grade I diastolic dysfunction (impaired relaxation).  2. Right ventricular systolic function is normal. The right ventricular size is normal. There is mildly elevated pulmonary artery systolic pressure.  3. Left atrial size was mildly dilated.  4. Right atrial size was mildly dilated.  5. The mitral valve is  degenerative. Mild to moderate mitral valve regurgitation. No evidence of mitral stenosis.  6. The aortic valve is tricuspid. Aortic valve regurgitation is mild. Mild aortic valve stenosis.  7. Aortic Calcific Aortic root and ascending aorta. There is Severe (Grade IV) atheroma plaque involving the aortic root and ascending aorta.  8. The inferior vena cava is normal in size with greater than 50% respiratory variability, suggesting right atrial pressure of 3 mmHg. FINDINGS  Left Ventricle: Left ventricular ejection fraction, by estimation, is 60 to 65%. The left ventricle has normal function. The left ventricle has no regional wall motion abnormalities. The left ventricular internal cavity size was normal in size. There is  no left ventricular hypertrophy. Left ventricular diastolic parameters are consistent with Grade I diastolic dysfunction (impaired relaxation). Right Ventricle: The right ventricular size is normal. No increase in right ventricular wall thickness. Right ventricular systolic function is normal. There is mildly elevated pulmonary artery systolic pressure. The tricuspid regurgitant velocity is 2.65  m/s, and with an assumed right atrial pressure of 3 mmHg, the estimated right ventricular systolic pressure is 16.1 mmHg. Left Atrium: Left atrial size was mildly dilated. Right Atrium: Right atrial size was mildly dilated. Pericardium: There is no evidence of pericardial effusion. Mitral Valve: The mitral valve is degenerative in appearance. There is mild thickening of the mitral valve leaflet(s). There is moderate calcification of the mitral valve leaflet(s). Normal mobility of the mitral valve leaflets. Moderate to severe mitral  annular calcification. Mild to moderate mitral valve regurgitation. No evidence of mitral valve stenosis. Tricuspid Valve: The tricuspid valve is grossly normal. Tricuspid valve regurgitation is mild. Aortic Valve: The aortic valve is tricuspid. . There is mild thickening and  moderate calcification of the aortic valve. Aortic valve regurgitation is mild. Mild aortic stenosis is present. Moderate aortic valve annular calcification. There is mild thickening of the aortic valve. There is moderate calcification of the aortic valve. Pulmonic Valve: The pulmonic valve was grossly normal. Pulmonic valve regurgitation is trivial. Aorta: Calcific Aortic root and ascending aorta. There is severe (Grade IV) atheroma plaque involving the aortic root and ascending aorta. Venous: The inferior vena cava is normal in size with greater than 50% respiratory variability, suggesting right atrial pressure of 3 mmHg. IAS/Shunts: The interatrial septum was not assessed.  LEFT VENTRICLE  PLAX 2D LVIDd:         3.80 cm     Diastology LVIDs:         3.00 cm     LV e' lateral:   9.29 cm/s LV PW:         0.90 cm     LV E/e' lateral: 7.4 LV IVS:        0.90 cm     LV e' medial:    5.17 cm/s LVOT diam:     1.90 cm     LV E/e' medial:  13.3 LV SV:         59 LV SV Index:   41 LVOT Area:     2.84 cm  LV Volumes (MOD) LV vol d, MOD A2C: 73.2 ml LV vol d, MOD A4C: 67.3 ml LV vol s, MOD A2C: 27.4 ml LV vol s, MOD A4C: 23.4 ml LV SV MOD A2C:     45.8 ml LV SV MOD A4C:     67.3 ml LV SV MOD BP:      46.9 ml RIGHT VENTRICLE RV S prime:     8.49 cm/s TAPSE (M-mode): 1.8 cm LEFT ATRIUM             Index       RIGHT ATRIUM           Index LA diam:        2.90 cm 2.03 cm/m  RA Area:     10.60 cm LA Vol (A2C):   30.1 ml 21.09 ml/m RA Volume:   22.50 ml  15.76 ml/m LA Vol (A4C):   15.8 ml 11.07 ml/m LA Biplane Vol: 22.6 ml 15.83 ml/m  AORTIC VALVE LVOT Vmax:   84.20 cm/s LVOT Vmean:  59.900 cm/s LVOT VTI:    0.208 m  AORTA Ao Root diam: 3.00 cm MITRAL VALVE               TRICUSPID VALVE MV Area (PHT): 2.39 cm    TR Peak grad:   28.1 mmHg MV Decel Time: 317 msec    TR Vmax:        265.00 cm/s MV E velocity: 68.70 cm/s MV A velocity: 88.60 cm/s  SHUNTS MV E/A ratio:  0.78        Systemic VTI:  0.21 m                             Systemic Diam: 1.90 cm Dixie Dials MD Electronically signed by Dixie Dials MD Signature Date/Time: 05/21/2019/11:56:00 AM    Final    CT Angio Chest/Abd/Pel for Dissection W and/or Wo Contrast  Result Date: 05/20/2019 CLINICAL DATA:  Chest pain, acute aortic syndrome suspected EXAM: CT ANGIOGRAPHY CHEST, ABDOMEN AND PELVIS TECHNIQUE: Multidetector CT imaging through the chest, abdomen and pelvis was performed using the standard protocol during bolus administration of intravenous contrast. Multiplanar reconstructed images and MIPs were obtained and reviewed to evaluate the vascular anatomy. CONTRAST:  143m OMNIPAQUE IOHEXOL 350 MG/ML SOLN COMPARISON:  Same-day chest radiographs, CT abdomen pelvis, 07/08/2017 FINDINGS: CTA CHEST FINDINGS Cardiovascular: Preferential opacification of the thoracic aorta. Normal caliber of the thoracic aorta, which is mildly tortuous, with mild mixed calcific atherosclerosis. No evidence of aneurysm, dissection, or other acute aortic syndrome. Normal heart size. Scattered coronary artery calcifications including of the left main coronary artery. No pericardial effusion. Mediastinum/Nodes: No enlarged mediastinal, hilar, or axillary lymph nodes. Thyroid gland,  trachea, and esophagus demonstrate no significant findings. Lungs/Pleura: Minimal centrilobular emphysema and pulmonary hyperinflation. Partially calcified, benign nodule of the medial right lung base (series 6, image 50). No pleural effusion or pneumothorax. Musculoskeletal: No chest wall abnormality. No acute or significant osseous findings. Review of the MIP images confirms the above findings. CTA ABDOMEN AND PELVIS FINDINGS VASCULAR Normal contour and caliber of the abdominal aorta, with mild to moderate mixed calcific atherosclerosis. Standard branching pattern of the abdominal aorta with solitary bilateral renal arteries. There is mixed calcific atherosclerosis at the branch vessel origins without high-grade stenosis.  Review of the MIP images confirms the above findings. NON-VASCULAR Hepatobiliary: No focal liver abnormality is seen. Status post cholecystectomy. Postoperative biliary ductal dilatation similar to prior examination. Pancreas: Unremarkable. Dilation of the central pancreatic duct up to approximately 8 mm without obstructing mass lesion identified, this appearance unchanged compared to prior examination. Spleen: Normal in size without significant abnormality. Adrenals/Urinary Tract: Adrenal glands are unremarkable. There is mild bilateral hydronephrosis and hydroureter without obstructing calculi or other lesion identified. The urinary bladder is distended. Stomach/Bowel: Stomach is within normal limits. Appendix appears normal. No evidence of bowel wall thickening, distention, or inflammatory changes. Descending and sigmoid diverticulosis. Lymphatic: No enlarged abdominal or pelvic lymph nodes. Reproductive: No mass or other significant abnormality. Other: Small bilateral inguinal hernias containing a single loop of nonobstructed small bowel on the right and a single loop of nonobstructed sigmoid colon on the left (series 6, image 150). No abdominopelvic ascites. Musculoskeletal: No acute or significant osseous findings. Review of the MIP images confirms the above findings. IMPRESSION: 1. Normal caliber of the thoracic and abdominal aorta without evidence of aneurysm, dissection, or other acute aortic pathology. Generally mild to moderate mixed atherosclerosis throughout. Aortic Atherosclerosis (ICD10-I70.0). 2. Mild bilateral hydronephrosis and hydroureter without obstructing calculi or other lesion identified, likely due to back pressure given distended urinary bladder. Correlate for urinary retention. 3. Coronary artery disease. 4.  Emphysema (ICD10-J43.9). 5. Other chronic, incidental, and postoperative findings as detailed above. Electronically Signed   By: Eddie Candle M.D.   On: 05/20/2019 13:09    Cardiac  Studies:  Assessment/Plan:  Acute coronary syndrome Hypertension, essential Elevated blood sugar Hyperlipidemia Dementia  Status post hyponatremia Plan Reschedule for nuclear stress test in a.m.  LOS: 0 days    Charolette Forward 05/21/2019, 12:15 PM

## 2019-05-21 NOTE — Progress Notes (Signed)
  Echocardiogram 2D Echocardiogram has been performed.  Michiel Cowboy 05/21/2019, 9:56 AM

## 2019-05-21 NOTE — Progress Notes (Signed)
Patient pulled foley out earlier when agitated and now has hematuria.  Will d/c heparin.  And monitor for clots.  Can flush if needed to avoid clogging of tube.

## 2019-05-21 NOTE — Care Management Obs Status (Signed)
[facility] NOTIFICATION   Patient Details  Name: Donna Butler MRN: [id number] Date of Birth: 03/31/1934   Medicare Observation Status Notification Given:  Yes    Carles Collet, RN 05/21/2019, 4:32 PM

## 2019-05-22 ENCOUNTER — Observation Stay (HOSPITAL_COMMUNITY): Payer: Medicare HMO

## 2019-05-22 DIAGNOSIS — F0391 Unspecified dementia with behavioral disturbance: Secondary | ICD-10-CM | POA: Diagnosis not present

## 2019-05-22 DIAGNOSIS — I208 Other forms of angina pectoris: Secondary | ICD-10-CM | POA: Diagnosis not present

## 2019-05-22 DIAGNOSIS — E871 Hypo-osmolality and hyponatremia: Secondary | ICD-10-CM | POA: Diagnosis not present

## 2019-05-22 LAB — CBC
HCT: 35.8 % — ABNORMAL LOW (ref 36.0–46.0)
Hemoglobin: 11.5 g/dL — ABNORMAL LOW (ref 12.0–15.0)
MCH: 27.4 pg (ref 26.0–34.0)
MCHC: 32.1 g/dL (ref 30.0–36.0)
MCV: 85.2 fL (ref 80.0–100.0)
Platelets: 211 10*3/uL (ref 150–400)
RBC: 4.2 MIL/uL (ref 3.87–5.11)
RDW: 14.4 % (ref 11.5–15.5)
WBC: 5.5 10*3/uL (ref 4.0–10.5)
nRBC: 0 % (ref 0.0–0.2)

## 2019-05-22 LAB — URINALYSIS, ROUTINE W REFLEX MICROSCOPIC
Bilirubin Urine: NEGATIVE
Glucose, UA: NEGATIVE mg/dL
Ketones, ur: NEGATIVE mg/dL
Leukocytes,Ua: NEGATIVE
Nitrite: NEGATIVE
Protein, ur: NEGATIVE mg/dL
Specific Gravity, Urine: 1.003 — ABNORMAL LOW (ref 1.005–1.030)
pH: 6 (ref 5.0–8.0)

## 2019-05-22 LAB — BASIC METABOLIC PANEL
Anion gap: 6 (ref 5–15)
BUN: 7 mg/dL — ABNORMAL LOW (ref 8–23)
CO2: 29 mmol/L (ref 22–32)
Calcium: 9.3 mg/dL (ref 8.9–10.3)
Chloride: 105 mmol/L (ref 98–111)
Creatinine, Ser: 0.69 mg/dL (ref 0.44–1.00)
GFR calc Af Amer: >60 mL/min (ref >60)
GFR calc non Af Amer: >60 mL/min (ref >60)
Glucose, Bld: 80 mg/dL (ref 70–99)
Potassium: 3.9 mmol/L (ref 3.5–5.1)
Sodium: 140 mmol/L (ref 135–145)

## 2019-05-22 LAB — CALCIUM, IONIZED: Calcium, Ionized, Serum: 5.4 mg/dL (ref 4.5–5.6)

## 2019-05-22 LAB — MAGNESIUM: Magnesium: 1.9 mg/dL (ref 1.7–2.4)

## 2019-05-22 MED ORDER — TECHNETIUM TC 99M TETROFOSMIN IV KIT
10.7100 | PACK | Freq: Once | INTRAVENOUS | Status: AC | PRN
  Administered 2019-05-22: 10.71 via INTRAVENOUS

## 2019-05-22 MED ORDER — METOPROLOL TARTRATE 25 MG PO TABS: 12.5000 mg | ORAL_TABLET | Freq: Two times a day (BID) | ORAL | 0 refills | Status: DC

## 2019-05-22 MED ORDER — TECHNETIUM TC 99M TETROFOSMIN IV KIT
31.6000 | PACK | Freq: Once | INTRAVENOUS | Status: AC | PRN
  Administered 2019-05-22: 09:00:00 31.6 via INTRAVENOUS

## 2019-05-22 MED ORDER — REGADENOSON 0.4 MG/5ML IV SOLN
INTRAVENOUS | Status: AC
  Filled 2019-05-22: qty 5

## 2019-05-22 MED ORDER — ASPIRIN 81 MG PO TBEC: 81.0000 mg | DELAYED_RELEASE_TABLET | Freq: Every day | ORAL | Status: DC

## 2019-05-22 MED ORDER — PANTOPRAZOLE SODIUM 40 MG PO TBEC: 40.0000 mg | DELAYED_RELEASE_TABLET | Freq: Every day | ORAL | 0 refills | Status: DC

## 2019-05-22 MED ORDER — REGADENOSON 0.4 MG/5ML IV SOLN
0.4000 mg | Freq: Once | INTRAVENOUS | Status: AC
  Administered 2019-05-22: 0.4 mg via INTRAVENOUS

## 2019-05-22 NOTE — Discharge Summary (Signed)
Physician Discharge Summary  Donna Butler GUR:[id number] DOB: 11/18/1934 DOA: 05/20/2019  PCP: Jilda Panda, MD  Admit date: 05/20/2019 Discharge date: 05/22/2019  Admitted From: Home Discharge disposition: Home   Recommendations for Outpatient Follow-Up:   1. Follow calcium level outpatient Consider referral to neuropsych for full mental status evaluation for dementia PPI added Encourage daughter to implement a bowel regimen BMP 1 week regarding calcium and sodium  Discharge Diagnosis:   Principal Problem:   Chest pain Active Problems:   Hypertension   Dyslipidemia   Dementia with behavioral disturbance ([place])   Bilateral hydronephrosis   Tobacco dependence due to chewing tobacco    Discharge Condition: Improved.  Diet recommendation: Low sodium, heart healthy  Wound care: None.  Code status: Full.   History of Present Illness:   Donna Butler is a 85 y.o. female with medical history significant of HTN and HLD presenting with chest pain.  She called her daughter this AM, complaining of chest pain.  She had L axillary/chest pain.  It started while drinking water.  It lasted "a long time".  She started feeling better in the ER.  The pain has resolved.  No prior h/o chest pain evaluation or CAD.  She felt well yesterday and slept ok.  The patient has been evaluated by her PCP for dementia but had a "negative scan" and so her daughter was told she does not have it.  On evaluation, it appears extremely likely that patient does, in fact, have dementia.  Her daughter reports that she is "mean" to her and her daughter is exhausted in trying to care for her.  However, her daughter does not desire to have her placed.   Hospital Course by Problem:   Chest pain -Started after drinking water-question esophageal spasm -CXR unremarkable.  -Troponin slightly elevated -EKG not indicative of acute ischemia.  -HEART pathway score is5, indicating that the  patient has an elevated risk score and requires further evaluation. -Start ASA 81 mg daily -Echo: Patient has normal wall motion abnormalities and an EF of 60 to 65% -Stress test low risk with no reversible ischemia  HTN -She is on lisinopril but has been inadvertently taking 40 mg daily for the last week instead of 20 mg daily - will resume prior dose -Will add low-dose metoprolol  HLD -LDL 67  Dementia -Daughter voiced concerns about dementia with behavioral disturbance - dementia appears extremely likely based on exam versus behavioral issues due to urinary retention, this is less likely as patient continued to have the same issues with the Foley in -Urinalysis did not show signs of infection -Outpatient referral to neuropsych  Hydronephrosis -Patient pulled out Foley on 2/27 -Patient urinated on own without any sign of blood on 228 prior to discharge  Tobacco dependence -Encourage cessationof oral tobacco -Patchdeclined.  Hyponatremia -Question related to urinary retention -Resolved with IV fluids and Foley -Outpatient follow-up  Low calcium Replete -Outpatient follow-up    Medical Consultants:      Discharge Exam:   Vitals:   05/22/19 1040 05/22/19 1138  BP: 135/70 (!) 144/75  Pulse: 93 66  Resp: 16 18  Temp: 97.8 F (36.6 C) 98 F (36.7 C)  SpO2: 98% 100%   Vitals:   05/22/19 0920 05/22/19 0923 05/22/19 1040 05/22/19 1138  BP: (!) 162/82 (!) 166/81 135/70 (!) 144/75  Pulse:   93 66  Resp:   16 18  Temp:   97.8 F (36.6 C) 98 F (36.7  C)  TempSrc:   Oral Oral  SpO2:   98% 100%  Weight:      Height:        General exam: Appears calm and comfortable.    The results of significant diagnostics from this hospitalization (including imaging, microbiology, ancillary and laboratory) are listed below for reference.     Procedures and Diagnostic Studies:   DG Chest Portable 1 View  Result Date: 05/20/2019 CLINICAL DATA:  Chest pain  EXAM: PORTABLE CHEST 1 VIEW COMPARISON:  None. FINDINGS: The heart size and mediastinal contours are within normal limits. Both lungs are clear. No pleural effusion or pneumothorax. The visualized skeletal structures are unremarkable. IMPRESSION: No acute process in the chest. Electronically Signed   By: Macy Mis M.D.   On: 05/20/2019 12:37   ECHOCARDIOGRAM COMPLETE  Result Date: 05/21/2019    ECHOCARDIOGRAM REPORT   Patient Name:   Donna Butler Alan Date of Exam: 05/21/2019 Medical Rec #:  [id number]            Height:       61.0 in Accession #:    [id number]           Weight:       103.5 lb Date of Birth:  01/06/1935            BSA:          1.428 m Patient Age:    57 years             BP:           145/71 mmHg Patient Gender: F                    HR:           66 bpm. Exam Location:  Inpatient Procedure: 2D Echo, Cardiac Doppler and Color Doppler Indications:     Chest Pain 786.50  History:         Patient has no prior history of Echocardiogram examinations.                  Signs/Symptoms:Chest Pain; Risk Factors:Hypertension,                  Dyslipidemia and Non-Smoker.  Sonographer:     Vickie Epley RDCS Referring Phys:  [place] Diagnosing Phys: Dixie Dials MD  Sonographer Comments: Suboptimal parasternal window. IMPRESSIONS  1. Left ventricular ejection fraction, by estimation, is 60 to 65%. The left ventricle has normal function. The left ventricle has no regional wall motion abnormalities. Left ventricular diastolic parameters are consistent with Grade I diastolic dysfunction (impaired relaxation).  2. Right ventricular systolic function is normal. The right ventricular size is normal. There is mildly elevated pulmonary artery systolic pressure.  3. Left atrial size was mildly dilated.  4. Right atrial size was mildly dilated.  5. The mitral valve is degenerative. Mild to moderate mitral valve regurgitation. No evidence of mitral stenosis.  6. The aortic valve is tricuspid. Aortic  valve regurgitation is mild. Mild aortic valve stenosis.  7. Aortic Calcific Aortic root and ascending aorta. There is Severe (Grade IV) atheroma plaque involving the aortic root and ascending aorta.  8. The inferior vena cava is normal in size with greater than 50% respiratory variability, suggesting right atrial pressure of 3 mmHg. FINDINGS  Left Ventricle: Left ventricular ejection fraction, by estimation, is 60 to 65%. The left ventricle has normal function. The left ventricle has no regional wall motion  abnormalities. The left ventricular internal cavity size was normal in size. There is  no left ventricular hypertrophy. Left ventricular diastolic parameters are consistent with Grade I diastolic dysfunction (impaired relaxation). Right Ventricle: The right ventricular size is normal. No increase in right ventricular wall thickness. Right ventricular systolic function is normal. There is mildly elevated pulmonary artery systolic pressure. The tricuspid regurgitant velocity is 2.65  m/s, and with an assumed right atrial pressure of 3 mmHg, the estimated right ventricular systolic pressure is 27.7 mmHg. Left Atrium: Left atrial size was mildly dilated. Right Atrium: Right atrial size was mildly dilated. Pericardium: There is no evidence of pericardial effusion. Mitral Valve: The mitral valve is degenerative in appearance. There is mild thickening of the mitral valve leaflet(s). There is moderate calcification of the mitral valve leaflet(s). Normal mobility of the mitral valve leaflets. Moderate to severe mitral  annular calcification. Mild to moderate mitral valve regurgitation. No evidence of mitral valve stenosis. Tricuspid Valve: The tricuspid valve is grossly normal. Tricuspid valve regurgitation is mild. Aortic Valve: The aortic valve is tricuspid. . There is mild thickening and moderate calcification of the aortic valve. Aortic valve regurgitation is mild. Mild aortic stenosis is present. Moderate aortic  valve annular calcification. There is mild thickening of the aortic valve. There is moderate calcification of the aortic valve. Pulmonic Valve: The pulmonic valve was grossly normal. Pulmonic valve regurgitation is trivial. Aorta: Calcific Aortic root and ascending aorta. There is severe (Grade IV) atheroma plaque involving the aortic root and ascending aorta. Venous: The inferior vena cava is normal in size with greater than 50% respiratory variability, suggesting right atrial pressure of 3 mmHg. IAS/Shunts: The interatrial septum was not assessed.  LEFT VENTRICLE PLAX 2D LVIDd:         3.80 cm     Diastology LVIDs:         3.00 cm     LV e' lateral:   9.29 cm/s LV PW:         0.90 cm     LV E/e' lateral: 7.4 LV IVS:        0.90 cm     LV e' medial:    5.17 cm/s LVOT diam:     1.90 cm     LV E/e' medial:  13.3 LV SV:         59 LV SV Index:   41 LVOT Area:     2.84 cm  LV Volumes (MOD) LV vol d, MOD A2C: 73.2 ml LV vol d, MOD A4C: 67.3 ml LV vol s, MOD A2C: 27.4 ml LV vol s, MOD A4C: 23.4 ml LV SV MOD A2C:     45.8 ml LV SV MOD A4C:     67.3 ml LV SV MOD BP:      46.9 ml RIGHT VENTRICLE RV S prime:     8.49 cm/s TAPSE (M-mode): 1.8 cm LEFT ATRIUM             Index       RIGHT ATRIUM           Index LA diam:        2.90 cm 2.03 cm/m  RA Area:     10.60 cm LA Vol (A2C):   30.1 ml 21.09 ml/m RA Volume:   22.50 ml  15.76 ml/m LA Vol (A4C):   15.8 ml 11.07 ml/m LA Biplane Vol: 22.6 ml 15.83 ml/m  AORTIC VALVE LVOT Vmax:   84.20 cm/s LVOT Vmean:  59.900  cm/s LVOT VTI:    0.208 m  AORTA Ao Root diam: 3.00 cm MITRAL VALVE               TRICUSPID VALVE MV Area (PHT): 2.39 cm    TR Peak grad:   28.1 mmHg MV Decel Time: 317 msec    TR Vmax:        265.00 cm/s MV E velocity: 68.70 cm/s MV A velocity: 88.60 cm/s  SHUNTS MV E/A ratio:  0.78        Systemic VTI:  0.21 m                            Systemic Diam: 1.90 cm Dixie Dials MD Electronically signed by Dixie Dials MD Signature Date/Time: 05/21/2019/11:56:00 AM     Final    CT Angio Chest/Abd/Pel for Dissection W and/or Wo Contrast  Result Date: 05/20/2019 CLINICAL DATA:  Chest pain, acute aortic syndrome suspected EXAM: CT ANGIOGRAPHY CHEST, ABDOMEN AND PELVIS TECHNIQUE: Multidetector CT imaging through the chest, abdomen and pelvis was performed using the standard protocol during bolus administration of intravenous contrast. Multiplanar reconstructed images and MIPs were obtained and reviewed to evaluate the vascular anatomy. CONTRAST:  143m OMNIPAQUE IOHEXOL 350 MG/ML SOLN COMPARISON:  Same-day chest radiographs, CT abdomen pelvis, 07/08/2017 FINDINGS: CTA CHEST FINDINGS Cardiovascular: Preferential opacification of the thoracic aorta. Normal caliber of the thoracic aorta, which is mildly tortuous, with mild mixed calcific atherosclerosis. No evidence of aneurysm, dissection, or other acute aortic syndrome. Normal heart size. Scattered coronary artery calcifications including of the left main coronary artery. No pericardial effusion. Mediastinum/Nodes: No enlarged mediastinal, hilar, or axillary lymph nodes. Thyroid gland, trachea, and esophagus demonstrate no significant findings. Lungs/Pleura: Minimal centrilobular emphysema and pulmonary hyperinflation. Partially calcified, benign nodule of the medial right lung base (series 6, image 50). No pleural effusion or pneumothorax. Musculoskeletal: No chest wall abnormality. No acute or significant osseous findings. Review of the MIP images confirms the above findings. CTA ABDOMEN AND PELVIS FINDINGS VASCULAR Normal contour and caliber of the abdominal aorta, with mild to moderate mixed calcific atherosclerosis. Standard branching pattern of the abdominal aorta with solitary bilateral renal arteries. There is mixed calcific atherosclerosis at the branch vessel origins without high-grade stenosis. Review of the MIP images confirms the above findings. NON-VASCULAR Hepatobiliary: No focal liver abnormality is seen. Status  post cholecystectomy. Postoperative biliary ductal dilatation similar to prior examination. Pancreas: Unremarkable. Dilation of the central pancreatic duct up to approximately 8 mm without obstructing mass lesion identified, this appearance unchanged compared to prior examination. Spleen: Normal in size without significant abnormality. Adrenals/Urinary Tract: Adrenal glands are unremarkable. There is mild bilateral hydronephrosis and hydroureter without obstructing calculi or other lesion identified. The urinary bladder is distended. Stomach/Bowel: Stomach is within normal limits. Appendix appears normal. No evidence of bowel wall thickening, distention, or inflammatory changes. Descending and sigmoid diverticulosis. Lymphatic: No enlarged abdominal or pelvic lymph nodes. Reproductive: No mass or other significant abnormality. Other: Small bilateral inguinal hernias containing a single loop of nonobstructed small bowel on the right and a single loop of nonobstructed sigmoid colon on the left (series 6, image 150). No abdominopelvic ascites. Musculoskeletal: No acute or significant osseous findings. Review of the MIP images confirms the above findings. IMPRESSION: 1. Normal caliber of the thoracic and abdominal aorta without evidence of aneurysm, dissection, or other acute aortic pathology. Generally mild to moderate mixed atherosclerosis throughout. Aortic Atherosclerosis (ICD10-I70.0). 2. Mild bilateral  hydronephrosis and hydroureter without obstructing calculi or other lesion identified, likely due to back pressure given distended urinary bladder. Correlate for urinary retention. 3. Coronary artery disease. 4.  Emphysema (ICD10-J43.9). 5. Other chronic, incidental, and postoperative findings as detailed above. Electronically Signed   By: Eddie Candle M.D.   On: 05/20/2019 13:09     Labs:   Basic Metabolic Panel: Recent Labs  Lab 05/20/19 1207 05/20/19 1207 05/20/19 1240 05/20/19 1240 05/21/19 0558  05/22/19 0323  NA 122*  --  120*  --  140 140  K 3.4*   < > 3.1*   < > 3.5 3.9  CL 90*  --  91*  --  106 105  CO2 21*  --   --   --  26 29  GLUCOSE 181*  --  134*  --  78 80  BUN <5*  --  4*  --  <5* 7*  CREATININE 0.87  --  0.50  --  0.64 0.69  CALCIUM 8.4*  --   --   --  9.1 9.3  MG  --   --   --   --   --  1.9   < > = values in this interval not displayed.   GFR Estimated Creatinine Clearance: 39.8 mL/min (by C-G formula based on SCr of 0.69 mg/dL). Liver Function Tests: No results for input(s): AST, ALT, ALKPHOS, BILITOT, PROT, ALBUMIN in the last 168 hours. No results for input(s): LIPASE, AMYLASE in the last 168 hours. No results for input(s): AMMONIA in the last 168 hours. Coagulation profile Recent Labs  Lab 05/20/19 1717  INR 1.0    CBC: Recent Labs  Lab 05/20/19 1207 05/20/19 1240 05/21/19 0558 05/22/19 0323  WBC 7.3  --  4.5 5.5  NEUTROABS 5.7  --   --   --   HGB 12.1 12.9 11.7* 11.5*  HCT 37.3 38.0 36.3 35.8*  MCV 85.7  --  85.4 85.2  PLT 238  --  206 211   Cardiac Enzymes: No results for input(s): CKTOTAL, CKMB, CKMBINDEX, TROPONINI in the last 168 hours. BNP: Invalid input(s): POCBNP CBG: No results for input(s): GLUCAP in the last 168 hours. D-Dimer No results for input(s): DDIMER in the last 72 hours. Hgb A1c Recent Labs    05/20/19 1716  HGBA1C 5.2   Lipid Profile Recent Labs    05/21/19 0558  CHOL 158  HDL 85  LDLCALC 67  TRIG 29  CHOLHDL 1.9   Thyroid function studies Recent Labs    05/20/19 1716  TSH 1.770   Anemia work up No results for input(s): VITAMINB12, FOLATE, FERRITIN, TIBC, IRON, RETICCTPCT in the last 72 hours. Microbiology Recent Results (from the past 240 hour(s))  Respiratory Panel by RT PCR (Flu A&B, Covid) - Nasopharyngeal Swab     Status: None   Collection Time: 05/20/19  2:15 PM   Specimen: Nasopharyngeal Swab  Result Value Ref Range Status   SARS Coronavirus 2 by RT PCR NEGATIVE NEGATIVE Final     Comment: (NOTE) SARS-CoV-2 target nucleic acids are NOT DETECTED. The SARS-CoV-2 RNA is generally detectable in upper respiratoy specimens during the acute phase of infection. The lowest concentration of SARS-CoV-2 viral copies this assay can detect is 131 copies/mL. A negative result does not preclude SARS-Cov-2 infection and should not be used as the sole basis for treatment or other patient management decisions. A negative result may occur with  improper specimen collection/handling, submission of specimen other than nasopharyngeal swab,  presence of viral mutation(s) within the areas targeted by this assay, and inadequate number of viral copies (<131 copies/mL). A negative result must be combined with clinical observations, patient history, and epidemiological information. The expected result is Negative. Fact Sheet for Patients:  PinkCheek.be Fact Sheet for Healthcare Providers:  GravelBags.it This test is not yet ap proved or cleared by the [place] FDA and  has been authorized for detection and/or diagnosis of SARS-CoV-2 by FDA under an Emergency Use Authorization (EUA). This EUA will remain  in effect (meaning this test can be used) for the duration of the COVID-19 declaration under Section 564(b)(1) of the Act, 21 U.S.C. section 360bbb-3(b)(1), unless the authorization is terminated or revoked sooner.    Influenza A by PCR NEGATIVE NEGATIVE Final   Influenza B by PCR NEGATIVE NEGATIVE Final    Comment: (NOTE) The Xpert Xpress SARS-CoV-2/FLU/RSV assay is intended as an aid in  the diagnosis of influenza from Nasopharyngeal swab specimens and  should not be used as a sole basis for treatment. Nasal washings and  aspirates are unacceptable for Xpert Xpress SARS-CoV-2/FLU/RSV  testing. Fact Sheet for Patients: PinkCheek.be Fact Sheet for Healthcare  Providers: GravelBags.it This test is not yet approved or cleared by the [place] FDA and  has been authorized for detection and/or diagnosis of SARS-CoV-2 by  FDA under an Emergency Use Authorization (EUA). This EUA will remain  in effect (meaning this test can be used) for the duration of the  Covid-19 declaration under Section 564(b)(1) of the Act, 21  U.S.C. section 360bbb-3(b)(1), unless the authorization is  terminated or revoked. Performed at [facility], [address], [place], [place] [address]      Discharge Instructions:   Discharge Instructions    Ambulatory referral to Neurology   Complete by: As directed    An appointment is requested in approximately: 2-3 weeks   Diet - low sodium heart healthy   Complete by: As directed    Increase activity slowly   Complete by: As directed      Allergies as of 05/22/2019      Reactions   Penicillins Other (See Comments)   Reaction not recalled Has patient had a PCN reaction causing immediate rash, facial/tongue/throat swelling, SOB or lightheadedness with hypotension: Unk Has patient had a PCN reaction causing severe rash involving mucus membranes or skin necrosis: No Has patient had a PCN reaction that required hospitalization: No Has patient had a PCN reaction occurring within the last 10 years: No If all of the above answers are "NO", then may proceed with Cephalosporin use.      Medication List    TAKE these medications   acetaminophen 500 MG tablet Commonly known as: TYLENOL Take 500-1,000 mg by mouth every 6 (six) hours as needed for mild pain or headache.   aspirin 81 MG EC tablet Take 1 tablet (81 mg total) by mouth daily. Start taking on: May 23, 2019   lisinopril 20 MG tablet Commonly known as: ZESTRIL Take 20 mg by mouth daily.   lovastatin 40 MG tablet Commonly known as: MEVACOR Take 80 mg by mouth at bedtime.   metoprolol tartrate 25 MG tablet Commonly  known as: LOPRESSOR Take 0.5 tablets (12.5 mg total) by mouth 2 (two) times daily.   pantoprazole 40 MG tablet Commonly known as: PROTONIX Take 1 tablet (40 mg total) by mouth daily. Start taking on: May 23, 2019      Follow-up Information    Mellody Drown,  Carloyn Manner, MD Follow up in 1 week(s).   Specialty: Internal Medicine Contact information: [phone] [place] [address] [phone]            Time coordinating discharge: 35 minutes  Signed:  Geradine Girt DO  [facility] 05/22/2019, 2:02 PM

## 2019-05-22 NOTE — Progress Notes (Signed)
Subjective:  Patient denies any chest pain or shortness of breath seen in nuclear medicine department tolerated stress portion of the test okay with no acute ischemic changes on the EKG Lexiscan Myoview result is pending  Objective:  Vital Signs in the last 24 hours: Temp:  [98 F (36.7 C)-98.4 F (36.9 C)] 98 F (36.7 C) (02/28 0735) Pulse Rate:  [71-76] 71 (02/28 0735) Resp:  [16-18] 18 (02/28 0735) BP: (133-176)/(69-93) 166/81 (02/28 0923) SpO2:  [97 %-100 %] 99 % (02/28 0735) Weight:  [46.5 kg] 46.5 kg (02/28 0329)  Intake/Output from previous day: 02/27 0701 - 02/28 0700 In: 360 [P.O.:360] Out: 2175 [Urine:2175] Intake/Output from this shift: No intake/output data recorded.  Physical Exam: Exam unchanged  Lab Results: Recent Labs    05/21/19 0558 05/22/19 0323  WBC 4.5 5.5  HGB 11.7* 11.5*  PLT 206 211   Recent Labs    05/20/19 1207 05/20/19 1207 05/20/19 1240 05/21/19 0558  NA 122*   < > 120* 140  K 3.4*   < > 3.1* 3.5  CL 90*   < > 91* 106  CO2 21*  --   --  26  GLUCOSE 181*   < > 134* 78  BUN <5*   < > 4* <5*  CREATININE 0.87   < > 0.50 0.64   < > = values in this interval not displayed.   No results for input(s): TROPONINI in the last 72 hours.  Invalid input(s): CK, MB Hepatic Function Panel No results for input(s): PROT, ALBUMIN, AST, ALT, ALKPHOS, BILITOT, BILIDIR, IBILI in the last 72 hours. Recent Labs    05/21/19 0558  CHOL 158   No results for input(s): PROTIME in the last 72 hours.  Imaging: Imaging results have been reviewed and DG Chest Portable 1 View  Result Date: 05/20/2019 CLINICAL DATA:  Chest pain EXAM: PORTABLE CHEST 1 VIEW COMPARISON:  None. FINDINGS: The heart size and mediastinal contours are within normal limits. Both lungs are clear. No pleural effusion or pneumothorax. The visualized skeletal structures are unremarkable. IMPRESSION: No acute process in the chest. Electronically Signed   By: Macy Mis M.D.   On:  05/20/2019 12:37   ECHOCARDIOGRAM COMPLETE  Result Date: 05/21/2019    ECHOCARDIOGRAM REPORT   Patient Name:   Donna Butler Date of Exam: 05/21/2019 Medical Rec #:  [id number]            Height:       61.0 in Accession #:    [id number]           Weight:       103.5 lb Date of Birth:  [birth year]            BSA:          1.428 m Patient Age:    85 years             BP:           145/71 mmHg Patient Gender: F                    HR:           66 bpm. Exam Location:  Inpatient Procedure: 2D Echo, Cardiac Doppler and Color Doppler Indications:     Chest Pain 786.50  History:         Patient has no prior history of Echocardiogram examinations.  Signs/Symptoms:Chest Pain; Risk Factors:Hypertension,                  Dyslipidemia and Non-Smoker.  Sonographer:     Vickie Epley RDCS Referring Phys:  [place] Diagnosing Phys: Dixie Dials MD  Sonographer Comments: Suboptimal parasternal window. IMPRESSIONS  1. Left ventricular ejection fraction, by estimation, is 60 to 65%. The left ventricle has normal function. The left ventricle has no regional wall motion abnormalities. Left ventricular diastolic parameters are consistent with Grade I diastolic dysfunction (impaired relaxation).  2. Right ventricular systolic function is normal. The right ventricular size is normal. There is mildly elevated pulmonary artery systolic pressure.  3. Left atrial size was mildly dilated.  4. Right atrial size was mildly dilated.  5. The mitral valve is degenerative. Mild to moderate mitral valve regurgitation. No evidence of mitral stenosis.  6. The aortic valve is tricuspid. Aortic valve regurgitation is mild. Mild aortic valve stenosis.  7. Aortic Calcific Aortic root and ascending aorta. There is Severe (Grade IV) atheroma plaque involving the aortic root and ascending aorta.  8. The inferior vena cava is normal in size with greater than 50% respiratory variability, suggesting right atrial pressure of 3 mmHg.  FINDINGS  Left Ventricle: Left ventricular ejection fraction, by estimation, is 60 to 65%. The left ventricle has normal function. The left ventricle has no regional wall motion abnormalities. The left ventricular internal cavity size was normal in size. There is  no left ventricular hypertrophy. Left ventricular diastolic parameters are consistent with Grade I diastolic dysfunction (impaired relaxation). Right Ventricle: The right ventricular size is normal. No increase in right ventricular wall thickness. Right ventricular systolic function is normal. There is mildly elevated pulmonary artery systolic pressure. The tricuspid regurgitant velocity is 2.65  m/s, and with an assumed right atrial pressure of 3 mmHg, the estimated right ventricular systolic pressure is 31.4 mmHg. Left Atrium: Left atrial size was mildly dilated. Right Atrium: Right atrial size was mildly dilated. Pericardium: There is no evidence of pericardial effusion. Mitral Valve: The mitral valve is degenerative in appearance. There is mild thickening of the mitral valve leaflet(s). There is moderate calcification of the mitral valve leaflet(s). Normal mobility of the mitral valve leaflets. Moderate to severe mitral  annular calcification. Mild to moderate mitral valve regurgitation. No evidence of mitral valve stenosis. Tricuspid Valve: The tricuspid valve is grossly normal. Tricuspid valve regurgitation is mild. Aortic Valve: The aortic valve is tricuspid. . There is mild thickening and moderate calcification of the aortic valve. Aortic valve regurgitation is mild. Mild aortic stenosis is present. Moderate aortic valve annular calcification. There is mild thickening of the aortic valve. There is moderate calcification of the aortic valve. Pulmonic Valve: The pulmonic valve was grossly normal. Pulmonic valve regurgitation is trivial. Aorta: Calcific Aortic root and ascending aorta. There is severe (Grade IV) atheroma plaque involving the aortic  root and ascending aorta. Venous: The inferior vena cava is normal in size with greater than 50% respiratory variability, suggesting right atrial pressure of 3 mmHg. IAS/Shunts: The interatrial septum was not assessed.  LEFT VENTRICLE PLAX 2D LVIDd:         3.80 cm     Diastology LVIDs:         3.00 cm     LV e' lateral:   9.29 cm/s LV PW:         0.90 cm     LV E/e' lateral: 7.4 LV IVS:  0.90 cm     LV e' medial:    5.17 cm/s LVOT diam:     1.90 cm     LV E/e' medial:  13.3 LV SV:         59 LV SV Index:   41 LVOT Area:     2.84 cm  LV Volumes (MOD) LV vol d, MOD A2C: 73.2 ml LV vol d, MOD A4C: 67.3 ml LV vol s, MOD A2C: 27.4 ml LV vol s, MOD A4C: 23.4 ml LV SV MOD A2C:     45.8 ml LV SV MOD A4C:     67.3 ml LV SV MOD BP:      46.9 ml RIGHT VENTRICLE RV S prime:     8.49 cm/s TAPSE (M-mode): 1.8 cm LEFT ATRIUM             Index       RIGHT ATRIUM           Index LA diam:        2.90 cm 2.03 cm/m  RA Area:     10.60 cm LA Vol (A2C):   30.1 ml 21.09 ml/m RA Volume:   22.50 ml  15.76 ml/m LA Vol (A4C):   15.8 ml 11.07 ml/m LA Biplane Vol: 22.6 ml 15.83 ml/m  AORTIC VALVE LVOT Vmax:   84.20 cm/s LVOT Vmean:  59.900 cm/s LVOT VTI:    0.208 m  AORTA Ao Root diam: 3.00 cm MITRAL VALVE               TRICUSPID VALVE MV Area (PHT): 2.39 cm    TR Peak grad:   28.1 mmHg MV Decel Time: 317 msec    TR Vmax:        265.00 cm/s MV E velocity: 68.70 cm/s MV A velocity: 88.60 cm/s  SHUNTS MV E/A ratio:  0.78        Systemic VTI:  0.21 m                            Systemic Diam: 1.90 cm Dixie Dials MD Electronically signed by Dixie Dials MD Signature Date/Time: 05/21/2019/11:56:00 AM    Final    CT Angio Chest/Abd/Pel for Dissection W and/or Wo Contrast  Result Date: 05/20/2019 CLINICAL DATA:  Chest pain, acute aortic syndrome suspected EXAM: CT ANGIOGRAPHY CHEST, ABDOMEN AND PELVIS TECHNIQUE: Multidetector CT imaging through the chest, abdomen and pelvis was performed using the standard protocol during bolus  administration of intravenous contrast. Multiplanar reconstructed images and MIPs were obtained and reviewed to evaluate the vascular anatomy. CONTRAST:  127m OMNIPAQUE IOHEXOL 350 MG/ML SOLN COMPARISON:  Same-day chest radiographs, CT abdomen pelvis, 07/08/2017 FINDINGS: CTA CHEST FINDINGS Cardiovascular: Preferential opacification of the thoracic aorta. Normal caliber of the thoracic aorta, which is mildly tortuous, with mild mixed calcific atherosclerosis. No evidence of aneurysm, dissection, or other acute aortic syndrome. Normal heart size. Scattered coronary artery calcifications including of the left main coronary artery. No pericardial effusion. Mediastinum/Nodes: No enlarged mediastinal, hilar, or axillary lymph nodes. Thyroid gland, trachea, and esophagus demonstrate no significant findings. Lungs/Pleura: Minimal centrilobular emphysema and pulmonary hyperinflation. Partially calcified, benign nodule of the medial right lung base (series 6, image 50). No pleural effusion or pneumothorax. Musculoskeletal: No chest wall abnormality. No acute or significant osseous findings. Review of the MIP images confirms the above findings. CTA ABDOMEN AND PELVIS FINDINGS VASCULAR Normal contour and caliber of the abdominal aorta, with mild  to moderate mixed calcific atherosclerosis. Standard branching pattern of the abdominal aorta with solitary bilateral renal arteries. There is mixed calcific atherosclerosis at the branch vessel origins without high-grade stenosis. Review of the MIP images confirms the above findings. NON-VASCULAR Hepatobiliary: No focal liver abnormality is seen. Status post cholecystectomy. Postoperative biliary ductal dilatation similar to prior examination. Pancreas: Unremarkable. Dilation of the central pancreatic duct up to approximately 8 mm without obstructing mass lesion identified, this appearance unchanged compared to prior examination. Spleen: Normal in size without significant  abnormality. Adrenals/Urinary Tract: Adrenal glands are unremarkable. There is mild bilateral hydronephrosis and hydroureter without obstructing calculi or other lesion identified. The urinary bladder is distended. Stomach/Bowel: Stomach is within normal limits. Appendix appears normal. No evidence of bowel wall thickening, distention, or inflammatory changes. Descending and sigmoid diverticulosis. Lymphatic: No enlarged abdominal or pelvic lymph nodes. Reproductive: No mass or other significant abnormality. Other: Small bilateral inguinal hernias containing a single loop of nonobstructed small bowel on the right and a single loop of nonobstructed sigmoid colon on the left (series 6, image 150). No abdominopelvic ascites. Musculoskeletal: No acute or significant osseous findings. Review of the MIP images confirms the above findings. IMPRESSION: 1. Normal caliber of the thoracic and abdominal aorta without evidence of aneurysm, dissection, or other acute aortic pathology. Generally mild to moderate mixed atherosclerosis throughout. Aortic Atherosclerosis (ICD10-I70.0). 2. Mild bilateral hydronephrosis and hydroureter without obstructing calculi or other lesion identified, likely due to back pressure given distended urinary bladder. Correlate for urinary retention. 3. Coronary artery disease. 4.  Emphysema (ICD10-J43.9). 5. Other chronic, incidental, and postoperative findings as detailed above. Electronically Signed   By: Eddie Candle M.D.   On: 05/20/2019 13:09    Cardiac Studies:  Assessment/Plan:  Acute coronary syndrome Hypertension, essential Elevated blood sugar Emphysema Hyperlipidemia Dementia  Status post hyponatremia Plan Continue present management Okay to discharge from cardiac point of view if Lexiscan shows no evidence of ischemia  LOS: 0 days    Charolette Forward 05/22/2019, 10:03 AM

## 2019-05-22 NOTE — Discharge Instructions (Signed)
Dementia Caregiver Guide Dementia is a term used to describe a number of symptoms that affect memory and thinking. The most common symptoms include:  Memory loss.  Trouble with language and communication.  Trouble concentrating.  Poor judgment.  Problems with reasoning.  Child-like behavior and language.  Extreme anxiety.  Angry outbursts.  Wandering from home or public places. Dementia usually gets worse slowly over time. In the early stages, people with dementia can stay independent and safe with some help. In later stages, they need help with daily tasks such as dressing, grooming, and using the bathroom. How to help the person with dementia cope Dementia can be frightening and confusing. Here are some tips to help the person with dementia cope with changes caused by the disease. General tips  Keep the person on track with his or her routine.  Try to identify areas where the person may need help.  Be supportive, patient, calm, and encouraging.  Gently remind the person that adjusting to changes takes time.  Help with the tasks that the person has asked for help with.  Keep the person involved in daily tasks and decisions as much as possible.  Encourage conversation, but try not to get frustrated or harried if the person struggles to find words or does not seem to appreciate your help. Communication tips  When the person is talking or seems frustrated, make eye contact and hold the person's hand.  Ask specific questions that need yes or no answers.  Use simple words, short sentences, and a calm voice. Only give one direction at a time.  When offering choices, limit them to just 1 or 2.  Avoid correcting the person in a negative way.  If the person is struggling to find the right words, gently try to help him or her. How to recognize symptoms of stress Symptoms of stress in caregivers include:  Feeling frustrated or angry with the person with  dementia.  Denying that the person has dementia or that his or her symptoms will not improve.  Feeling hopeless and unappreciated.  Difficulty sleeping.  Difficulty concentrating.  Feeling anxious, irritable, or depressed.  Developing stress-related health problems.  Feeling like you have too little time for your own life. Follow these instructions at home:   Make sure that you and the person you are caring for: ? Get regular sleep. ? Exercise regularly. ? Eat regular, nutritious meals. ? Drink enough fluid to keep your urine clear or pale yellow. ? Take over-the-counter and prescription medicines only as told by your health care providers. ? Attend all scheduled health care appointments.  Join a support group with others who are caregivers.  Ask about respite care resources so that you can have a regular break from the stress of caregiving.  Look for signs of stress in yourself and in the person you are caring for. If you notice signs of stress, take steps to manage it.  Consider any safety risks and take steps to avoid them.  Organize medications in a pill box for each day of the week.  Create a plan to handle any legal or financial matters. Get legal or financial advice if needed.  Keep a calendar in a central location to remind the person of appointments or other activities. Tips for reducing the risk of injury  Keep floors clear of clutter. Remove rugs, magazine racks, and floor lamps.  Keep hallways well lit, especially at night.  Put a handrail and nonslip mat in the bathtub or  shower.  Put childproof locks on cabinets that contain dangerous items, such as medicines, alcohol, guns, toxic cleaning items, sharp tools or utensils, matches, and lighters.  Put the locks in places where the person cannot see or reach them easily. This will help ensure that the person does not wander out of the house and get lost.  Be prepared for emergencies. Keep a list of  emergency phone numbers and addresses in a convenient area.  Remove car keys and lock garage doors so that the person does not try to get in the car and drive.  Have the person wear a bracelet that tracks locations and identifies the person as having memory problems. This should be worn at all times for safety. Where to find support: Many individuals and organizations offer support. These include:  Support groups for people with dementia and for caregivers.  Counselors or therapists.  Home health care services.  Adult day care centers. Where to find more information Alzheimer's Association: [employer] Contact a health care provider if:  The person's health is rapidly getting worse.  You are no longer able to care for the person.  Caring for the person is affecting your physical and emotional health.  The person threatens himself or herself, you, or anyone else. Summary  Dementia is a term used to describe a number of symptoms that affect memory and thinking.  Dementia usually gets worse slowly over time.  Take steps to reduce the person's risk of injury, and to plan for future care.  Caregivers need support, relief from caregiving, and time for their own lives. This information is not intended to replace advice given to you by your health care provider. Make sure you discuss any questions you have with your health care provider. Document Revised: 02/20/2017 Document Reviewed: 02/12/2016 Elsevier Patient Education  [place].   Dementia Dementia is a condition that affects the way the brain works. It often affects memory and thinking. There are many types of dementia. Some types get worse with time and cannot be reversed. Some types of dementia include:  Alzheimer's disease. This is the most common type.  Vascular dementia. This type may happen due to a stroke.  Lewy body dementia. This type may happen to people who have Parkinson's disease.  Frontotemporal  dementia. This type is caused by damage to nerve cells in certain parts of the brain. Some people may have more than one type, and this is called mixed dementia. What are the causes? This condition is caused by damage to cells in the brain. Some causes that cannot be reversed include:  Having a condition that affects the blood vessels of the brain, such as diabetes, heart disease, or blood vessel disease.  Changes to genes. Some causes that can be reversed or slowed include:  Injury to the brain.  Certain medicines.  Infection.  Not having enough vitamin B12 in the body, or thyroid problems.  A tumor or blood clot in the brain. What are the signs or symptoms? Symptoms depend on the type of dementia. This may include:  Problems remembering things.  Having trouble taking a bath or putting clothes on.  Forgetting appointments.  Forgetting to pay bills.  Trouble planning and making meals.  Having trouble speaking.  Getting lost easily. How is this treated? Treatment depends on the cause of the dementia. It might include taking medicines that help:  To control the dementia.  To slow down the dementia.  To manage symptoms. In some cases, treating  the cause of your dementia can improve symptoms, reverse symptoms, or slow down how quickly it gets worse. Your doctor can help you find support groups and other doctors who can help with your care. Follow these instructions at home: Medicines  Take over-the-counter and prescription medicines only as told by your doctor.  Use a pill organizer to help you manage your medicines.  Avoidtaking medicines for pain or for sleep. Lifestyle  Make healthy choices: ? Be active as told by your doctor. ? Do not use any products that contain nicotine or tobacco, such as cigarettes, e-cigarettes, and chewing tobacco. If you need help quitting, ask your doctor. ? Do not drink alcohol. ? When you get stressed, do something that will help  you to relax. Your doctor can give you tips. ? Spend time with other people.  Make sure you get good sleep. To get good sleep: ? Try not to take naps during the day. ? Keep your bedroom dark and cool. ? In the few hours before you go to bed, try not to do any exercise. ? Do not have foods and drinks with caffeine at night. Eating and drinking  Drink enough fluid to keep your pee (urine) pale yellow.  Eat a healthy diet. General instructions   Talk with your doctor to figure out: ? What you need help with. ? What your safety needs are.  Ask your doctor if it is safe for you to drive.  If told, wear a bracelet that tracks where you are or shows that you are a person with memory loss.  Work with your family to make big decisions.  Keep all follow-up visits as told by your doctor. This is important. Contact a doctor if:  You have any new symptoms.  Your symptoms get worse.  You have problems with swallowing or choking. Get help right away if:  You feel very sad, or feel that you want to harm yourself.  You or your family members are worried for your safety. If you ever feel like you may hurt yourself or others, or have thoughts about taking your own life, get help right away. You can go to your nearest emergency department or call:  Your local emergency services (911 in the [place]  A suicide crisis helpline, such as the [place] at 657-268-4877. This is open 24 hours a day. Summary  Dementia often affects memory and thinking.  Some types of dementia get worse with time and cannot be reversed.  Treatment for this condition depends on the cause.  Talk with your doctor to figure out what you need help with.  Your doctor can help you find support groups and other doctors who can help with your care. This information is not intended to replace advice given to you by your health care provider. Make sure you discuss any questions you have with  your health care provider. Document Revised: 05/25/2018 Document Reviewed: 05/25/2018 Elsevier Patient Education  [place].

## 2019-05-29 ENCOUNTER — Ambulatory Visit: Payer: Medicare HMO | Attending: Internal Medicine

## 2019-05-29 DIAGNOSIS — Z23 Encounter for immunization: Secondary | ICD-10-CM | POA: Insufficient documentation

## 2019-05-29 NOTE — Progress Notes (Signed)
   Covid-19 Vaccination Clinic  Name:  Jizel Cheeks    MRN: [id number] DOB: January 22, 1935  05/29/2019  Ms. Nissen was observed post Covid-19 immunization for 15 minutes without incident. She was provided with Vaccine Information Sheet and instruction to access the V-Safe system.   Ms. Stumpo was instructed to call 911 with any severe reactions post vaccine: Marland Kitchen Difficulty breathing  . Swelling of face and throat  . A fast heartbeat  . A bad rash all over body  . Dizziness and weakness   Immunizations Administered    Name Date Dose VIS Date Route   Pfizer COVID-19 Vaccine 05/29/2019  9:10 AM 0.3 mL 03/04/2019 Intramuscular   Manufacturer: [place]   Lot: [id number]   [place]: [id number]

## 2019-06-21 ENCOUNTER — Ambulatory Visit: Payer: Medicare HMO | Attending: Internal Medicine

## 2019-06-21 DIAGNOSIS — Z23 Encounter for immunization: Secondary | ICD-10-CM

## 2019-06-21 NOTE — Progress Notes (Signed)
   Covid-19 Vaccination Clinic  Name:  Donna Butler    MRN: [id number] DOB: [birth year]  06/21/2019  Donna Butler was observed post Covid-19 immunization for 15 minutes without incident. She was provided with Vaccine Information Sheet and instruction to access the V-Safe system.   Donna Butler was instructed to call 911 with any severe reactions post vaccine: Marland Kitchen Difficulty breathing  . Swelling of face and throat  . A fast heartbeat  . A bad rash all over body  . Dizziness and weakness   Immunizations Administered    Name Date Dose VIS Date Route   Pfizer COVID-19 Vaccine 06/21/2019  4:00 PM 0.3 mL 03/04/2019 Intramuscular   Manufacturer: [place]   Lot: [id number]   [place]: [id number]

## 2019-07-04 ENCOUNTER — Ambulatory Visit: Payer: Medicare HMO | Admitting: Neurology

## 2019-07-13 ENCOUNTER — Emergency Department (HOSPITAL_COMMUNITY): Payer: Medicare HMO

## 2019-07-13 ENCOUNTER — Inpatient Hospital Stay (HOSPITAL_COMMUNITY)
Admission: EM | Admit: 2019-07-13 | Discharge: 2019-07-17 | DRG: 640 | Disposition: A | Discharge status: Home Health | Payer: Medicare HMO | Attending: Internal Medicine | Admitting: Internal Medicine

## 2019-07-13 ENCOUNTER — Encounter (HOSPITAL_COMMUNITY): Payer: Self-pay

## 2019-07-13 ENCOUNTER — Other Ambulatory Visit: Payer: Self-pay

## 2019-07-13 DIAGNOSIS — E7800 Pure hypercholesterolemia, unspecified: Secondary | ICD-10-CM | POA: Diagnosis present

## 2019-07-13 DIAGNOSIS — E785 Hyperlipidemia, unspecified: Secondary | ICD-10-CM | POA: Diagnosis present

## 2019-07-13 DIAGNOSIS — E7220 Disorder of urea cycle metabolism, unspecified: Secondary | ICD-10-CM | POA: Diagnosis present

## 2019-07-13 DIAGNOSIS — E860 Dehydration: Secondary | ICD-10-CM | POA: Diagnosis present

## 2019-07-13 DIAGNOSIS — E876 Hypokalemia: Secondary | ICD-10-CM | POA: Diagnosis not present

## 2019-07-13 DIAGNOSIS — Z90710 Acquired absence of both cervix and uterus: Secondary | ICD-10-CM

## 2019-07-13 DIAGNOSIS — R17 Unspecified jaundice: Secondary | ICD-10-CM | POA: Diagnosis present

## 2019-07-13 DIAGNOSIS — Z6822 Body mass index (BMI) 22.0-22.9, adult: Secondary | ICD-10-CM

## 2019-07-13 DIAGNOSIS — F0280 Dementia in other diseases classified elsewhere without behavioral disturbance: Secondary | ICD-10-CM | POA: Diagnosis present

## 2019-07-13 DIAGNOSIS — N179 Acute kidney failure, unspecified: Secondary | ICD-10-CM | POA: Diagnosis present

## 2019-07-13 DIAGNOSIS — R4182 Altered mental status, unspecified: Secondary | ICD-10-CM

## 2019-07-13 DIAGNOSIS — Z20822 Contact with and (suspected) exposure to covid-19: Secondary | ICD-10-CM | POA: Diagnosis present

## 2019-07-13 DIAGNOSIS — I10 Essential (primary) hypertension: Secondary | ICD-10-CM | POA: Diagnosis present

## 2019-07-13 DIAGNOSIS — F05 Delirium due to known physiological condition: Secondary | ICD-10-CM | POA: Diagnosis present

## 2019-07-13 DIAGNOSIS — F17220 Nicotine dependence, chewing tobacco, uncomplicated: Secondary | ICD-10-CM | POA: Diagnosis present

## 2019-07-13 DIAGNOSIS — E778 Other disorders of glycoprotein metabolism: Secondary | ICD-10-CM | POA: Diagnosis present

## 2019-07-13 DIAGNOSIS — G92 Toxic encephalopathy: Secondary | ICD-10-CM | POA: Diagnosis present

## 2019-07-13 DIAGNOSIS — D509 Iron deficiency anemia, unspecified: Secondary | ICD-10-CM | POA: Diagnosis present

## 2019-07-13 DIAGNOSIS — Z79899 Other long term (current) drug therapy: Secondary | ICD-10-CM

## 2019-07-13 DIAGNOSIS — R627 Adult failure to thrive: Secondary | ICD-10-CM | POA: Diagnosis present

## 2019-07-13 DIAGNOSIS — G252 Other specified forms of tremor: Secondary | ICD-10-CM | POA: Diagnosis present

## 2019-07-13 DIAGNOSIS — Z880 Allergy status to penicillin: Secondary | ICD-10-CM

## 2019-07-13 DIAGNOSIS — E861 Hypovolemia: Secondary | ICD-10-CM | POA: Diagnosis present

## 2019-07-13 DIAGNOSIS — N320 Bladder-neck obstruction: Secondary | ICD-10-CM | POA: Diagnosis present

## 2019-07-13 DIAGNOSIS — E871 Hypo-osmolality and hyponatremia: Principal | ICD-10-CM | POA: Diagnosis present

## 2019-07-13 DIAGNOSIS — Z7982 Long term (current) use of aspirin: Secondary | ICD-10-CM

## 2019-07-13 DIAGNOSIS — Z888 Allergy status to other drugs, medicaments and biological substances status: Secondary | ICD-10-CM

## 2019-07-13 LAB — CBC
HCT: 35.1 % — ABNORMAL LOW (ref 36.0–46.0)
Hemoglobin: 11.8 g/dL — ABNORMAL LOW (ref 12.0–15.0)
MCH: 28.3 pg (ref 26.0–34.0)
MCHC: 33.6 g/dL (ref 30.0–36.0)
MCV: 84.2 fL (ref 80.0–100.0)
Platelets: 231 10*3/uL (ref 150–400)
RBC: 4.17 MIL/uL (ref 3.87–5.11)
RDW: 13.1 % (ref 11.5–15.5)
WBC: 8.8 10*3/uL (ref 4.0–10.5)
nRBC: 0 % (ref 0.0–0.2)

## 2019-07-13 LAB — COMPREHENSIVE METABOLIC PANEL
ALT: 25 U/L (ref 0–44)
AST: 27 U/L (ref 15–41)
Albumin: 3.7 g/dL (ref 3.5–5.0)
Alkaline Phosphatase: 86 U/L (ref 38–126)
Anion gap: 12 (ref 5–15)
BUN: 6 mg/dL — ABNORMAL LOW (ref 8–23)
CO2: 24 mmol/L (ref 22–32)
Calcium: 9.1 mg/dL (ref 8.9–10.3)
Chloride: 83 mmol/L — ABNORMAL LOW (ref 98–111)
Creatinine, Ser: 0.6 mg/dL (ref 0.44–1.00)
GFR calc Af Amer: >60 mL/min (ref >60)
GFR calc non Af Amer: >60 mL/min (ref >60)
Glucose, Bld: 108 mg/dL — ABNORMAL HIGH (ref 70–99)
Potassium: 3.3 mmol/L — ABNORMAL LOW (ref 3.5–5.1)
Sodium: 119 mmol/L — CL (ref 135–145)
Total Bilirubin: 1.1 mg/dL (ref 0.3–1.2)
Total Protein: 6.8 g/dL (ref 6.5–8.1)

## 2019-07-13 LAB — I-STAT CHEM 8, ED
BUN: 6 mg/dL — ABNORMAL LOW (ref 8–23)
Calcium, Ion: 1.08 mmol/L — ABNORMAL LOW (ref 1.15–1.40)
Chloride: 81 mmol/L — ABNORMAL LOW (ref 98–111)
Creatinine, Ser: 0.5 mg/dL (ref 0.44–1.00)
Glucose, Bld: 105 mg/dL — ABNORMAL HIGH (ref 70–99)
HCT: 41 % (ref 36.0–46.0)
Hemoglobin: 13.9 g/dL (ref 12.0–15.0)
Potassium: 3.3 mmol/L — ABNORMAL LOW (ref 3.5–5.1)
Sodium: 117 mmol/L — CL (ref 135–145)
TCO2: 25 mmol/L (ref 22–32)

## 2019-07-13 LAB — DIFFERENTIAL
Abs Immature Granulocytes: 0.02 10*3/uL (ref 0.00–0.07)
Basophils Absolute: 0 10*3/uL (ref 0.0–0.1)
Basophils Relative: 0 %
Eosinophils Absolute: 0 10*3/uL (ref 0.0–0.5)
Eosinophils Relative: 1 %
Immature Granulocytes: 0 %
Lymphocytes Relative: 10 %
Lymphs Abs: 0.9 10*3/uL (ref 0.7–4.0)
Monocytes Absolute: 0.8 10*3/uL (ref 0.1–1.0)
Monocytes Relative: 9 %
Neutro Abs: 6.9 10*3/uL (ref 1.7–7.7)
Neutrophils Relative %: 80 %

## 2019-07-13 LAB — OSMOLALITY: Osmolality: 246 mOsm/kg — CL (ref 275–295)

## 2019-07-13 LAB — MAGNESIUM: Magnesium: 1.5 mg/dL — ABNORMAL LOW (ref 1.7–2.4)

## 2019-07-13 LAB — ETHANOL: Alcohol, Ethyl (B): <10 mg/dL (ref <10)

## 2019-07-13 LAB — URINALYSIS, ROUTINE W REFLEX MICROSCOPIC
Bacteria, UA: NONE SEEN
Bilirubin Urine: NEGATIVE
Glucose, UA: NEGATIVE mg/dL
Ketones, ur: NEGATIVE mg/dL
Leukocytes,Ua: NEGATIVE
Nitrite: NEGATIVE
Protein, ur: NEGATIVE mg/dL
Specific Gravity, Urine: 1.001 — ABNORMAL LOW (ref 1.005–1.030)
pH: 6 (ref 5.0–8.0)

## 2019-07-13 LAB — SODIUM: Sodium: 119 mmol/L — CL (ref 135–145)

## 2019-07-13 LAB — APTT: aPTT: 37 seconds — ABNORMAL HIGH (ref 24–36)

## 2019-07-13 LAB — PROTIME-INR
INR: 1.1 (ref 0.8–1.2)
Prothrombin Time: 13.8 seconds (ref 11.4–15.2)

## 2019-07-13 LAB — CBG MONITORING, ED: Glucose-Capillary: 102 mg/dL — ABNORMAL HIGH (ref 70–99)

## 2019-07-13 MED ORDER — LORAZEPAM 2 MG/ML IJ SOLN
1.0000 mg | Freq: Once | INTRAMUSCULAR | Status: AC
  Administered 2019-07-13: 1 mg via INTRAVENOUS
  Filled 2019-07-13: qty 1

## 2019-07-13 MED ORDER — SODIUM CHLORIDE 0.9 % IV BOLUS
1250.0000 mL | Freq: Once | INTRAVENOUS | Status: AC
  Administered 2019-07-13: 1250 mL via INTRAVENOUS

## 2019-07-13 MED ORDER — POTASSIUM CHLORIDE 10 MEQ/100ML IV SOLN
10.0000 meq | INTRAVENOUS | Status: AC
  Administered 2019-07-13 – 2019-07-14 (×2): 10 meq via INTRAVENOUS
  Filled 2019-07-13 (×2): qty 100

## 2019-07-13 MED ORDER — MAGNESIUM SULFATE 2 GM/50ML IV SOLN
2.0000 g | Freq: Once | INTRAVENOUS | Status: AC
  Administered 2019-07-13: 2 g via INTRAVENOUS
  Filled 2019-07-13: qty 50

## 2019-07-13 MED ORDER — SODIUM CHLORIDE 0.9 % IV BOLUS
500.0000 mL | Freq: Once | INTRAVENOUS | Status: AC
  Administered 2019-07-13: 500 mL via INTRAVENOUS

## 2019-07-13 MED ORDER — SODIUM CHLORIDE 3 % IV SOLN: INTRAVENOUS | Status: DC

## 2019-07-13 MED ORDER — SODIUM CHLORIDE 0.9 % IV SOLN: INTRAVENOUS | Status: DC

## 2019-07-13 NOTE — ED Provider Notes (Signed)
[facility] EMERGENCY DEPARTMENT Provider Note   CSN: [id number] Arrival date & time: 07/13/19  2053  An emergency department physician performed an initial assessment on this suspected stroke patient at 2052.  History Chief Complaint  Patient presents with  . Code Stroke  . Altered Mental Status    Donna Butler is a 85 y.o. female.  HPI Patient presents for evaluation of acute mental status changes.  Patient was last known to be normal around 3:57 PM today, when she talked to her daughter, on the telephone.  Her daughter also saw her this morning as she was leaving for work and had a discussion, regarding the patient taking her morning medications.  The daughter thinks that the patient did take her medicines today.  Apparently the patient chronically does not eat and drink well.  The patient is reported to have had some nasal congestion recently but has not been coughing or having fever.  Her daughter is never seen her like this today.  Her daughter found the patient on the floor, sitting, between 7 and 8 PM tonight.  She was transferred here by EMS.  Patient is unable to give any history.  Level 5 caveat-altered mental status    Past Medical History:  Diagnosis Date  . Dementia ([place])    "had a scan for [Alzheimer's] and they didn't find anything"  . High cholesterol   . Hypertension     Patient Active Problem List   Diagnosis Date Noted  . Chest pain 05/20/2019  . Dyslipidemia 05/20/2019  . Dementia with behavioral disturbance ([place]) 05/20/2019  . Bilateral hydronephrosis 05/20/2019  . Tobacco dependence due to chewing tobacco 05/20/2019  . Colitis presumed infectious 07/08/2017  . Hypokalemia 07/08/2017  . Hypertension 07/08/2017  . Colitis 07/08/2017  . Dilation of biliary tract 07/08/2017  . Splenic lesion 07/08/2017    Past Surgical History:  Procedure Laterality Date  . ABDOMINAL HYSTERECTOMY       OB History   No obstetric history  on file.     Family History  Family history unknown: Yes    Social History   Tobacco Use  . Smoking status: Never Smoker  . Smokeless tobacco: Current User    Types: Chew  Substance Use Topics  . Alcohol use: No  . Drug use: No    Home Medications Prior to Admission medications   Medication Sig Start Date End Date Taking? Authorizing Provider  acetaminophen (TYLENOL) 500 MG tablet Take 500-1,000 mg by mouth every 6 (six) hours as needed for mild pain or headache.   Yes [provider]  aspirin EC 81 MG EC tablet Take 1 tablet (81 mg total) by mouth daily. 05/23/19  Yes Vann, Jessica U, DO  carvedilol (COREG) 6.25 MG tablet Take 6.25 mg by mouth 2 (two) times daily with a meal.   Yes [provider]  cetirizine (ZYRTEC) 10 MG tablet Take 10 mg by mouth daily as needed for allergies or rhinitis.    Yes [provider]  lisinopril (ZESTRIL) 20 MG tablet Take 20 mg by mouth daily.   Yes [provider]  lovastatin (MEVACOR) 40 MG tablet Take 80 mg by mouth at bedtime.    Yes [provider]  metoprolol tartrate (LOPRESSOR) 25 MG tablet Take 0.5 tablets (12.5 mg total) by mouth 2 (two) times daily. Patient not taking: Reported on 07/13/2019 05/22/19   Geradine Girt, DO  pantoprazole (PROTONIX) 40 MG tablet Take 1 tablet (40 mg  total) by mouth daily. Patient not taking: Reported on 07/13/2019 05/23/19   Geradine Girt, DO    Allergies    Pantoprazole and Penicillins  Review of Systems   Review of Systems  Unable to perform ROS: Mental status change    Physical Exam Updated Vital Signs BP (!) 162/87   Pulse 65   Temp 98.4 F (36.9 C) (Oral)   Resp 12   Ht 5' 2"  (1.575 m)   Wt 55.8 kg   SpO2 98%   BMI 22.50 kg/m   Physical Exam Vitals and nursing note reviewed.  Constitutional:      General: She is not in acute distress.    Appearance: She is well-developed. She is ill-appearing. She is not toxic-appearing or diaphoretic.   HENT:     Head: Normocephalic and atraumatic.     Right Ear: External ear normal.     Left Ear: External ear normal.     Nose: No congestion.     Mouth/Throat:     Mouth: Mucous membranes are moist.  Eyes:     Conjunctiva/sclera: Conjunctivae normal.     Pupils: Pupils are equal, round, and reactive to light.  Neck:     Trachea: Phonation normal.  Cardiovascular:     Rate and Rhythm: Normal rate and regular rhythm.     Heart sounds: Normal heart sounds.  Pulmonary:     Effort: Pulmonary effort is normal.     Breath sounds: Normal breath sounds.  Abdominal:     General: There is no distension.     Palpations: Abdomen is soft.     Tenderness: There is no abdominal tenderness.  Musculoskeletal:        General: No swelling or tenderness. Normal range of motion.     Cervical back: Normal range of motion and neck supple.     Comments: She independently moves arms and legs, on command.  Skin:    General: Skin is warm and dry.  Neurological:     Mental Status: She is alert.     Cranial Nerves: No cranial nerve deficit.     Motor: No abnormal muscle tone.     Comments: Patient nonverbal, did not answer questions that I asked.  She was able to actively follow commands.  She has some pill-rolling movements of both hands, which are persistent.  When I asked her to hold her hands above her head she did that and kept them there.  There are some stiffness of the arms, without clear clonus, left greater than right.     ED Results / Procedures / Treatments   Labs (all labs ordered are listed, but only abnormal results are displayed) Labs Reviewed  APTT - Abnormal; Notable for the following components:      Result Value   aPTT 37 (*)    All other components within normal limits  CBC - Abnormal; Notable for the following components:   Hemoglobin 11.8 (*)    HCT 35.1 (*)    All other components within normal limits  COMPREHENSIVE METABOLIC PANEL - Abnormal; Notable for the following  components:   Sodium 119 (*)    Potassium 3.3 (*)    Chloride 83 (*)    Glucose, Bld 108 (*)    BUN 6 (*)    All other components within normal limits  URINALYSIS, ROUTINE W REFLEX MICROSCOPIC - Abnormal; Notable for the following components:   Color, Urine COLORLESS (*)    Specific Gravity, Urine 1.001 (*)  Hgb urine dipstick MODERATE (*)    All other components within normal limits  SODIUM - Abnormal; Notable for the following components:   Sodium 119 (*)    All other components within normal limits  OSMOLALITY - Abnormal; Notable for the following components:   Osmolality 246 (*)    All other components within normal limits  MAGNESIUM - Abnormal; Notable for the following components:   Magnesium 1.5 (*)    All other components within normal limits  I-STAT CHEM 8, ED - Abnormal; Notable for the following components:   Sodium 117 (*)    Potassium 3.3 (*)    Chloride 81 (*)    BUN 6 (*)    Glucose, Bld 105 (*)    Calcium, Ion 1.08 (*)    All other components within normal limits  CBG MONITORING, ED - Abnormal; Notable for the following components:   Glucose-Capillary 102 (*)    All other components within normal limits  RESPIRATORY PANEL BY RT PCR (FLU A&B, COVID)  ETHANOL  PROTIME-INR  DIFFERENTIAL  RAPID URINE DRUG SCREEN, HOSP PERFORMED  OSMOLALITY, URINE  SODIUM, URINE, RANDOM  COMPREHENSIVE METABOLIC PANEL    EKG EKG Interpretation  Date/Time:  Wednesday July 13 2019 21:18:59 EDT Ventricular Rate:  70 PR Interval:    QRS Duration: 96 QT Interval:  433 QTC Calculation: 468 R Axis:   -32 Text Interpretation: Sinus rhythm Abnormal R-wave progression, early transition Left ventricular hypertrophy Anterior Q waves, possibly due to LVH Since last tracing Q wave abnormality is new Confirmed by Daleen Bo [phone]) on 07/13/2019 11:04:19 PM   Radiology CT HEAD CODE STROKE WO CONTRAST  Addendum Date: 07/13/2019   ADDENDUM REPORT: 07/13/2019 21:26 ADDENDUM:  These results were communicated to Dr. Cheral Marker at 9:13 pm on 07/13/2019 by text page via the [facility] messaging system. Electronically Signed   By: Logan Bores M.D.   On: 07/13/2019 21:26   Result Date: 07/13/2019 CLINICAL DATA:  Code stroke.  Altered mental status.  Aphasia. EXAM: CT HEAD WITHOUT CONTRAST TECHNIQUE: Contiguous axial images were obtained from the base of the skull through the vertex without intravenous contrast. COMPARISON:  05/28/2018 FINDINGS: Brain: There is no evidence of acute infarct, intracranial hemorrhage, mass, midline shift, or extra-axial fluid collection. The ventricles and sulci are within normal limits for age. Vascular: No hyperdense vessel. Skull: Hyperostosis and dural ossification as previously seen. No acute fracture or destructive osseous lesion. Sinuses/Orbits: The visualized paranasal sinuses and mastoid air cells are clear. Bilateral cataract extraction. Other: None. ASPECTS [facility] Stroke Program Early CT Score) - Ganglionic level infarction (caudate, lentiform nuclei, internal capsule, insula, M1-M3 cortex): 7 - Supraganglionic infarction (M4-M6 cortex): 3 Total score (0-10 with 10 being normal): 10 IMPRESSION: 1. No evidence of acute intracranial abnormality. 2. ASPECTS is 10. Electronically Signed: By: Logan Bores M.D. On: 07/13/2019 21:13    Procedures .Critical Care Performed by: Daleen Bo, MD Authorized by: Daleen Bo, MD   Critical care provider statement:    Critical care time (minutes):  35   Critical care start time:  07/13/2019 9:30 PM   Critical care end time:  07/13/2019 11:07 PM   Critical care time was exclusive of:  Separately billable procedures and treating other patients   Critical care was necessary to treat or prevent imminent or life-threatening deterioration of the following conditions:  Metabolic crisis   Critical care was time spent personally by me on the following activities:  Blood draw for specimens, development of treatment  plan with patient or surrogate, discussions with consultants, evaluation of patient's response to treatment, examination of patient, obtaining history from patient or surrogate, ordering and performing treatments and interventions, ordering and review of laboratory studies, pulse oximetry, re-evaluation of patient's condition, review of old charts and ordering and review of radiographic studies   (including critical care time)  Medications Ordered in ED Medications  magnesium sulfate IVPB 2 g 50 mL (2 g Intravenous New Bag/Given 07/13/19 2318)  potassium chloride 10 mEq in 100 mL IVPB (10 mEq Intravenous New Bag/Given 07/13/19 2318)  LORazepam (ATIVAN) injection 1 mg (1 mg Intravenous Given 07/13/19 2138)  sodium chloride 0.9 % bolus 500 mL (0 mLs Intravenous Stopped 07/13/19 2317)  sodium chloride 0.9 % bolus 1,250 mL (1,250 mLs Intravenous New Bag/Given 07/13/19 2317)    ED Course  I have reviewed the triage vital signs and the nursing notes.  Pertinent labs & imaging results that were available during my care of the patient were reviewed by me and considered in my medical decision making (see chart for details).  Clinical Course as of Jul 12 2357  Wed Jul 13, 2019  2140 Discussed with neuro hospitalist, Dr. Cheral Marker.  He plans on evaluating patient for possible seizures, EEG has been ordered.  He states no indication for urgent MRI imaging.   [EW]  2156 Abnormal, sodium low, potassium low, chloride low, BUN low, glucose high, calcium low  I-stat chem 8, ED(!!) [EW]  2156 Normal except albumin low  CBC(!) [EW]  2156 Normal except potassium low, chloride low, glucose low, BUN normal  Comprehensive metabolic panel(!!) [EW]  9675 Normal  Ethanol [EW]  2156 Normal  Protime-INR [EW]  2157 Radiologist, no acute abnormality.  CT HEAD CODE STROKE WO CONTRAST [EW]  2256 Case discussed with pharmacist, based on this discussion we will change from infusion of 3% saline to normal saline IV bolus,  x2 L.  This will hopefully raise the sodium by 5 mmol/L   [EW]  2259 Platelets: 231 [EW]  2259 At this time patient has her eyes open, and is showing less pill-rolling movements of the hands, is verbally responsive and states that she is comfortable.   [EW]    Clinical Course User Index [EW] Daleen Bo, MD   MDM Rules/Calculators/A&P                       Patient Vitals for the past 24 hrs:  BP Temp Temp src Pulse Resp SpO2 Height Weight  07/13/19 2215 (!) 162/87 -- -- 65 12 98 % -- --  07/13/19 2200 (!) 171/95 -- -- 72 18 97 % -- --  07/13/19 2145 (!) 197/117 -- -- 70 15 96 % -- --  07/13/19 2130 (!) 173/90 -- -- 69 14 98 % -- --  07/13/19 2129 -- -- -- -- -- -- 5' 2"  (1.575 m) 55.8 kg  07/13/19 2128 -- 98.4 F (36.9 C) Oral -- -- -- -- --     Medical Decision Making:  This patient is presenting for evaluation of altered mental status, with altered muscle activity, possibly seizure related, which does require a range of treatment options, and his a complaint that involves a high risk of morbidity and mortality. The differential diagnoses include CVA, seizures, metabolic injury. I decided  to review old records, and in summary patient with waxing and waning mental status at home, previously, consistent with dementia.  She has not been ill recently.  No traumatic injuries  known.  No visible traumatic injury.. I obtained additional historical information from daughter, at bedside. Clinical Laboratory Tests Ordered, included chemistry panel, CBC, alcohol level, magnesium level.  Results reviewed and indicate severe hyponatremia with hypochloremia.  Alcohol level normal. Radiologic Tests Ordered, included CT head. I independently Visualized: CT images, which show no tumor, swelling or bleeding; Cardiac Monitor Tracing which shows normal sinus rhythm  Critical Interventions-clinical evaluation, neurology consultation, observation  After These Interventions, the Patient was  reevaluated and was found more alert  CRITICAL CARE- yes Performed by: Daleen Bo   Nursing Notes Reviewed/ Care Coordinated Applicable Imaging Reviewed Interpretation of Laboratory Data incorporated into ED treatment  11:08 PM-Consult complete with hospitalist. Patient case explained and discussed.  He agrees to admit patient for further evaluation and treatment. Call ended at [place]  Final Clinical Impression(s) / ED Diagnoses Final diagnoses:  Hyponatremia  Altered mental status, unspecified altered mental status type  Hypokalemia    Rx / DC Orders ED Discharge Orders    None       Daleen Bo, MD 07/13/19 2359

## 2019-07-13 NOTE — Consult Note (Addendum)
NEURO HOSPITALIST CONSULT NOTE   Requesting physician: Dr. Maryan Rued  Reason for Consult: Acute onset of confusion   History obtained from:  EMS, Daughter and Chart    HPI:                                                                                                                                          Donna Butler is an 85 y.o. female presenting from home via EMS after family noted her to be in an awake, verbally unresponsive state. Daughter states that she last saw the patient as normal at 3:30 PM before leaving the house. On coming back home this evening, she saw the patient sitting on the floor by her couch looking altered and not answering questions. EMS was called. A Code Stroke was called en route for aphasia.   On arrival to the ED, the patient was minimally verbal, able to state her first name, with arms held up flexed at the elbows with ongoing bilateral pill-rolling tremors bilaterally. She was incontinent of urine.   STAT CT head revealed no acute intracranial abnormality.  Daughter states that the patient is fully able to manage her own affairs at home, but has been showing signs of memory decline for the past 2 years. She was seen 2 years ago for this and daughter was told that it was not felt that the patient had a definite dementia at that time. She is scheduled to be re-evaluated by her Neurologist as her cognitive decline has continued. Daughter states that patient does not have visual hallucinations, does not move abnormally during sleep, and does not have a shuffling gait or other Parkinsonian symptoms. She has never had a seizure or a stroke. The pill rolling movements she is currently exhibiting have never occurred in the past. She has not had any prior episodes of AMS similar to the one today. She has not been complaining of headache and has not had any symptoms of infection at home, including no cough or foul-smelling urine.   Past Medical  History:  Diagnosis Date  . Dementia ([place])    "had a scan for [Alzheimer's] and they didn't find anything"  . High cholesterol   . Hypertension     Past Surgical History:  Procedure Laterality Date  . ABDOMINAL HYSTERECTOMY      Family History  Family history unknown: Yes               Social History:  reports that she has never smoked. Her smokeless tobacco use includes chew. She reports that she does not drink alcohol or use drugs.  Allergies  Allergen Reactions  . Penicillins Other (See Comments)    Reaction not recalled Has patient had a PCN reaction causing immediate rash, facial/tongue/throat swelling, SOB  or lightheadedness with hypotension: Unk Has patient had a PCN reaction causing severe rash involving mucus membranes or skin necrosis: No Has patient had a PCN reaction that required hospitalization: No Has patient had a PCN reaction occurring within the last 10 years: No If all of the above answers are "NO", then may proceed with Cephalosporin use.     HOME MEDICATIONS:                                                                                                                      No current facility-administered medications on file prior to encounter.   Current Outpatient Medications on File Prior to Encounter  Medication Sig Dispense Refill  . acetaminophen (TYLENOL) 500 MG tablet Take 500-1,000 mg by mouth every 6 (six) hours as needed for mild pain or headache.    Marland Kitchen aspirin EC 81 MG EC tablet Take 1 tablet (81 mg total) by mouth daily.    Marland Kitchen lisinopril (ZESTRIL) 20 MG tablet Take 20 mg by mouth daily.    Marland Kitchen lovastatin (MEVACOR) 40 MG tablet Take 80 mg by mouth at bedtime.     . metoprolol tartrate (LOPRESSOR) 25 MG tablet Take 0.5 tablets (12.5 mg total) by mouth 2 (two) times daily. 60 tablet 0  . pantoprazole (PROTONIX) 40 MG tablet Take 1 tablet (40 mg total) by mouth daily. 30 tablet 0     ROS:                                                                                                                                        As per HPI. Cannot obtain further information from the patient due to AMS.    There were no vitals taken for this visit.   General Examination:                                                                                                       Physical Exam  HEENT-  [place]/AT  Lungs- Respirations unlabored Extremities- Warm and well-perfused  Neurological Examination Mental Status: Awake with eyes open. Following only simple commands that are repeated several times to her. She will state her first and last name as well as the city. She does not answer any other orientation questions, appearing confused. She does not speak except with one-word answers to questions. No gross dysarthria. Does not name simple items shown to her.  Cranial Nerves: II: Blinks to threat in bilateral temporal visual fields, but does not respond to questions for formal visual field testing. PERRL.   III,IV, VI: Briefly tracks examiner's face to left and right. No ptosis or nystagmus noted.  V,VII: Face is symmetric. Reacts to cold metal bilaterally.  VIII: hearing intact to some questions.  IX,X: Hypophonic with minimal verbal output.  XI: Head is midline XII: Does not protrude tongue to command Motor: Will maintain elevation of BUE for > 10 seconds without drift after they are passively raised by examiner. Will grip examiner's hand bilaterally with 4/5 strength. Will resist examiner with 4/5 biceps and triceps strength. Will not elevate BLE to command. Withdraws with 4/5 strength bilaterally to plantar stimulation.  Sensory: Did not react to pinch to BUE but did nod head when asked if she could feel a cool stimulus. Reacts briskly to bilateral noxious plantar stimulation.  Deep Tendon Reflexes: 2+ bilateral upper extremities and patellae.  Plantars: Right: downgoing   Left: downgoing Cerebellar: Unable to fully complete FNF to command,  but no gross ataxia with her limited attempts to do so.  Gait: Deferred   Lab Results: Basic Metabolic Panel: No results for input(s): NA, K, CL, CO2, GLUCOSE, BUN, CREATININE, CALCIUM, MG, PHOS in the last 168 hours.  CBC: No results for input(s): WBC, NEUTROABS, HGB, HCT, MCV, PLT in the last 168 hours.  Cardiac Enzymes: No results for input(s): CKTOTAL, CKMB, CKMBINDEX, TROPONINI in the last 168 hours.  Lipid Panel: No results for input(s): CHOL, TRIG, HDL, CHOLHDL, VLDL, LDLCALC in the last 168 hours.  Imaging: No results found.   Assessment: 85 year old female presenting with awake unresponsive state and bilateral hand tremors.  1. No jerking or twitching seen on exam. However, the hand tremors do appear somewhat stereotyped and may represent complex motor behavior due to frontal lobe seizure.  2. CT head shows no acute abnormality.  3. Overall presentation is most consistent with an acute encephalopathy due to metabolic or toxic etiology, versus seizure. The exam findings are not consistent with a focal lesional aphasia. Code stroke has been cancelled.  4. Na is 117. This is felt to be the most likely underlying etiology for her acute presentation.   Recommendations: 1. Ativan 1 mg IV x 1.  2. STAT EEG.  3. Gradual correction of Na level. Do not correct by more than 0.5 meq/L per hour to decrease the risk of osmotic demyelination syndrome.  4. Obtain TSH, B12, ESR and ammonia levels.  5. Mg level is pending.   Addendum: -- EEG reveals slowing with triphasics. No electrographic seizure activity is seen.    Electronically signed: Dr. Kerney Elbe 07/13/2019, 9:00 PM

## 2019-07-13 NOTE — Progress Notes (Signed)
MEDICATION RELATED CONSULT NOTE - INITIAL   Pharmacy Consult for Sodium Chloride Indication: Hyponatremia   Allergies  Allergen Reactions  . Pantoprazole Other (See Comments)    Caused lethargy and an overall feeling of "just not feeling well"  . Penicillins Other (See Comments)    Reaction not recalled, but allergic Has patient had a PCN reaction causing immediate rash, facial/tongue/throat swelling, SOB or lightheadedness with hypotension: Unk Has patient had a PCN reaction causing severe rash involving mucus membranes or skin necrosis: No Has patient had a PCN reaction that required hospitalization: No Has patient had a PCN reaction occurring within the last 10 years: No If all of the above answers are "NO", then may proceed with Cephalosporin use.     Patient Measurements: Height: 5' 2"  (157.5 cm) Weight: 55.8 kg (123 lb) IBW/kg (Calculated) : 50.1 Adjusted Body Weight:   Vital Signs: Temp: 98.4 F (36.9 C) (04/21 2128) Temp Source: Oral (04/21 2128) BP: 162/87 (04/21 2215) Pulse Rate: 65 (04/21 2215) Intake/Output from previous day: No intake/output data recorded. Intake/Output from this shift: No intake/output data recorded.  Labs: Recent Labs    07/13/19 2059 07/13/19 2101  WBC 8.8  --   HGB 11.8* 13.9  HCT 35.1* 41.0  PLT 231  --   APTT 37*  --   CREATININE 0.60 0.50  ALBUMIN 3.7  --   PROT 6.8  --   AST 27  --   ALT 25  --   ALKPHOS 86  --   BILITOT 1.1  --    Estimated Creatinine Clearance: 42.9 mL/min (by C-G formula based on SCr of 0.5 mg/dL).   Microbiology: No results found for this or any previous visit (from the past 720 hour(s)).  Medical History: Past Medical History:  Diagnosis Date  . Dementia ([place])    "had a scan for [Alzheimer's] and they didn't find anything"  . High cholesterol   . Hypertension     Medications:  Scheduled:    Assessment: Patient is a 78 yof that presents with AMS and hyponatremia. The patient is not  currently having seizures at this time.   Goal of Therapy:  Increase Sodium level by ~ 5 meq    Plan:  - Spoke with Dr. Eulis Foster and patient did not appear to need ICU level of care - Decision made to use NS in place of 3% saline at this time  - NS bolus of 531m was given already  - Calculated 1750 ml of NS would increase the patients NA level by ~ 5.2. - Will order the remaining 12523mto be administered over 2 hours and check Na level 1 hour after infusion.    JaDuanne LimerickharmD. BCPS 07/13/2019,11:04 PM

## 2019-07-13 NOTE — ED Triage Notes (Signed)
Pt arrives via [facility] c/o acute AMS and dysphagia. LKW 0330. Family noted symptoms approx. 40 mins PTA. A&Ox1, GCS 15. VS: BP 150/100, HR 70, RR 20, SPO2 99% RA.

## 2019-07-14 ENCOUNTER — Encounter (HOSPITAL_COMMUNITY): Payer: Self-pay | Admitting: Internal Medicine

## 2019-07-14 DIAGNOSIS — F0280 Dementia in other diseases classified elsewhere without behavioral disturbance: Secondary | ICD-10-CM | POA: Diagnosis present

## 2019-07-14 DIAGNOSIS — N320 Bladder-neck obstruction: Secondary | ICD-10-CM | POA: Diagnosis present

## 2019-07-14 DIAGNOSIS — N179 Acute kidney failure, unspecified: Secondary | ICD-10-CM | POA: Diagnosis present

## 2019-07-14 DIAGNOSIS — E785 Hyperlipidemia, unspecified: Secondary | ICD-10-CM | POA: Diagnosis present

## 2019-07-14 DIAGNOSIS — F05 Delirium due to known physiological condition: Secondary | ICD-10-CM | POA: Diagnosis present

## 2019-07-14 DIAGNOSIS — E7220 Disorder of urea cycle metabolism, unspecified: Secondary | ICD-10-CM | POA: Diagnosis present

## 2019-07-14 DIAGNOSIS — E7800 Pure hypercholesterolemia, unspecified: Secondary | ICD-10-CM | POA: Diagnosis present

## 2019-07-14 DIAGNOSIS — Z6822 Body mass index (BMI) 22.0-22.9, adult: Secondary | ICD-10-CM | POA: Diagnosis not present

## 2019-07-14 DIAGNOSIS — E876 Hypokalemia: Secondary | ICD-10-CM | POA: Diagnosis present

## 2019-07-14 DIAGNOSIS — D509 Iron deficiency anemia, unspecified: Secondary | ICD-10-CM | POA: Diagnosis present

## 2019-07-14 DIAGNOSIS — R4182 Altered mental status, unspecified: Secondary | ICD-10-CM | POA: Diagnosis not present

## 2019-07-14 DIAGNOSIS — G92 Toxic encephalopathy: Secondary | ICD-10-CM | POA: Diagnosis present

## 2019-07-14 DIAGNOSIS — Z79899 Other long term (current) drug therapy: Secondary | ICD-10-CM | POA: Diagnosis not present

## 2019-07-14 DIAGNOSIS — Z7982 Long term (current) use of aspirin: Secondary | ICD-10-CM | POA: Diagnosis not present

## 2019-07-14 DIAGNOSIS — R17 Unspecified jaundice: Secondary | ICD-10-CM | POA: Diagnosis present

## 2019-07-14 DIAGNOSIS — Z90710 Acquired absence of both cervix and uterus: Secondary | ICD-10-CM | POA: Diagnosis not present

## 2019-07-14 DIAGNOSIS — Z880 Allergy status to penicillin: Secondary | ICD-10-CM | POA: Diagnosis not present

## 2019-07-14 DIAGNOSIS — E860 Dehydration: Secondary | ICD-10-CM | POA: Diagnosis present

## 2019-07-14 DIAGNOSIS — I10 Essential (primary) hypertension: Secondary | ICD-10-CM | POA: Diagnosis present

## 2019-07-14 DIAGNOSIS — E861 Hypovolemia: Secondary | ICD-10-CM | POA: Diagnosis present

## 2019-07-14 DIAGNOSIS — Z20822 Contact with and (suspected) exposure to covid-19: Secondary | ICD-10-CM | POA: Diagnosis present

## 2019-07-14 DIAGNOSIS — R627 Adult failure to thrive: Secondary | ICD-10-CM | POA: Diagnosis present

## 2019-07-14 DIAGNOSIS — E871 Hypo-osmolality and hyponatremia: Secondary | ICD-10-CM | POA: Diagnosis present

## 2019-07-14 DIAGNOSIS — E778 Other disorders of glycoprotein metabolism: Secondary | ICD-10-CM | POA: Diagnosis present

## 2019-07-14 DIAGNOSIS — Z888 Allergy status to other drugs, medicaments and biological substances status: Secondary | ICD-10-CM | POA: Diagnosis not present

## 2019-07-14 HISTORY — DX: Hypo-osmolality and hyponatremia: E87.1

## 2019-07-14 LAB — CBC WITH DIFFERENTIAL/PLATELET
Abs Immature Granulocytes: 0.03 10*3/uL (ref 0.00–0.07)
Basophils Absolute: 0 10*3/uL (ref 0.0–0.1)
Basophils Relative: 0 %
Eosinophils Absolute: 0 10*3/uL (ref 0.0–0.5)
Eosinophils Relative: 0 %
HCT: 28.6 % — ABNORMAL LOW (ref 36.0–46.0)
Hemoglobin: 9.4 g/dL — ABNORMAL LOW (ref 12.0–15.0)
Immature Granulocytes: 0 %
Lymphocytes Relative: 8 %
Lymphs Abs: 0.7 10*3/uL (ref 0.7–4.0)
MCH: 27.9 pg (ref 26.0–34.0)
MCHC: 32.9 g/dL (ref 30.0–36.0)
MCV: 84.9 fL (ref 80.0–100.0)
Monocytes Absolute: 0.8 10*3/uL (ref 0.1–1.0)
Monocytes Relative: 10 %
Neutro Abs: 6.8 10*3/uL (ref 1.7–7.7)
Neutrophils Relative %: 82 %
Platelets: 172 10*3/uL (ref 150–400)
RBC: 3.37 MIL/uL — ABNORMAL LOW (ref 3.87–5.11)
RDW: 13.2 % (ref 11.5–15.5)
WBC: 8.3 10*3/uL (ref 4.0–10.5)
nRBC: 0 % (ref 0.0–0.2)

## 2019-07-14 LAB — VITAMIN B12: Vitamin B-12: 1220 pg/mL — ABNORMAL HIGH (ref 180–914)

## 2019-07-14 LAB — BASIC METABOLIC PANEL
Anion gap: 13 (ref 5–15)
Anion gap: 9 (ref 5–15)
Anion gap: 10 (ref 5–15)
Anion gap: 13 (ref 5–15)
BUN: 6 mg/dL — ABNORMAL LOW (ref 8–23)
BUN: 8 mg/dL (ref 8–23)
BUN: 10 mg/dL (ref 8–23)
BUN: 10 mg/dL (ref 8–23)
CO2: 21 mmol/L — ABNORMAL LOW (ref 22–32)
CO2: 20 mmol/L — ABNORMAL LOW (ref 22–32)
CO2: 22 mmol/L (ref 22–32)
CO2: 19 mmol/L — ABNORMAL LOW (ref 22–32)
Calcium: 9.3 mg/dL (ref 8.9–10.3)
Calcium: 7.7 mg/dL — ABNORMAL LOW (ref 8.9–10.3)
Calcium: 8.8 mg/dL — ABNORMAL LOW (ref 8.9–10.3)
Calcium: 8.5 mg/dL — ABNORMAL LOW (ref 8.9–10.3)
Chloride: 90 mmol/L — ABNORMAL LOW (ref 98–111)
Chloride: 98 mmol/L (ref 98–111)
Chloride: 95 mmol/L — ABNORMAL LOW (ref 98–111)
Chloride: 93 mmol/L — ABNORMAL LOW (ref 98–111)
Creatinine, Ser: 0.69 mg/dL (ref 0.44–1.00)
Creatinine, Ser: 0.93 mg/dL (ref 0.44–1.00)
Creatinine, Ser: 0.94 mg/dL (ref 0.44–1.00)
Creatinine, Ser: 1.24 mg/dL — ABNORMAL HIGH (ref 0.44–1.00)
GFR calc Af Amer: >60 mL/min (ref >60)
GFR calc Af Amer: >60 mL/min (ref >60)
GFR calc Af Amer: >60 mL/min (ref >60)
GFR calc Af Amer: 47 mL/min — ABNORMAL LOW (ref >60)
GFR calc non Af Amer: >60 mL/min (ref >60)
GFR calc non Af Amer: 57 mL/min — ABNORMAL LOW (ref >60)
GFR calc non Af Amer: 56 mL/min — ABNORMAL LOW (ref >60)
GFR calc non Af Amer: 40 mL/min — ABNORMAL LOW (ref >60)
Glucose, Bld: 86 mg/dL (ref 70–99)
Glucose, Bld: 125 mg/dL — ABNORMAL HIGH (ref 70–99)
Glucose, Bld: 109 mg/dL — ABNORMAL HIGH (ref 70–99)
Glucose, Bld: 99 mg/dL (ref 70–99)
Potassium: 4.2 mmol/L (ref 3.5–5.1)
Potassium: 3.2 mmol/L — ABNORMAL LOW (ref 3.5–5.1)
Potassium: 3.5 mmol/L (ref 3.5–5.1)
Potassium: 3.5 mmol/L (ref 3.5–5.1)
Sodium: 124 mmol/L — ABNORMAL LOW (ref 135–145)
Sodium: 127 mmol/L — ABNORMAL LOW (ref 135–145)
Sodium: 127 mmol/L — ABNORMAL LOW (ref 135–145)
Sodium: 125 mmol/L — ABNORMAL LOW (ref 135–145)

## 2019-07-14 LAB — RAPID URINE DRUG SCREEN, HOSP PERFORMED
Amphetamines: NOT DETECTED
Barbiturates: NOT DETECTED
Benzodiazepines: NOT DETECTED
Cocaine: NOT DETECTED
Opiates: NOT DETECTED
Tetrahydrocannabinol: NOT DETECTED

## 2019-07-14 LAB — OSMOLALITY, URINE: Osmolality, Ur: 75 mOsm/kg — ABNORMAL LOW (ref 300–900)

## 2019-07-14 LAB — RESPIRATORY PANEL BY RT PCR (FLU A&B, COVID)
Influenza A by PCR: NEGATIVE
Influenza B by PCR: NEGATIVE
SARS Coronavirus 2 by RT PCR: NEGATIVE

## 2019-07-14 LAB — SODIUM, URINE, RANDOM: Sodium, Ur: 23 mmol/L

## 2019-07-14 LAB — AMMONIA: Ammonia: 40 umol/L — ABNORMAL HIGH (ref 9–35)

## 2019-07-14 LAB — HEPATIC FUNCTION PANEL
ALT: 20 U/L (ref 0–44)
AST: 38 U/L (ref 15–41)
Albumin: 3.7 g/dL (ref 3.5–5.0)
Alkaline Phosphatase: 94 U/L (ref 38–126)
Bilirubin, Direct: 0.4 mg/dL — ABNORMAL HIGH (ref 0.0–0.2)
Indirect Bilirubin: 1.3 mg/dL — ABNORMAL HIGH (ref 0.3–0.9)
Total Bilirubin: 1.7 mg/dL — ABNORMAL HIGH (ref 0.3–1.2)
Total Protein: 6.3 g/dL — ABNORMAL LOW (ref 6.5–8.1)

## 2019-07-14 LAB — MAGNESIUM: Magnesium: 1.9 mg/dL (ref 1.7–2.4)

## 2019-07-14 LAB — SEDIMENTATION RATE: Sed Rate: 0 mm/hr (ref 0–22)

## 2019-07-14 LAB — TSH: TSH: 2.062 u[IU]/mL (ref 0.350–4.500)

## 2019-07-14 MED ORDER — SODIUM CHLORIDE 0.9 % IV SOLN
INTRAVENOUS | Status: DC
  Administered 2019-07-14: 85 mL/h via INTRAVENOUS

## 2019-07-14 MED ORDER — ENOXAPARIN SODIUM 40 MG/0.4ML ~~LOC~~ SOLN
40.0000 mg | SUBCUTANEOUS | Status: DC
  Administered 2019-07-14 – 2019-07-15 (×2): 40 mg via SUBCUTANEOUS
  Filled 2019-07-14 (×2): qty 0.4

## 2019-07-14 MED ORDER — QUETIAPINE FUMARATE 25 MG PO TABS
25.0000 mg | ORAL_TABLET | Freq: Every day | ORAL | Status: DC
  Administered 2019-07-14 – 2019-07-16 (×3): 25 mg via ORAL
  Filled 2019-07-14 (×3): qty 1

## 2019-07-14 MED ORDER — LABETALOL HCL 5 MG/ML IV SOLN
10.0000 mg | INTRAVENOUS | Status: DC | PRN
  Administered 2019-07-14 – 2019-07-15 (×2): 10 mg via INTRAVENOUS
  Filled 2019-07-14 (×2): qty 4

## 2019-07-14 MED ORDER — HYDRALAZINE HCL 25 MG PO TABS: 25.0000 mg | ORAL_TABLET | ORAL | Status: DC | PRN

## 2019-07-14 MED ORDER — ACETAMINOPHEN 500 MG PO TABS: 500.0000 mg | ORAL_TABLET | Freq: Four times a day (QID) | ORAL | Status: DC | PRN

## 2019-07-14 MED ORDER — LISINOPRIL 20 MG PO TABS
20.0000 mg | ORAL_TABLET | Freq: Every day | ORAL | Status: DC
  Administered 2019-07-14: 20 mg via ORAL
  Filled 2019-07-14 (×2): qty 1

## 2019-07-14 MED ORDER — ASPIRIN EC 81 MG PO TBEC
81.0000 mg | DELAYED_RELEASE_TABLET | Freq: Every day | ORAL | Status: DC
  Administered 2019-07-14 – 2019-07-17 (×4): 81 mg via ORAL
  Filled 2019-07-14 (×4): qty 1

## 2019-07-14 MED ORDER — CARVEDILOL 6.25 MG PO TABS
6.2500 mg | ORAL_TABLET | Freq: Two times a day (BID) | ORAL | Status: DC
  Administered 2019-07-14 (×2): 6.25 mg via ORAL
  Filled 2019-07-14 (×3): qty 1

## 2019-07-14 NOTE — Plan of Care (Signed)
  Problem: Clinical Measurements: Goal: Ability to maintain clinical measurements within normal limits will improve Outcome: Progressing   

## 2019-07-14 NOTE — Progress Notes (Signed)
PROGRESS NOTE    Donna Butler  ZOX:[id number] DOB: 09/24/1934 DOA: 07/13/2019 PCP: Jilda Panda, MD    Brief Narrative:  85yo with hx HTN, HLD, dementia presenting with decreased responsiveness and B hand tremors. Pt found to be hyponatremic  Assessment & Plan:   Active Problems:   Hyponatremia  Acute toxic metabolic encephalopathy - given less responsive state at home along with shaking there was concern in the ER for stroke vs seizure. EEG performed which was negative for seizure activity - likely multifactorial in setting of hyponatremia in the setting of underlying dementia -Appears to converse appropriately this AM -Cont to treat below hyponatremia - TSH 2.062, B12 1220, ESR 0, NH3 40 -will consult PT/OT  Hyponatremia -Appears hypovolemic and dehydrated on exam -Pt is continued on NS at 85cc/hr -Cont to follow serial sodium trends  Dementia -Family notes pt has hx of sundowning and requiring PRN haldol -Will start QHS seroquel while in hospital -EKG reviewed, QTc of 468  DVT prophylaxis: Lovenox subq Code Status: Full Family Communication: Pt in room, family not at bedside  Status is: Inpatient  Remains inpatient appropriate because:IV treatments appropriate due to intensity of illness or inability to take PO and Inpatient level of care appropriate due to severity of illness   Dispo: The patient is from: Home              Anticipated d/c is to: Home, pending eval by PT/OT              Anticipated d/c date is: 1 day              Patient currently is not medically stable to d/c.        Consultants:   Neurology  Procedures:   EEG  Antimicrobials: Anti-infectives (From admission, onward)   None       Subjective: In good spirits. Does report feeling thirsty  Objective: Vitals:   07/14/19 1008 07/14/19 1100 07/14/19 1200 07/14/19 1454  BP: (!) 189/85 (!) 153/77 139/86 (!) 170/84  Pulse: 63 69 (!) 106 96  Resp: 16 14 (!) 22 16  Temp:     98.3 F (36.8 C)  TempSrc:    Oral  SpO2: 100% 100% 100% 99%  Weight:      Height:        Intake/Output Summary (Last 24 hours) at 07/14/2019 1906 Last data filed at 07/14/2019 1825 Gross per 24 hour  Intake 3297.23 ml  Output 550 ml  Net 2747.23 ml   Filed Weights   07/13/19 2129  Weight: 55.8 kg    Examination:  General exam: Appears calm and comfortable, dry mucus membranes Respiratory system: Clear to auscultation. Respiratory effort normal. Cardiovascular system: S1 & S2 heard, Regular Gastrointestinal system: Abdomen is nondistended, soft and nontender. No organomegaly or masses felt. Normal bowel sounds heard. Central nervous system: Alert  No focal neurological deficits. Extremities: Symmetric 5 x 5 power. Skin: No rashes, lesions  Psychiatry: Judgement and insight appear normal. Mood & affect appropriate.   Data Reviewed: I have personally reviewed following labs and imaging studies  CBC: Recent Labs  Lab 07/13/19 2059 07/13/19 2101 07/14/19 0241  WBC 8.8  --  8.3  NEUTROABS 6.9  --  6.8  HGB 11.8* 13.9 9.4*  HCT 35.1* 41.0 28.6*  MCV 84.2  --  84.9  PLT 231  --  811   Basic Metabolic Panel: Recent Labs  Lab 07/13/19 2059 07/13/19 2101 07/13/19 2142 07/14/19 0824 07/14/19 1304  NA 119* 117* 119* 124* 127*  K 3.3* 3.3*  --  4.2 3.2*  CL 83* 81*  --  90* 98  CO2 24  --   --  21* 20*  GLUCOSE 108* 105*  --  86 125*  BUN 6* 6*  --  6* 8  CREATININE 0.60 0.50  --  0.69 0.93  CALCIUM 9.1  --   --  9.3 7.7*  MG  --   --  1.5* 1.9  --    GFR: Estimated Creatinine Clearance: 36.9 mL/min (by C-G formula based on SCr of 0.93 mg/dL). Liver Function Tests: Recent Labs  Lab 07/13/19 2059 07/14/19 0824  AST 27 38  ALT 25 20  ALKPHOS 86 94  BILITOT 1.1 1.7*  PROT 6.8 6.3*  ALBUMIN 3.7 3.7   No results for input(s): LIPASE, AMYLASE in the last 168 hours. Recent Labs  Lab 07/14/19 0824  AMMONIA 40*   Coagulation Profile: Recent Labs  Lab  07/13/19 2059  INR 1.1   Cardiac Enzymes: No results for input(s): CKTOTAL, CKMB, CKMBINDEX, TROPONINI in the last 168 hours. BNP (last 3 results) No results for input(s): PROBNP in the last 8760 hours. HbA1C: No results for input(s): HGBA1C in the last 72 hours. CBG: Recent Labs  Lab 07/13/19 2057  GLUCAP 102*   Lipid Profile: No results for input(s): CHOL, HDL, LDLCALC, TRIG, CHOLHDL, LDLDIRECT in the last 72 hours. Thyroid Function Tests: Recent Labs    07/14/19 0824  TSH 2.062   Anemia Panel: Recent Labs    07/14/19 0824  VITAMINB12 1,220*   Sepsis Labs: No results for input(s): PROCALCITON, LATICACIDVEN in the last 168 hours.  Recent Results (from the past 240 hour(s))  Respiratory Panel by RT PCR (Flu A&B, Covid) - Nasopharyngeal Swab     Status: None   Collection Time: 07/13/19 11:25 PM   Specimen: Nasopharyngeal Swab  Result Value Ref Range Status   SARS Coronavirus 2 by RT PCR NEGATIVE NEGATIVE Final    Comment: (NOTE) SARS-CoV-2 target nucleic acids are NOT DETECTED. The SARS-CoV-2 RNA is generally detectable in upper respiratoy specimens during the acute phase of infection. The lowest concentration of SARS-CoV-2 viral copies this assay can detect is 131 copies/mL. A negative result does not preclude SARS-Cov-2 infection and should not be used as the sole basis for treatment or other patient management decisions. A negative result may occur with  improper specimen collection/handling, submission of specimen other than nasopharyngeal swab, presence of viral mutation(s) within the areas targeted by this assay, and inadequate number of viral copies (<131 copies/mL). A negative result must be combined with clinical observations, patient history, and epidemiological information. The expected result is Negative. Fact Sheet for Patients:  PinkCheek.be Fact Sheet for Healthcare Providers:   GravelBags.it This test is not yet ap proved or cleared by the [place] FDA and  has been authorized for detection and/or diagnosis of SARS-CoV-2 by FDA under an Emergency Use Authorization (EUA). This EUA will remain  in effect (meaning this test can be used) for the duration of the COVID-19 declaration under Section 564(b)(1) of the Act, 21 U.S.C. section 360bbb-3(b)(1), unless the authorization is terminated or revoked sooner.    Influenza A by PCR NEGATIVE NEGATIVE Final   Influenza B by PCR NEGATIVE NEGATIVE Final    Comment: (NOTE) The Xpert Xpress SARS-CoV-2/FLU/RSV assay is intended as an aid in  the diagnosis of influenza from Nasopharyngeal swab specimens and  should not be used as a  sole basis for treatment. Nasal washings and  aspirates are unacceptable for Xpert Xpress SARS-CoV-2/FLU/RSV  testing. Fact Sheet for Patients: PinkCheek.be Fact Sheet for Healthcare Providers: GravelBags.it This test is not yet approved or cleared by the [place] FDA and  has been authorized for detection and/or diagnosis of SARS-CoV-2 by  FDA under an Emergency Use Authorization (EUA). This EUA will remain  in effect (meaning this test can be used) for the duration of the  Covid-19 declaration under Section 564(b)(1) of the Act, 21  U.S.C. section 360bbb-3(b)(1), unless the authorization is  terminated or revoked. Performed at [facility], [place] [address], [place], [place] [address]      Radiology Studies: EEG  Result Date: 07/13/2019 Lora Havens, MD     07/14/2019  9:54 AM Patient Name: Cookie Pore MRN: [id number] Epilepsy Attending: Lora Havens Referring Physician/Provider: Dr Kerney Elbe Date: 07/13/2019 Duration: 22.34 mins Patient history: 85 year old female presenting with awake unresponsive state and bilateral hand tremors. EEG to evaluate for seizures. Level of  alertness: lethargic AEDs during EEG study: Ativan Technical aspects: This EEG study was done with scalp electrodes positioned according to the 10-20 International system of electrode placement. Electrical activity was acquired at a sampling rate of 500Hz  and reviewed with a high frequency filter of 70Hz  and a low frequency filter of 1Hz . EEG data were recorded continuously and digitally stored. DESCRIPTION: No clear posterior dominant rhythm was seen. EEG showed continuous generalized polymorphic 2-3hz  delta slowing admixed with 8-9Hz  alpha activity.Triphasic waves, generalized, maximal bifrontal were also seen. Hyperventilation and photic stimulation were not performed. ABNORMALITY - Continuous slow, generalized - Triphasic waves, generalized IMPRESSION: This study is suggestive of moderate diffuse encephalopathy, non specific to etiology but could be secondary to toxic-metabolic causes. No seizures or epileptiform discharges were seen throughout the recording. Dr Cheral Marker was notified. Lora Havens   CT HEAD CODE STROKE WO CONTRAST  Addendum Date: 07/13/2019   ADDENDUM REPORT: 07/13/2019 21:26 ADDENDUM: These results were communicated to Dr. Cheral Marker at 9:13 pm on 07/13/2019 by text page via the [facility] messaging system. Electronically Signed   By: Logan Bores M.D.   On: 07/13/2019 21:26   Result Date: 07/13/2019 CLINICAL DATA:  Code stroke.  Altered mental status.  Aphasia. EXAM: CT HEAD WITHOUT CONTRAST TECHNIQUE: Contiguous axial images were obtained from the base of the skull through the vertex without intravenous contrast. COMPARISON:  05/28/2018 FINDINGS: Brain: There is no evidence of acute infarct, intracranial hemorrhage, mass, midline shift, or extra-axial fluid collection. The ventricles and sulci are within normal limits for age. Vascular: No hyperdense vessel. Skull: Hyperostosis and dural ossification as previously seen. No acute fracture or destructive osseous lesion. Sinuses/Orbits: The  visualized paranasal sinuses and mastoid air cells are clear. Bilateral cataract extraction. Other: None. ASPECTS [facility] Stroke Program Early CT Score) - Ganglionic level infarction (caudate, lentiform nuclei, internal capsule, insula, M1-M3 cortex): 7 - Supraganglionic infarction (M4-M6 cortex): 3 Total score (0-10 with 10 being normal): 10 IMPRESSION: 1. No evidence of acute intracranial abnormality. 2. ASPECTS is 10. Electronically Signed: By: Logan Bores M.D. On: 07/13/2019 21:13    Scheduled Meds: . aspirin EC  81 mg Oral Daily  . carvedilol  6.25 mg Oral BID WC  . enoxaparin (LOVENOX) injection  40 mg Subcutaneous Q24H  . lisinopril  20 mg Oral Daily  . QUEtiapine  25 mg Oral QHS   Continuous Infusions: . sodium chloride 85 mL/hr at 07/14/19 0810  LOS: 0 days   Marylu Lund, MD [facility] Pager On Amion  If 7PM-7AM, please contact night-coverage 07/14/2019, 7:06 PM

## 2019-07-14 NOTE — ED Notes (Signed)
Pt transferred to hospital bed for comfort, pt ambulated well around the room with 2 staff assist

## 2019-07-14 NOTE — ED Notes (Signed)
Pt eating breakfast 

## 2019-07-14 NOTE — Progress Notes (Signed)
NEW ADMISSION NOTE New Admission Note:   Arrival Method: bed from ED Mental Orientation: alert and oriented x1 (self only) Telemetry: box 14: ST to AFIB Assessment: Completed Skin: intact, bruise on right hip  IV: LAC and LFA Pain: 0 Tubes: purwick in place  Safety Measures: Safety Fall Prevention Plan has been given, discussed and signed Admission: Completed [facility] Orientation: Patient has been orientated to the room, unit and staff.  Family: daughter at bedside  Orders have been reviewed and implemented. Will continue to monitor the patient. Call light has been placed within reach and bed alarm has been activated.   Baldo Ash, RN

## 2019-07-14 NOTE — Procedures (Signed)
Patient Name: Donna Butler  MRN: [id number]  Epilepsy Attending: Lora Havens  Referring Physician/Provider: Dr Kerney Elbe Date: 07/13/2019 Duration: 22.34 mins  Patient history: 85 year old female presenting with awake unresponsive state and bilateral hand tremors. EEG to evaluate for seizures.  Level of alertness: lethargic  AEDs during EEG study: Ativan  Technical aspects: This EEG study was done with scalp electrodes positioned according to the 10-20 International system of electrode placement. Electrical activity was acquired at a sampling rate of 500Hz  and reviewed with a high frequency filter of 70Hz  and a low frequency filter of 1Hz . EEG data were recorded continuously and digitally stored.   DESCRIPTION: No clear posterior dominant rhythm was seen. EEG showed continuous generalized polymorphic 2-3hz  delta slowing admixed with 8-9Hz  alpha activity.Triphasic waves, generalized, maximal bifrontal were also seen. Hyperventilation and photic stimulation were not performed.  ABNORMALITY - Continuous slow, generalized - Triphasic waves, generalized  IMPRESSION: This study is suggestive of moderate diffuse encephalopathy, non specific to etiology but could be secondary to toxic-metabolic causes. No seizures or epileptiform discharges were seen throughout the recording.  Dr Cheral Marker was notified.  [name] [name]

## 2019-07-14 NOTE — Hospital Course (Signed)
Donna Butler is an 85 year old African-American female with PMH hypertension, hyperlipidemia, dementia who presented to the ER after being less responsive at home with bilateral hand tremors.  There was concern for possible stroke versus seizure activity on arrival in the ER.  Neurology was consulted and evaluated the patient bedside.  Code stroke was canceled.  An EEG was performed on arrival to the ER as well which was negative for seizure activity. She was last hospitalized the end of February 2021 due to chest pain.  She underwent stress testing which was negative for reversible ischemia.  At that time she also had hyponatremia which responded well to IV fluids and Foley catheter placement. At time of discharge, her sodium had improved to 140.  On arrival to the ER tonight, her sodium is down to 119 and associated with concern for mentation changes beyond her perceived dementia from last hospitalization. On interview in the ER, no family is present and the patient is unable to provide any collateral information.  She mainly gives 1 word answers when asked questions but cannot elaborate.  She was volume repleted with 1.7 L normal saline.  She also received multiple runs of IV potassium. She is admitted for further hyponatremia work-up and treatment.

## 2019-07-14 NOTE — ED Notes (Signed)
Pt ate about 50% on breakfast tray, pt given more orange juice

## 2019-07-14 NOTE — H&P (Signed)
History and Physical    Donna Butler ZOX:[id number] DOB: 11-12-1934 DOA: 07/13/2019  PCP: Jilda Panda, MD Patient coming from: home  I have personally briefly reviewed patient's old medical records in [place]  Chief Complaint: family found patient in "unresponsive state"  HPI: [name] is an 85 year old African-American female with PMH hypertension, hyperlipidemia, dementia who presented to the ER after being less responsive at home with bilateral hand tremors.  There was concern for possible stroke versus seizure activity on arrival in the ER.  Neurology was consulted and evaluated the patient bedside.  Code stroke was canceled.  An EEG was performed on arrival to the ER as well which was negative for seizure activity. She was last hospitalized the end of February 2021 due to chest pain.  She underwent stress testing which was negative for reversible ischemia.  At that time she also had hyponatremia which responded well to IV fluids and Foley catheter placement. At time of discharge, her sodium had improved to 140.  On arrival to the ER tonight, her sodium is down to 119 and associated with concern for mentation changes beyond her perceived dementia from last hospitalization. On interview in the ER, no family is present and the patient is unable to provide any collateral information.  She mainly gives 1 word answers when asked questions but cannot elaborate.  She was volume repleted with 1.7 L normal saline.  She also received multiple runs of IV potassium. She is admitted for further hyponatremia work-up and treatment.    Review of Systems: As per HPI otherwise 10 point review of systems negative.   Past Medical History:  Diagnosis Date  . Dementia ([place])    "had a scan for [Alzheimer's] and they didn't find anything"  . High cholesterol   . Hypertension     Past Surgical History:  Procedure Laterality Date  . ABDOMINAL HYSTERECTOMY       reports that she has  never smoked. Her smokeless tobacco use includes chew. She reports that she does not drink alcohol or use drugs.  Allergies  Allergen Reactions  . Pantoprazole Other (See Comments)    Caused lethargy and an overall feeling of "just not feeling well"  . Penicillins Other (See Comments)    Reaction not recalled, but allergic Has patient had a PCN reaction causing immediate rash, facial/tongue/throat swelling, SOB or lightheadedness with hypotension: Unk Has patient had a PCN reaction causing severe rash involving mucus membranes or skin necrosis: No Has patient had a PCN reaction that required hospitalization: No Has patient had a PCN reaction occurring within the last 10 years: No If all of the above answers are "NO", then may proceed with Cephalosporin use.     Family History  Family history unknown: Yes    Prior to Admission medications   Medication Sig Start Date End Date Taking? Authorizing Provider  acetaminophen (TYLENOL) 500 MG tablet Take 500-1,000 mg by mouth every 6 (six) hours as needed for mild pain or headache.   Yes [provider]  aspirin EC 81 MG EC tablet Take 1 tablet (81 mg total) by mouth daily. 05/23/19  Yes Vann, Jessica U, DO  carvedilol (COREG) 6.25 MG tablet Take 6.25 mg by mouth 2 (two) times daily with a meal.   Yes [provider]  cetirizine (ZYRTEC) 10 MG tablet Take 10 mg by mouth daily as needed for allergies or rhinitis.    Yes [provider]  lisinopril (ZESTRIL) 20 MG tablet Take  20 mg by mouth daily.   Yes [provider]  lovastatin (MEVACOR) 40 MG tablet Take 80 mg by mouth at bedtime.    Yes [provider]  metoprolol tartrate (LOPRESSOR) 25 MG tablet Take 0.5 tablets (12.5 mg total) by mouth 2 (two) times daily. Patient not taking: Reported on 07/13/2019 05/22/19   Geradine Girt, DO  pantoprazole (PROTONIX) 40 MG tablet Take 1 tablet (40 mg total) by mouth daily. Patient not taking: Reported on  07/13/2019 05/23/19   Geradine Girt, DO    Physical Exam: Vitals:   07/14/19 0030 07/14/19 0100 07/14/19 0130 07/14/19 0145  BP: (!) 184/89 (!) 193/90 (!) 195/152 (!) 179/82  Pulse: 68 62 65 (!) 58  Resp: 18 (!) 24 14 19   Temp:      TempSrc:      SpO2: 100% 98% (!) 79% 97%  Weight:      Height:       General appearance: elderly woman laying in bed in no distress and appearing grossly confused/altered with certain questions.  Head: Normocephalic, without obvious abnormality Eyes: EOMI Lungs: clear to auscultation bilaterally Heart: regular rate and rhythm and S1, S2 normal Abdomen: normal findings: bowel sounds normal and soft, non-tender Extremities: no edema Skin: mobility and turgor normal Neurologic:moves all 4 extremities  Labs on Admission: I have personally reviewed following labs and imaging studies  CBC: Recent Labs  Lab 07/13/19 2059 07/13/19 2101  WBC 8.8  --   NEUTROABS 6.9  --   HGB 11.8* 13.9  HCT 35.1* 41.0  MCV 84.2  --   PLT 231  --    Basic Metabolic Panel: Recent Labs  Lab 07/13/19 2059 07/13/19 2101 07/13/19 2142  NA 119* 117* 119*  K 3.3* 3.3*  --   CL 83* 81*  --   CO2 24  --   --   GLUCOSE 108* 105*  --   BUN 6* 6*  --   CREATININE 0.60 0.50  --   CALCIUM 9.1  --   --   MG  --   --  1.5*   GFR: Estimated Creatinine Clearance: 42.9 mL/min (by C-G formula based on SCr of 0.5 mg/dL). Liver Function Tests: Recent Labs  Lab 07/13/19 2059  AST 27  ALT 25  ALKPHOS 86  BILITOT 1.1  PROT 6.8  ALBUMIN 3.7   No results for input(s): LIPASE, AMYLASE in the last 168 hours. No results for input(s): AMMONIA in the last 168 hours. Coagulation Profile: Recent Labs  Lab 07/13/19 2059  INR 1.1   Cardiac Enzymes: No results for input(s): CKTOTAL, CKMB, CKMBINDEX, TROPONINI in the last 168 hours. BNP (last 3 results) No results for input(s): PROBNP in the last 8760 hours. HbA1C: No results for input(s): HGBA1C in the last 72  hours. CBG: Recent Labs  Lab 07/13/19 2057  GLUCAP 102*   Lipid Profile: No results for input(s): CHOL, HDL, LDLCALC, TRIG, CHOLHDL, LDLDIRECT in the last 72 hours. Thyroid Function Tests: No results for input(s): TSH, T4TOTAL, FREET4, T3FREE, THYROIDAB in the last 72 hours. Anemia Panel: No results for input(s): VITAMINB12, FOLATE, FERRITIN, TIBC, IRON, RETICCTPCT in the last 72 hours. Urine analysis:    Component Value Date/Time   COLORURINE COLORLESS (A) 07/13/2019 2325   APPEARANCEUR CLEAR 07/13/2019 2325   LABSPEC 1.001 (L) 07/13/2019 2325   PHURINE 6.0 07/13/2019 2325   GLUCOSEU NEGATIVE 07/13/2019 2325   HGBUR MODERATE (A) 07/13/2019 2325   BILIRUBINUR NEGATIVE 07/13/2019 2325  KETONESUR NEGATIVE 07/13/2019 2325   PROTEINUR NEGATIVE 07/13/2019 2325   UROBILINOGEN 0.2 06/16/2018 1920   NITRITE NEGATIVE 07/13/2019 2325   LEUKOCYTESUR NEGATIVE 07/13/2019 2325    Radiological Exams on Admission: CT HEAD CODE STROKE WO CONTRAST  Addendum Date: 07/13/2019   ADDENDUM REPORT: 07/13/2019 21:26 ADDENDUM: These results were communicated to Dr. Cheral Marker at 9:13 pm on 07/13/2019 by text page via the [facility] messaging system. Electronically Signed   By: Logan Bores M.D.   On: 07/13/2019 21:26   Result Date: 07/13/2019 CLINICAL DATA:  Code stroke.  Altered mental status.  Aphasia. EXAM: CT HEAD WITHOUT CONTRAST TECHNIQUE: Contiguous axial images were obtained from the base of the skull through the vertex without intravenous contrast. COMPARISON:  05/28/2018 FINDINGS: Brain: There is no evidence of acute infarct, intracranial hemorrhage, mass, midline shift, or extra-axial fluid collection. The ventricles and sulci are within normal limits for age. Vascular: No hyperdense vessel. Skull: Hyperostosis and dural ossification as previously seen. No acute fracture or destructive osseous lesion. Sinuses/Orbits: The visualized paranasal sinuses and mastoid air cells are clear. Bilateral cataract  extraction. Other: None. ASPECTS [facility] Stroke Program Early CT Score) - Ganglionic level infarction (caudate, lentiform nuclei, internal capsule, insula, M1-M3 cortex): 7 - Supraganglionic infarction (M4-M6 cortex): 3 Total score (0-10 with 10 being normal): 10 IMPRESSION: 1. No evidence of acute intracranial abnormality. 2. ASPECTS is 10. Electronically Signed: By: Logan Bores M.D. On: 07/13/2019 21:13    EKG: Independently reviewed. NSR  Assessment/Plan  Acute encephalopathy - given less responsive state at home along with shaking there was concern in the ER for stroke vs seizure. EEG performed which was negative for seizure activity; upon interview she is soft spoken but able to answer some questions that are "yes" or "no".  - likely multifactorial in setting of hyponatremia,  overall further failure to thrive, and underlying dementia, but organic causes still being investigated  - check TSH, B12, ESR, NH3  Hyponatremia - she appears hypovolemic and as tho she has not maintained adequate nutrition well recently; workup so far seems more consistent with hypovolemic hyponatremia due to lack of eating well - BMP q4h and NS @ 85 cc/hr; avoid overcorrection; goal increase Na of 4-6 mmol/L in first 24 hours    DVT prophylaxis: enoxaparin (Lovenox) 14m SQ 2 hours prior to surgery then every day Code Status: Full code Family Communication: none Disposition Plan: pending PT eval  Consults called: neurology Admission status: inpatient    DDwyane Dee MD [facility] Pager: Secure chat via [place]ager # : [phone] If 7PM-7AM, please contact night-coverage [email or web address] Use universal [place] password for that web site. If you do not have the password, please call the hospital operator.  07/14/2019, 2:17 AM

## 2019-07-14 NOTE — ED Notes (Signed)
Family at bedside. 

## 2019-07-14 NOTE — ED Notes (Signed)
Pt found standing beside the bed naked with one of her IVs pulled out, black stool noted to pts brief in the floor. Pt cleaned and linens changed, pt placed back in bed and bed alarmed placed

## 2019-07-14 NOTE — ED Notes (Signed)
Lunch Tray Ordered @ 1043.

## 2019-07-15 ENCOUNTER — Inpatient Hospital Stay (HOSPITAL_COMMUNITY): Payer: Medicare HMO

## 2019-07-15 LAB — BASIC METABOLIC PANEL
Anion gap: 8 (ref 5–15)
BUN: 12 mg/dL (ref 8–23)
CO2: 23 mmol/L (ref 22–32)
Calcium: 8.6 mg/dL — ABNORMAL LOW (ref 8.9–10.3)
Chloride: 98 mmol/L (ref 98–111)
Creatinine, Ser: 1.93 mg/dL — ABNORMAL HIGH (ref 0.44–1.00)
GFR calc Af Amer: 27 mL/min — ABNORMAL LOW (ref >60)
GFR calc non Af Amer: 24 mL/min — ABNORMAL LOW (ref >60)
Glucose, Bld: 104 mg/dL — ABNORMAL HIGH (ref 70–99)
Potassium: 3.5 mmol/L (ref 3.5–5.1)
Sodium: 129 mmol/L — ABNORMAL LOW (ref 135–145)

## 2019-07-15 MED ORDER — ENSURE ENLIVE PO LIQD
237.0000 mL | Freq: Two times a day (BID) | ORAL | Status: DC
  Administered 2019-07-15 – 2019-07-17 (×4): 237 mL via ORAL

## 2019-07-15 MED ORDER — AMLODIPINE BESYLATE 5 MG PO TABS
5.0000 mg | ORAL_TABLET | Freq: Every day | ORAL | Status: DC
  Administered 2019-07-15 – 2019-07-17 (×3): 5 mg via ORAL
  Filled 2019-07-15 (×3): qty 1

## 2019-07-15 MED ORDER — ENOXAPARIN SODIUM 30 MG/0.3ML ~~LOC~~ SOLN
30.0000 mg | SUBCUTANEOUS | Status: DC
  Administered 2019-07-16 – 2019-07-17 (×2): 30 mg via SUBCUTANEOUS
  Filled 2019-07-15 (×2): qty 0.3

## 2019-07-15 MED ORDER — CARVEDILOL 12.5 MG PO TABS
12.5000 mg | ORAL_TABLET | Freq: Two times a day (BID) | ORAL | Status: DC
  Administered 2019-07-15 – 2019-07-17 (×4): 12.5 mg via ORAL
  Filled 2019-07-15 (×4): qty 1

## 2019-07-15 NOTE — Progress Notes (Signed)
OCCUPATIONAL THERAPY EVALUATION  Clinical Impressions: PLOF obtained from daughter. PTA, pt lives with daughter and was typically able to complete ADLs and mobility within home without AD. Daughter did assist pt with tub transfers and family assisted with IADLs. Pt's daughter works, but reports hope to have her sister stay with pt during the day. Presently, pt limited in safety due to confusion and impulsivity, overall Min A for transfers and mobility with RW. Attempted trial without RW with pt losing balance posteriorly and required therapist assist to correct. Pt Min A for toilet transfer, Mod A for toileting task for thorough hygiene and safety. If 24/7 supervision able to be provided, recommend pt to DC home with [place]. Will continue to follow acutely.    07/15/19 0900  OT Visit Information  Last OT Received On 07/15/19  Assistance Needed +1  History of Present Illness Pt is 85 yo female with hx HTN, HLD, dementia who presented to ED with decreased responsiveness and B hand tremors. Pt found to be hyponatremic and admitted with hyponatremia and toxic metabolic encephalopathy.  Precautions  Precautions Fall  Restrictions  Weight Bearing Restrictions No  Home Living  Family/patient expects to be discharged to: Private residence  Living Arrangements Children  Available Help at Discharge Family;Other (Comment) (Daughter Equities trader) works; other daughter can stay during day)  Type of [place] Access Level entry  [place] unit  [employer] Standard  Prior Function  Level of Independence Needs assistance  Gait / Transfers Assistance Needed Independent for household mobility without AD  ADL's / [place] for tub transfers, able to complete other ADLs independently. Daughters assist with IADLs (cooking, cleaning, med mgmt)  Comments Per daughter, pt typically oriented to time, place, etc.   Communication  Communication No difficulties   Pain Assessment  Pain Assessment No/denies pain  Cognition  Arousal/Alertness Awake/alert  Behavior During Therapy Restless;Impulsive  Overall Cognitive Status Impaired/Different from baseline  Area of Impairment Orientation;Attention;Memory;Safety/judgement;Problem solving  Orientation Level Place;Time;Situation  Current Attention Level Sustained  Safety/Judgement Decreased awareness of safety  Problem Solving Requires verbal cues  General Comments Pt typically oriented with less confusion at home, per daughter  Upper Extremity Assessment  Upper Extremity Assessment Overall WFL for tasks assessed  Lower Extremity Assessment  Lower Extremity Assessment Defer to PT evaluation  Cervical / Trunk Assessment  Cervical / Trunk Assessment Normal  ADL  Overall ADL's  Needs assistance/impaired  Eating/Feeding Set up;Sitting  Grooming Minimal assistance;Standing;Wash/dry hands  Grooming Details (indicate cue type and reason) Min A for appropriate sequencing during task   Upper Body Bathing Set up;Supervision/ safety;Sitting  Lower Body Bathing Minimal assistance;Sitting/lateral leans;Sit to/from stand  Upper Body Dressing  Set up;Supervision/safety;Sitting  Lower Body Dressing Minimal assistance;Sit to/from stand  Toilet Transfer Minimal assistance;Cueing for sequencing;Cueing for safety;Ambulation;Regular Toilet;RW  Armed forces technical officer Details (indicate cue type and reason) Min A for power up from regular toilet   Toileting- Clothing Manipulation and Hygiene Moderate assistance;Sitting/lateral lean;Sit to/from stand  Toileting - Clothing Manipulation Details (indicate cue type and reason) Mod A for thorough posterior hygiene and clothing mgmt   Functional mobility during ADLs Minimal assistance;Rolling walker;Cueing for safety;Cueing for sequencing  General ADL Comments Requires cues consistently for safety and sequencing   Bed Mobility  Overal bed mobility Needs Assistance  Bed Mobility  Supine to Sit;Sit to Supine  Supine to sit Min guard  Sit to supine Min guard  General bed mobility comments min guard for safety due to  impulsivity. Pt crawled back into bed, required min guard for safety   Transfers  Overall transfer level Needs assistance  Equipment used Rolling walker (2 wheeled)  Transfers Sit to/from [employer] Transfers  Sit to Stand Min assist  Stand pivot transfers Min assist  General transfer comment Min A for power up and safety, manuevering of RW  Balance  Overall balance assessment Needs assistance  Sitting-balance support No upper extremity supported;Feet supported  Sitting balance-Leahy Scale Good  Standing balance support No upper extremity supported;During functional activity  Standing balance-Leahy Scale Poor  OT - End of Session  Equipment Utilized During Treatment Gait belt;Rolling walker  Activity Tolerance Patient tolerated treatment well  Patient left in bed;with call bell/phone within reach;with bed alarm set  Nurse Communication Mobility status  OT Assessment  OT Recommendation/Assessment Patient needs continued OT Services  OT Visit Diagnosis Unsteadiness on feet (R26.81);Other abnormalities of gait and mobility (R26.89);Muscle weakness (generalized) (M62.81)  OT Problem List Decreased strength;Decreased activity tolerance;Impaired balance (sitting and/or standing);Decreased coordination;Decreased cognition;Decreased safety awareness;Decreased knowledge of use of DME or AE  OT Plan  OT Frequency (ACUTE ONLY) Min 3X/week  OT Treatment/Interventions (ACUTE ONLY) Self-care/ADL training;Therapeutic exercise;Energy conservation;DME and/or AE instruction;Therapeutic activities;Patient/family education  AM-PAC OT "6 Clicks" Daily Activity Outcome Measure (Version 2)  Help from another person eating meals? 3  Help from another person taking care of personal grooming? 3  Help from another person toileting, which includes using toliet, bedpan,  or urinal? 3  Help from another person bathing (including washing, rinsing, drying)? 3  Help from another person to put on and taking off regular upper body clothing? 3  Help from another person to put on and taking off regular lower body clothing? 3  6 Click Score 18  OT Recommendation  Follow Up Recommendations Home health OT;Supervision/Assistance - 24 hour  OT Equipment Other (comment) (TBD)  Individuals Consulted  Consulted and Agree with Results and Recommendations Patient  Acute Rehab OT Goals  Patient Stated Goal none stated   OT Goal Formulation With patient  Time For Goal Achievement 07/29/19  Potential to Achieve Goals Good  OT Time Calculation  OT Start Time (ACUTE ONLY) 1011  OT Stop Time (ACUTE ONLY) 1033  OT Time Calculation (min) 22 min  OT General Charges  $OT Visit 1 Visit  OT Evaluation  $OT Eval Moderate Complexity 1 Mod  Written Expression  Dominant Hand Right

## 2019-07-15 NOTE — TOC Initial Note (Signed)
Transition of Care [facility]) - Initial/Assessment Note    Patient Details  Name: Donna Butler MRN: [id number] Date of Birth: Dec 15, 1934  [facility] CM/SW Contact:    Bartholomew Crews, RN Phone Number: [phone] 07/15/2019, 2:13 PM  Clinical Narrative:                  Spoke with patient's daughter, Marzetta Board, on the phone. PTA patient home with daughter and independent with her basic needs (feeding, dressing, walking).   Verified PCP, pharmacy, and contact information in Epic as correct.   Discussed recommendations for [facility] and DME. Verified patient not currently active with HH, and no DME. [facility]erral accepted by [facility] RN and PT. Patient will need [place] orders for RN and PT with Face to Face at discharge. Daughter agreeable to patient getting a RW. Referral sent to [facility] who will deliver walker to room once DME ordered. Patient will need DME order for RW.   Daughter to provide transportation home at time of discharge.   TOC following for transition needs.   Expected Discharge Plan: [place] Barriers to Discharge: Continued Medical Work up   Patient Goals and CMS Choice Patient states their goals for this hospitalization and ongoing recovery are:: return home with daughter CMS Medicare.gov Compare Post Acute Care list provided to:: Patient Represenative (must comment)(daughter, Donley Redder) Choice offered to / list presented to : Adult Children  Expected Discharge Plan and Services Expected Discharge Plan: [place] In-house Referral: NA Discharge Planning Services: CM Consult Post Acute Care Choice: Home Health, Durable Medical Equipment Living arrangements for the past 2 months: Single Family Home                 DME Arranged: [place]ford Rile DME Agency: [facility] Date DME Agency Contacted: 07/15/19 Time DME Agency Contacted: [phone] Representative spoke with at DME Agency: [place]: PT [place]: [facility]  Health Date [place]: 07/15/19 Time [place]: 49 Representative spoke with at [place]: [place]  Prior Living Arrangements/Services Living arrangements for the past 2 months: [place] with:: Self, Adult Children          Need for Family Participation in Patient Care: Yes (Comment) Care giver support system in place?: Yes (comment)   Criminal Activity/Legal Involvement Pertinent to Current Situation/Hospitalization: No - Comment as needed  Activities of Daily Living Home Assistive Devices/Equipment: Eyeglasses ADL Screening (condition at time of admission) Patient's cognitive ability adequate to safely complete daily activities?: Yes Is the patient deaf or have difficulty hearing?: No Does the patient have difficulty seeing, even when wearing glasses/contacts?: No Does the patient have difficulty concentrating, remembering, or making decisions?: Yes Patient able to express need for assistance with ADLs?: Yes Does the patient have difficulty dressing or bathing?: No Independently performs ADLs?: Yes (appropriate for developmental age) Does the patient have difficulty walking or climbing stairs?: Yes Weakness of Legs: Both Weakness of Arms/Hands: None  Permission Sought/Granted                  Emotional Assessment       Orientation: : Oriented to Self, Oriented to Place Alcohol / Substance Use: Not Applicable Psych Involvement: No (comment)  Admission diagnosis:  Hypokalemia [E87.6] Hyponatremia [E87.1] Altered mental status, unspecified altered mental status type [R41.82] Patient Active Problem List   Diagnosis Date Noted  . Hyponatremia 07/14/2019  . Chest pain 05/20/2019  . Dyslipidemia  05/20/2019  . Dementia with behavioral disturbance ([place]) 05/20/2019  . Bilateral hydronephrosis 05/20/2019  . Tobacco dependence due to chewing tobacco 05/20/2019  . Colitis presumed infectious 07/08/2017  . Hypokalemia 07/08/2017  .  Hypertension 07/08/2017  . Colitis 07/08/2017  . Dilation of biliary tract 07/08/2017  . Splenic lesion 07/08/2017   PCP:  Jilda Panda, MD Pharmacy:   [id number] [place] [address]Phone: [phone]: [phone]    Social Determinants of Health (SDOH) Interventions    Readmission Risk Interventions No flowsheet data found.

## 2019-07-15 NOTE — Progress Notes (Signed)
Subjective: No acute events overnight.  Patient was working with physical therapy.  States she is feeling well.  States she lives at home with her daughter.  ROS: negative except above  Examination  Vital signs in last 24 hours: Temp:  [97.8 F (36.6 C)-98.6 F (37 C)] 97.8 F (36.6 C) (04/23 0815) Pulse Rate:  [63-96] 72 (04/23 0815) Resp:  [16-17] 17 (04/23 0815) BP: (135-184)/(71-113) 155/72 (04/23 0815) SpO2:  [98 %-99 %] 98 % (04/23 0815) Weight:  [54.5 kg] 54.5 kg (04/22 2200)  General: lying in bed, not in apparent distress CVS: pulse-normal rate and rhythm RS: breathing comfortably, CTAB Extremities: normal, warm  Neuro: MS: Alert, oriented to place and person, follows commands, able to name 2 objects (pen and key) but then called myself on TV and was unable to name the cell phone.  At times appeared to be perseverating, also appears to have frontal release signs including forced grasp reflex and glabellar tap CN: pupils equal and reactive,  EOMI, face symmetric, tongue midline, normal sensation over face Motor: 4+/5 strength in all 4 extremities  Basic Metabolic Panel: Recent Labs  Lab 07/13/19 06-01-99 07/13/19 05-31-40 07/14/19 0824 07/14/19 0824 07/14/19 1304 07/14/19 1304 07/14/19 1827 07/14/19 06-01-07 07/15/19 0645  NA   < > 119* 124*  --  127*  --  127* 125* 129*  K   < >  --  4.2  --  3.2*  --  3.5 3.5 3.5  CL   < >  --  90*  --  98  --  95* 93* 98  CO2   < >  --  21*  --  20*  --  22 19* 23  GLUCOSE   < >  --  86  --  125*  --  109* 99 104*  BUN   < >  --  6*  --  8  --  10 10 12   CREATININE   < >  --  0.69  --  0.93  --  0.94 1.24* 1.93*  CALCIUM   < >  --  9.3   < > 7.7*   < > 8.8* 8.5* 8.6*  MG  --  1.5* 1.9  --   --   --   --   --   --    < > = values in this interval not displayed.    CBC: Recent Labs  Lab 07/13/19 May 31, 2057 07/13/19 06/01/99 07/14/19 0241  WBC 8.8  --  8.3  NEUTROABS 6.9  --  6.8  HGB 11.8* 13.9 9.4*  HCT 35.1* 41.0 28.6*  MCV 84.2  --   84.9  PLT 231  --  172     Coagulation Studies: Recent Labs    07/13/19 05-31-57  LABPROT 13.8  INR 1.1    Imaging CT head without contrast 07/13/2019: No acute abnormality.   ASSESSMENT AND PLAN: 85 year old female presented with alteration of awareness.  Sodium on arrival was 117, ammonia 40.  Hyponatremia Hyperammonemia Acute metabolic encephalopathy Dementia Hyperbilirubinemia Hypoproteinemia Microcytic anemia -EEG on arrival did not show any potential epileptogenicity. -Encephalopathy likely secondary to hyponatremia, hyperammonemia and hyperbilirubinemia in setting of dementia. - Continue slow correction of sodium and correction of other metabolic abnormalities -Continue follow-up with neurology outpatient provider for management of dementia  Thank you for allowing [place] to participate in the care of this patient.  Neurology will sign off.  Please page neuro hospitalist for any further questions after 5 PM  I have spent a total of 25  minutes with the patient reviewing hospital notes,  test results, labs and examining the patient as well as establishing an assessment and plan that was discussed personally with the patient.  > 50% of time was spent in direct patient care.   Zeb Comfort Epilepsy [facility] For questions after 5pm please refer to AMION to reach the Neurologist on call

## 2019-07-15 NOTE — Progress Notes (Signed)
Initial Nutrition Assessment  DOCUMENTATION CODES:   Not applicable  INTERVENTION:   Ensure Enlive po BID, each supplement provides 350 kcal and 20 grams of protein   NUTRITION DIAGNOSIS:   Inadequate oral intake related to decreased appetite, lethargy/confusion as evidenced by per patient/family report.  GOAL:   Patient will meet greater than or equal to 90% of their needs   MONITOR:   PO intake, Supplement acceptance, Labs, Weight trends  REASON FOR ASSESSMENT:   Consult Assessment of nutrition requirement/status  ASSESSMENT:   85 yo female admitted with acute toxic metabolic encephalopathy, hyponatremia. PMH includes HTN, HLD, dementia   RD working remotely.  Pt alert and oriented x 1; unable to obtain diet and weight history from patient at this time.   Pt on Regular diet, no recorded po intake.   Per weight encounters, no weight loss noted. Current weight 54.5 kg.   Labs: sodium 129 (L), potassium wdl Meds: NS at 85 ml/hr   Diet Order:   Diet Order            Diet regular Room service appropriate? Yes; Fluid consistency: Thin  Diet effective now              EDUCATION NEEDS:   Not appropriate for education at this time  Skin:  Skin Assessment: Reviewed RN Assessment  Last BM:  4/22  Height:   Ht Readings from Last 1 Encounters:  07/13/19 5' 2"  (1.575 m)    Weight:   Wt Readings from Last 1 Encounters:  07/14/19 54.5 kg    BMI:  Body mass index is 21.98 kg/m.  Estimated Nutritional Needs:   Kcal:  1400-1600 kcals  Protein:  65-75 g  Fluid:  >/= 1.4 L   Kerman Passey MS, RDN, LDN, CNSC RD Pager Number and Weekend/On-Call After Hours Pager Located in [place]

## 2019-07-15 NOTE — Evaluation (Signed)
Physical Therapy Evaluation Patient Details Name: Donna Butler MRN: [id number] DOB: 1934-04-17 Today's Date: 07/15/2019   History of Present Illness  Pt is 85 yo female with hx HTN, HLD, dementia who presented to ED with decreased responsiveness and B hand tremors. Pt found to be hyponatremic and admitted with hyponatremia and toxic metabolic encephalopathy.  Clinical Impression  Pt admitted with above diagnosis. Pt presenting with generalized decrease in safety due to confusion and impulsivity.  Pt more confused than baseline per conversation with daughter.  Typically, pt able to ambulate in household without AD.  Today pt was unsteady and required RW for ambulation in room.   Pt currently with functional limitations due to the deficits listed below (see PT Problem List). Pt will benefit from skilled PT to increase their independence and safety with mobility to allow discharge to the venue listed below.  If family able to provide 24 hr support -r ecommend home with HHPT.      Follow Up Recommendations Home health PT;Supervision/Assistance - 24 hour    Equipment Recommendations  Rolling walker with 5" wheels    Recommendations for Other Services       Precautions / Restrictions Precautions Precautions: Fall Restrictions Weight Bearing Restrictions: No      Mobility  Bed Mobility Overal bed mobility: Needs Assistance Bed Mobility: Supine to Sit;Sit to Supine     Supine to sit: Min guard Sit to supine: Min guard   General bed mobility comments: min guard for safety;  pt crawled back into bed requiring assist to guard for safety  Transfers Overall transfer level: Needs assistance Equipment used: Rolling walker (2 wheeled) Transfers: Sit to/from Stand Sit to Stand: Min assist         General transfer comment: for steadying  Ambulation/Gait Ambulation/Gait assistance: Min assist;Mod assist Gait Distance (Feet): 20 Feet(x2) Assistive device: Rolling walker (2  wheeled);None Gait Pattern/deviations: Staggering left;Staggering right;Step-through pattern     General Gait Details: Pt tending to leave RW behind- had LOB without AD requiring mod A for safety; Required min A with RW for maneuvaring walker and stability  Stairs            Wheelchair Mobility    Modified Rankin (Stroke Patients Only)       Balance Overall balance assessment: Needs assistance Sitting-balance support: No upper extremity supported;Feet supported Sitting balance-Leahy Scale: Good     Standing balance support: No upper extremity supported;During functional activity Standing balance-Leahy Scale: Poor                               Pertinent Vitals/Pain Pain Assessment: No/denies pain    Home Living Family/patient expects to be discharged to:: Private residence Living Arrangements: Children Available Help at Discharge: Family;Other (Comment)(Lives with daughter Marzetta Board) who works but reports other daughter could likely stay with her during that time) Type of Home: House Home Access: Level entry         Additional Comments: unknown    Prior Function Level of Independence: Needs assistance   Gait / Transfers Assistance Needed: Independent for household mobility without AD  ADL's / Homemaking Assistance Needed: Reports daughter assist with cooking  Comments: OT called and spoke with daughter Marzetta Board for PLOF/home set up     Hand Dominance   Dominant Hand: Right    Extremity/Trunk Assessment   Upper Extremity Assessment Upper Extremity Assessment: Overall WFL for tasks assessed    Lower Extremity Assessment  Lower Extremity Assessment: Overall WFL for tasks assessed    Cervical / Trunk Assessment Cervical / Trunk Assessment: Normal  Communication   Communication: No difficulties  Cognition Arousal/Alertness: Awake/alert Behavior During Therapy: Restless;Impulsive Overall Cognitive Status: Impaired/Different from baseline Area  of Impairment: Orientation;Attention;Memory;Safety/judgement;Problem solving                 Orientation Level: Place;Time;Situation Current Attention Level: Sustained Memory: Decreased short-term memory   Safety/Judgement: Decreased awareness of safety   Problem Solving: Requires verbal cues;Requires tactile cues General Comments: Pt typically oriented with less confusion at home, per daughter      General Comments      Exercises     Assessment/Plan    PT Assessment Patient needs continued PT services  PT Problem List Decreased strength;Decreased mobility;Decreased safety awareness;Decreased range of motion;Decreased coordination;Decreased activity tolerance;Decreased balance;Decreased knowledge of use of DME       PT Treatment Interventions DME instruction;Therapeutic activities;Cognitive remediation;Gait training;Therapeutic exercise;Patient/family education;Stair training;Balance training;Functional mobility training;Neuromuscular re-education    PT Goals (Current goals can be found in the Care Plan section)  Acute Rehab PT Goals PT Goal Formulation: Patient unable to participate in goal setting Time For Goal Achievement: 07/29/19 Potential to Achieve Goals: Good    Frequency Min 3X/week   Barriers to discharge        Co-evaluation               AM-PAC PT "6 Clicks" Mobility  Outcome Measure Help needed turning from your back to your side while in a flat bed without using bedrails?: None Help needed moving from lying on your back to sitting on the side of a flat bed without using bedrails?: None Help needed moving to and from a bed to a chair (including a wheelchair)?: A Little Help needed standing up from a chair using your arms (e.g., wheelchair or bedside chair)?: A Little Help needed to walk in hospital room?: A Little Help needed climbing 3-5 steps with a railing? : A Little 6 Click Score: 20    End of Session Equipment Utilized During  Treatment: Gait belt Activity Tolerance: Patient tolerated treatment well Patient left: in bed;with call bell/phone within reach;with bed alarm set Nurse Communication: Mobility status PT Visit Diagnosis: Unsteadiness on feet (R26.81)    Time: 1010-1035 PT Time Calculation (min) (ACUTE ONLY): 25 min   Charges:   PT Evaluation $PT Eval Low Complexity: 1 Low          Maggie Font, PT Acute Rehab Services Pager [phone] [facility] [phone] [facility] [phone]  Karlton Lemon 07/15/2019, 11:06 AM

## 2019-07-15 NOTE — Plan of Care (Signed)
  Problem: Pain Managment: Goal: General experience of comfort will improve Outcome: Progressing   

## 2019-07-15 NOTE — Plan of Care (Signed)
  Problem: Activity: Goal: Risk for activity intolerance will decrease Outcome: Progressing   

## 2019-07-15 NOTE — Progress Notes (Signed)
Bladder scan performed revealed  628 cc.,[place],NP text paged. Order received to do In and Out cath. Performed in and out cath aseptically and got 1000 cc out. Patient tolerated well.Will continue to monitor. Janete Quilling, Wonda Cheng, Therapist, sports

## 2019-07-15 NOTE — Progress Notes (Addendum)
PROGRESS NOTE    Donna Butler  UMP:[id number] DOB: [birth year] DOA: 07/13/2019 PCP: Jilda Panda, MD    Brief Narrative:  85yo with hx HTN, HLD, dementia presenting with decreased responsiveness and B hand tremors. Pt found to be hyponatremic  Assessment & Plan:   Active Problems:   Hyponatremia  Acute toxic metabolic encephalopathy - given less responsive state at home along with shaking there was concern in the ER for stroke vs seizure. EEG performed which was negative for seizure activity - likely multifactorial in setting of hyponatremia in the setting of underlying dementia -Appears to converse appropriately and is very pleasant this AM -Cont to treat below hyponatremia - TSH 2.062, B12 1220, ESR 0, NH3 40 - Therapy recs for HHPT  Hyponatremia -Appears hypovolemic and dehydrated on exam -Pt was continued on NS at 85cc/hr -Sodium is slowly trending up, currently 129 thsi AM -Will increase fluid rate to 100cc/hr  Dementia -Family had earlier noted pt has hx of sundowning and requiring PRN haldol -thus far good results with QHS seroquel while in hospital. Pt reported sleeping well -EKG reviewed, QTc of 468  Acute renal failure -Cr increased to 1.93 this AM -have stopped ACEI -Increased IVF rate -have ordered renal US, performed. Pending results  DVT prophylaxis: Lovenox subq Code Status: Full Family Communication: Pt in room, family not at bedside  Status is: Inpatient  Remains inpatient appropriate because:IV treatments appropriate due to intensity of illness or inability to take PO and Inpatient level of care appropriate due to severity of illness   Dispo: The patient is from: Home              Anticipated d/c is to: Home, pending eval by PT/OT              Anticipated d/c date is: 1 day              Patient currently is not medically stable to d/c.  Consultants:   Neurology  Procedures:   EEG  Antimicrobials: Anti-infectives (From  admission, onward)   None      Subjective: No complaints this AM  Objective: Vitals:   07/15/19 0500 07/15/19 0607 07/15/19 0815 07/15/19 1627  BP: (!) 184/74 (!) 178/79 (!) 155/72 (!) 150/73  Pulse: 63 65 72 73  Resp: 16  17 16   Temp: 98.6 F (37 C)  97.8 F (36.6 C) 98 F (36.7 C)  TempSrc: Oral  Oral Oral  SpO2: 98%  98% 96%  Weight:      Height:        Intake/Output Summary (Last 24 hours) at 07/15/2019 1659 Last data filed at 07/15/2019 1300 Gross per 24 hour  Intake 1561.98 ml  Output 450 ml  Net 1111.98 ml   Filed Weights   07/13/19 2129 07/14/19 2200  Weight: 55.8 kg 54.5 kg   Physical Exam General exam: Awake, laying in bed, in nad Respiratory system: Normal respiratory effort, no wheezing Cardiovascular system: regular rate, s1, s2 Gastrointestinal system: Soft, nondistended, positive BS Central nervous system: CN2-12 grossly intact, strength intact Extremities: Perfused, no clubbing Skin: Normal skin turgor, no notable skin lesions seen Psychiatry: Mood normal // no visual hallucinations    Data Reviewed: I have personally reviewed following labs and imaging studies  CBC: Recent Labs  Lab 07/13/19 2059 07/13/19 2101 07/14/19 0241  WBC 8.8  --  8.3  NEUTROABS 6.9  --  6.8  HGB 11.8* 13.9 9.4*  HCT 35.1* 41.0 28.6*  MCV 84.2  --  84.9  PLT 231  --  846   Basic Metabolic Panel: Recent Labs  Lab 07/13/19 2101 07/13/19 2142 07/14/19 0824 07/14/19 1304 07/14/19 1827 07/14/19 2209 07/15/19 0645  NA   < > 119* 124* 127* 127* 125* 129*  K   < >  --  4.2 3.2* 3.5 3.5 3.5  CL   < >  --  90* 98 95* 93* 98  CO2   < >  --  21* 20* 22 19* 23  GLUCOSE   < >  --  86 125* 109* 99 104*  BUN   < >  --  6* 8 10 10 12   CREATININE   < >  --  0.69 0.93 0.94 1.24* 1.93*  CALCIUM   < >  --  9.3 7.7* 8.8* 8.5* 8.6*  MG  --  1.5* 1.9  --   --   --   --    < > = values in this interval not displayed.   GFR: Estimated Creatinine Clearance: 17.8 mL/min (A)  (by C-G formula based on SCr of 1.93 mg/dL (H)). Liver Function Tests: Recent Labs  Lab 07/13/19 2059 07/14/19 0824  AST 27 38  ALT 25 20  ALKPHOS 86 94  BILITOT 1.1 1.7*  PROT 6.8 6.3*  ALBUMIN 3.7 3.7   No results for input(s): LIPASE, AMYLASE in the last 168 hours. Recent Labs  Lab 07/14/19 0824  AMMONIA 40*   Coagulation Profile: Recent Labs  Lab 07/13/19 2059  INR 1.1   Cardiac Enzymes: No results for input(s): CKTOTAL, CKMB, CKMBINDEX, TROPONINI in the last 168 hours. BNP (last 3 results) No results for input(s): PROBNP in the last 8760 hours. HbA1C: No results for input(s): HGBA1C in the last 72 hours. CBG: Recent Labs  Lab 07/13/19 2057  GLUCAP 102*   Lipid Profile: No results for input(s): CHOL, HDL, LDLCALC, TRIG, CHOLHDL, LDLDIRECT in the last 72 hours. Thyroid Function Tests: Recent Labs    07/14/19 0824  TSH 2.062   Anemia Panel: Recent Labs    07/14/19 0824  VITAMINB12 1,220*   Sepsis Labs: No results for input(s): PROCALCITON, LATICACIDVEN in the last 168 hours.  Recent Results (from the past 240 hour(s))  Respiratory Panel by RT PCR (Flu A&B, Covid) - Nasopharyngeal Swab     Status: None   Collection Time: 07/13/19 11:25 PM   Specimen: Nasopharyngeal Swab  Result Value Ref Range Status   SARS Coronavirus 2 by RT PCR NEGATIVE NEGATIVE Final    Comment: (NOTE) SARS-CoV-2 target nucleic acids are NOT DETECTED. The SARS-CoV-2 RNA is generally detectable in upper respiratoy specimens during the acute phase of infection. The lowest concentration of SARS-CoV-2 viral copies this assay can detect is 131 copies/mL. A negative result does not preclude SARS-Cov-2 infection and should not be used as the sole basis for treatment or other patient management decisions. A negative result may occur with  improper specimen collection/handling, submission of specimen other than nasopharyngeal swab, presence of viral mutation(s) within the areas  targeted by this assay, and inadequate number of viral copies (<131 copies/mL). A negative result must be combined with clinical observations, patient history, and epidemiological information. The expected result is Negative. Fact Sheet for Patients:  PinkCheek.be Fact Sheet for Healthcare Providers:  GravelBags.it This test is not yet ap proved or cleared by the [place] FDA and  has been authorized for detection and/or diagnosis of SARS-CoV-2 by FDA under an Emergency Use Authorization (EUA). This EUA will  remain  in effect (meaning this test can be used) for the duration of the COVID-19 declaration under Section 564(b)(1) of the Act, 21 U.S.C. section 360bbb-3(b)(1), unless the authorization is terminated or revoked sooner.    Influenza A by PCR NEGATIVE NEGATIVE Final   Influenza B by PCR NEGATIVE NEGATIVE Final    Comment: (NOTE) The Xpert Xpress SARS-CoV-2/FLU/RSV assay is intended as an aid in  the diagnosis of influenza from Nasopharyngeal swab specimens and  should not be used as a sole basis for treatment. Nasal washings and  aspirates are unacceptable for Xpert Xpress SARS-CoV-2/FLU/RSV  testing. Fact Sheet for Patients: PinkCheek.be Fact Sheet for Healthcare Providers: GravelBags.it This test is not yet approved or cleared by the [place] FDA and  has been authorized for detection and/or diagnosis of SARS-CoV-2 by  FDA under an Emergency Use Authorization (EUA). This EUA will remain  in effect (meaning this test can be used) for the duration of the  Covid-19 declaration under Section 564(b)(1) of the Act, 21  U.S.C. section 360bbb-3(b)(1), unless the authorization is  terminated or revoked. Performed at [facility], [place] [address], [place], [place] [address]      Radiology Studies: EEG  Result Date: 07/13/2019 Lora Havens, MD      07/14/2019  9:54 AM Patient Name: Donna Butler MRN: [id number] Epilepsy Attending: Lora Havens Referring Physician/Provider: Dr Kerney Elbe Date: 07/13/2019 Duration: 22.34 mins Patient history: 85 year old female presenting with awake unresponsive state and bilateral hand tremors. EEG to evaluate for seizures. Level of alertness: lethargic AEDs during EEG study: Ativan Technical aspects: This EEG study was done with scalp electrodes positioned according to the 10-20 International system of electrode placement. Electrical activity was acquired at a sampling rate of 500Hz  and reviewed with a high frequency filter of 70Hz  and a low frequency filter of 1Hz . EEG data were recorded continuously and digitally stored. DESCRIPTION: No clear posterior dominant rhythm was seen. EEG showed continuous generalized polymorphic 2-3hz  delta slowing admixed with 8-9Hz  alpha activity.Triphasic waves, generalized, maximal bifrontal were also seen. Hyperventilation and photic stimulation were not performed. ABNORMALITY - Continuous slow, generalized - Triphasic waves, generalized IMPRESSION: This study is suggestive of moderate diffuse encephalopathy, non specific to etiology but could be secondary to toxic-metabolic causes. No seizures or epileptiform discharges were seen throughout the recording. Dr Cheral Marker was notified. Lora Havens   CT HEAD CODE STROKE WO CONTRAST  Addendum Date: 07/13/2019   ADDENDUM REPORT: 07/13/2019 21:26 ADDENDUM: These results were communicated to Dr. Cheral Marker at 9:13 pm on 07/13/2019 by text page via the [facility] messaging system. Electronically Signed   By: Logan Bores M.D.   On: 07/13/2019 21:26   Result Date: 07/13/2019 CLINICAL DATA:  Code stroke.  Altered mental status.  Aphasia. EXAM: CT HEAD WITHOUT CONTRAST TECHNIQUE: Contiguous axial images were obtained from the base of the skull through the vertex without intravenous contrast. COMPARISON:  05/28/2018 FINDINGS: Brain: There is  no evidence of acute infarct, intracranial hemorrhage, mass, midline shift, or extra-axial fluid collection. The ventricles and sulci are within normal limits for age. Vascular: No hyperdense vessel. Skull: Hyperostosis and dural ossification as previously seen. No acute fracture or destructive osseous lesion. Sinuses/Orbits: The visualized paranasal sinuses and mastoid air cells are clear. Bilateral cataract extraction. Other: None. ASPECTS [facility] Stroke Program Early CT Score) - Ganglionic level infarction (caudate, lentiform nuclei, internal capsule, insula, M1-M3 cortex): 7 - Supraganglionic infarction (M4-M6 cortex): 3 Total score (0-10  with 10 being normal): 10 IMPRESSION: 1. No evidence of acute intracranial abnormality. 2. ASPECTS is 10. Electronically Signed: By: Logan Bores M.D. On: 07/13/2019 21:13    Scheduled Meds: . amLODipine  5 mg Oral Daily  . aspirin EC  81 mg Oral Daily  . carvedilol  12.5 mg Oral BID WC  . [START ON 07/16/2019] enoxaparin (LOVENOX) injection  30 mg Subcutaneous Q24H  . feeding supplement (ENSURE ENLIVE)  237 mL Oral BID BM  . QUEtiapine  25 mg Oral QHS   Continuous Infusions: . sodium chloride 85 mL/hr at 07/14/19 2100     LOS: 1 day   Marylu Lund, MD [facility] Pager On Amion  If 7PM-7AM, please contact night-coverage 07/15/2019, 4:59 PM

## 2019-07-16 LAB — BASIC METABOLIC PANEL
Anion gap: 7 (ref 5–15)
BUN: 12 mg/dL (ref 8–23)
CO2: 23 mmol/L (ref 22–32)
Calcium: 8.6 mg/dL — ABNORMAL LOW (ref 8.9–10.3)
Chloride: 110 mmol/L (ref 98–111)
Creatinine, Ser: 1.37 mg/dL — ABNORMAL HIGH (ref 0.44–1.00)
GFR calc Af Amer: 42 mL/min — ABNORMAL LOW (ref >60)
GFR calc non Af Amer: 36 mL/min — ABNORMAL LOW (ref >60)
Glucose, Bld: 95 mg/dL (ref 70–99)
Potassium: 3.2 mmol/L — ABNORMAL LOW (ref 3.5–5.1)
Sodium: 140 mmol/L (ref 135–145)

## 2019-07-16 LAB — GLUCOSE, CAPILLARY
Glucose-Capillary: 59 mg/dL — ABNORMAL LOW (ref 70–99)
Glucose-Capillary: 91 mg/dL (ref 70–99)
Glucose-Capillary: 94 mg/dL (ref 70–99)
Glucose-Capillary: 106 mg/dL — ABNORMAL HIGH (ref 70–99)

## 2019-07-16 MED ORDER — TAMSULOSIN HCL 0.4 MG PO CAPS
0.4000 mg | ORAL_CAPSULE | Freq: Every day | ORAL | Status: DC
  Administered 2019-07-16 – 2019-07-17 (×2): 0.4 mg via ORAL
  Filled 2019-07-16: qty 1

## 2019-07-16 MED ORDER — POTASSIUM CHLORIDE CRYS ER 20 MEQ PO TBCR
40.0000 meq | EXTENDED_RELEASE_TABLET | Freq: Once | ORAL | Status: AC
  Administered 2019-07-16: 40 meq via ORAL
  Filled 2019-07-16: qty 2

## 2019-07-16 MED ORDER — HALOPERIDOL LACTATE 5 MG/ML IJ SOLN
5.0000 mg | Freq: Once | INTRAMUSCULAR | Status: AC
  Administered 2019-07-16: 5 mg via INTRAVENOUS
  Filled 2019-07-16: qty 1

## 2019-07-16 NOTE — Progress Notes (Signed)
PROGRESS NOTE    Donna Butler  YWV:[id number] DOB: [birth year] DOA: 07/13/2019 PCP: Jilda Panda, MD    Brief Narrative:  85yo with hx HTN, HLD, dementia presenting with decreased responsiveness and B hand tremors. Pt found to be hyponatremic  Assessment & Plan:   Active Problems:   Hyponatremia  Acute toxic metabolic encephalopathy - given less responsive state at home along with shaking there was concern in the ER for stroke vs seizure. EEG performed which was negative for seizure activity - likely multifactorial in setting of hyponatremia in the setting of underlying dementia -Appears to converse appropriately and is very pleasant this AM -Cont to treat below hyponatremia - TSH 2.062, B12 1220, ESR 0, NH3 40 - Therapy recs for HHPT noted  Hyponatremia -Appeared hypovolemic and dehydrated on exam -Sodium normalized with IVF hydration -Will repeat bmet in AM  Dementia -Family had earlier noted pt has hx of sundowning and requiring PRN haldol -thus far good results with QHS seroquel while in hospital -recent EKG reviewed, QTc of 468  Acute renal failure -Cr peaked to 1.93 -have stopped ACEI -continue IVF -renal US reviewed. Findings of distended bladder -Pt now undergoing bladder scan with I/O cath if needed, see below  Bladder outlet obstruction -continue with PRN I/O caths -if continues to retain urine after 24hrs, would then consider indwelling cath and Urology follow-up, however given dementia, would be suboptimal as pt would have higher chance of pulling out catheter -Will start trial of flomax for now  DVT prophylaxis: Lovenox subq Code Status: Full Family Communication: Pt in room, family at bedside  Status is: Inpatient  Remains inpatient appropriate because:IV treatments appropriate due to intensity of illness or inability to take PO and Inpatient level of care appropriate due to severity of illness   Dispo: The patient is from: Home    Anticipated d/c is to: Home, pending eval by PT/OT              Anticipated d/c date is: 1 day              Patient currently is not medically stable to d/c.  Consultants:   Neurology  Procedures:   EEG  Antimicrobials: Anti-infectives (From admission, onward)   None      Subjective: Without complaints. Pleasantly confused  Objective: Vitals:   07/15/19 2213 07/16/19 0457 07/16/19 0845 07/16/19 1611  BP:  (!) 148/69 135/60 (!) 142/70  Pulse:  69 80 79  Resp:  20 18 16   Temp:  98.9 F (37.2 C) 98.5 F (36.9 C) 98.1 F (36.7 C)  TempSrc:  Oral Oral Oral  SpO2: 97% 96% 99% 99%  Weight:      Height:        Intake/Output Summary (Last 24 hours) at 07/16/2019 1713 Last data filed at 07/16/2019 1613 Gross per 24 hour  Intake 1944.82 ml  Output 3450 ml  Net -1505.18 ml   Filed Weights   07/13/19 2129 07/14/19 2200 07/15/19 1950  Weight: 55.8 kg 54.5 kg 54.3 kg   Physical Exam General exam: Conversant, in no acute distress Respiratory system: normal chest rise, clear, no audible wheezing Cardiovascular system: regular rhythm, s1-s2 Gastrointestinal system: Nondistended, nontender, pos BS Central nervous system: No seizures, no tremors Extremities: No cyanosis, no joint deformities Skin: No rashes, no pallor Psychiatry: Affect normal // mood seems normal  Data Reviewed: I have personally reviewed following labs and imaging studies  CBC: Recent Labs  Lab 07/13/19 2059 07/13/19 2101 07/14/19 0241  WBC 8.8  --  8.3  NEUTROABS 6.9  --  6.8  HGB 11.8* 13.9 9.4*  HCT 35.1* 41.0 28.6*  MCV 84.2  --  84.9  PLT 231  --  858   Basic Metabolic Panel: Recent Labs  Lab 07/13/19 2101 07/13/19 2142 07/14/19 0824 07/14/19 0824 07/14/19 1304 07/14/19 1827 07/14/19 2209 07/15/19 0645 07/16/19 0345  NA   < > 119* 124*   < > 127* 127* 125* 129* 140  K   < >  --  4.2   < > 3.2* 3.5 3.5 3.5 3.2*  CL   < >  --  90*   < > 98 95* 93* 98 110  CO2   < >  --  21*   <  > 20* 22 19* 23 23  GLUCOSE   < >  --  86   < > 125* 109* 99 104* 95  BUN   < >  --  6*   < > 8 10 10 12 12   CREATININE   < >  --  0.69   < > 0.93 0.94 1.24* 1.93* 1.37*  CALCIUM   < >  --  9.3   < > 7.7* 8.8* 8.5* 8.6* 8.6*  MG  --  1.5* 1.9  --   --   --   --   --   --    < > = values in this interval not displayed.   GFR: Estimated Creatinine Clearance: 25 mL/min (A) (by C-G formula based on SCr of 1.37 mg/dL (H)). Liver Function Tests: Recent Labs  Lab 07/13/19 2059 07/14/19 0824  AST 27 38  ALT 25 20  ALKPHOS 86 94  BILITOT 1.1 1.7*  PROT 6.8 6.3*  ALBUMIN 3.7 3.7   No results for input(s): LIPASE, AMYLASE in the last 168 hours. Recent Labs  Lab 07/14/19 0824  AMMONIA 40*   Coagulation Profile: Recent Labs  Lab 07/13/19 2059  INR 1.1   Cardiac Enzymes: No results for input(s): CKTOTAL, CKMB, CKMBINDEX, TROPONINI in the last 168 hours. BNP (last 3 results) No results for input(s): PROBNP in the last 8760 hours. HbA1C: No results for input(s): HGBA1C in the last 72 hours. CBG: Recent Labs  Lab 07/13/19 2057 07/16/19 1132 07/16/19 1215 07/16/19 1610  GLUCAP 102* 59* 91 94   Lipid Profile: No results for input(s): CHOL, HDL, LDLCALC, TRIG, CHOLHDL, LDLDIRECT in the last 72 hours. Thyroid Function Tests: Recent Labs    07/14/19 0824  TSH 2.062   Anemia Panel: Recent Labs    07/14/19 0824  VITAMINB12 1,220*   Sepsis Labs: No results for input(s): PROCALCITON, LATICACIDVEN in the last 168 hours.  Recent Results (from the past 240 hour(s))  Respiratory Panel by RT PCR (Flu A&B, Covid) - Nasopharyngeal Swab     Status: None   Collection Time: 07/13/19 11:25 PM   Specimen: Nasopharyngeal Swab  Result Value Ref Range Status   SARS Coronavirus 2 by RT PCR NEGATIVE NEGATIVE Final    Comment: (NOTE) SARS-CoV-2 target nucleic acids are NOT DETECTED. The SARS-CoV-2 RNA is generally detectable in upper respiratoy specimens during the acute phase of  infection. The lowest concentration of SARS-CoV-2 viral copies this assay can detect is 131 copies/mL. A negative result does not preclude SARS-Cov-2 infection and should not be used as the sole basis for treatment or other patient management decisions. A negative result may occur with  improper specimen collection/handling, submission of specimen other than nasopharyngeal  swab, presence of viral mutation(s) within the areas targeted by this assay, and inadequate number of viral copies (<131 copies/mL). A negative result must be combined with clinical observations, patient history, and epidemiological information. The expected result is Negative. Fact Sheet for Patients:  PinkCheek.be Fact Sheet for Healthcare Providers:  GravelBags.it This test is not yet ap proved or cleared by the [place] FDA and  has been authorized for detection and/or diagnosis of SARS-CoV-2 by FDA under an Emergency Use Authorization (EUA). This EUA will remain  in effect (meaning this test can be used) for the duration of the COVID-19 declaration under Section 564(b)(1) of the Act, 21 U.S.C. section 360bbb-3(b)(1), unless the authorization is terminated or revoked sooner.    Influenza A by PCR NEGATIVE NEGATIVE Final   Influenza B by PCR NEGATIVE NEGATIVE Final    Comment: (NOTE) The Xpert Xpress SARS-CoV-2/FLU/RSV assay is intended as an aid in  the diagnosis of influenza from Nasopharyngeal swab specimens and  should not be used as a sole basis for treatment. Nasal washings and  aspirates are unacceptable for Xpert Xpress SARS-CoV-2/FLU/RSV  testing. Fact Sheet for Patients: PinkCheek.be Fact Sheet for Healthcare Providers: GravelBags.it This test is not yet approved or cleared by the [place] FDA and  has been authorized for detection and/or diagnosis of SARS-CoV-2 by  FDA under  an Emergency Use Authorization (EUA). This EUA will remain  in effect (meaning this test can be used) for the duration of the  Covid-19 declaration under Section 564(b)(1) of the Act, 21  U.S.C. section 360bbb-3(b)(1), unless the authorization is  terminated or revoked. Performed at [facility], [place] [address], [place], [place] [address]      Radiology Studies: US RENAL  Result Date: 07/15/2019 CLINICAL DATA:  Acute renal failure EXAM: RENAL / URINARY TRACT ULTRASOUND COMPLETE COMPARISON:  None. FINDINGS: Right Kidney: Renal measurements: 10.1 x 5.9 x 5.4 cm = volume: 164.7 mL. Mild hydronephrosis. Left Kidney: Renal measurements: 9.8 x 7.1 x 6.0 cm = volume: 219.3 mL. Mild hydronephrosis, symmetric to the right. Bladder: Distended bladder. Other: Bilateral pleural effusions. IMPRESSION: 1. Bilateral symmetric mild hydronephrosis. Given the distended bladder, the finding is likely due to obstruction at the level of the pelvis, most likely bladder outlet obstruction. Electronically Signed   By: Dorise Bullion III M.D   On: 07/15/2019 17:37    Scheduled Meds: . amLODipine  5 mg Oral Daily  . aspirin EC  81 mg Oral Daily  . carvedilol  12.5 mg Oral BID WC  . enoxaparin (LOVENOX) injection  30 mg Subcutaneous Q24H  . feeding supplement (ENSURE ENLIVE)  237 mL Oral BID BM  . QUEtiapine  25 mg Oral QHS   Continuous Infusions: . sodium chloride 75 mL/hr at 07/16/19 1613     LOS: 2 days   Marylu Lund, MD [facility] Pager On Amion  If 7PM-7AM, please contact night-coverage 07/16/2019, 5:13 PM

## 2019-07-16 NOTE — Progress Notes (Signed)
Bodenheimer,NP texted to inform that patient is pulling off tele, pulling at IV and trying to crawl out of bed. Awaiting response.

## 2019-07-17 LAB — BASIC METABOLIC PANEL
Anion gap: 6 (ref 5–15)
BUN: 10 mg/dL (ref 8–23)
CO2: 24 mmol/L (ref 22–32)
Calcium: 8.9 mg/dL (ref 8.9–10.3)
Chloride: 111 mmol/L (ref 98–111)
Creatinine, Ser: 0.99 mg/dL (ref 0.44–1.00)
GFR calc Af Amer: >60 mL/min (ref >60)
GFR calc non Af Amer: 53 mL/min — ABNORMAL LOW (ref >60)
Glucose, Bld: 91 mg/dL (ref 70–99)
Potassium: 3.6 mmol/L (ref 3.5–5.1)
Sodium: 141 mmol/L (ref 135–145)

## 2019-07-17 LAB — GLUCOSE, CAPILLARY
Glucose-Capillary: 75 mg/dL (ref 70–99)
Glucose-Capillary: 102 mg/dL — ABNORMAL HIGH (ref 70–99)

## 2019-07-17 MED ORDER — ENOXAPARIN SODIUM 40 MG/0.4ML ~~LOC~~ SOLN: 40.0000 mg | SUBCUTANEOUS | Status: DC

## 2019-07-17 MED ORDER — CHLORHEXIDINE GLUCONATE CLOTH 2 % EX PADS: 6.0000 | MEDICATED_PAD | Freq: Every day | CUTANEOUS | Status: DC

## 2019-07-17 MED ORDER — CARVEDILOL 12.5 MG PO TABS: 12.5000 mg | ORAL_TABLET | Freq: Two times a day (BID) | ORAL | 0 refills | Status: AC

## 2019-07-17 MED ORDER — TAMSULOSIN HCL 0.4 MG PO CAPS: 0.4000 mg | ORAL_CAPSULE | Freq: Every day | ORAL | 0 refills | Status: AC

## 2019-07-17 MED ORDER — AMLODIPINE BESYLATE 5 MG PO TABS: 5.0000 mg | ORAL_TABLET | Freq: Every day | ORAL | 0 refills | Status: AC

## 2019-07-17 NOTE — Plan of Care (Signed)

## 2019-07-17 NOTE — Progress Notes (Signed)
Patient and daughter educated on d/c instructions. IV removed. Daughter demonstrated foley care with teach back. Questions answered. Taken by wheelchair to patient pick up.

## 2019-07-17 NOTE — Discharge Instructions (Signed)
[facility]. Will contact [facility] for RW order. Awaiting for DME order. Informed RN that the RW can be delivered home if they are not able to wait.

## 2019-07-17 NOTE — Progress Notes (Addendum)
Pt has been D/C today. Notified Brittney with [facility] that pt is going home today with a foley catheter. Contacted Keon with [employer] for RW order. Met with pt and daughter. Daughter doesn't want to wait for the RW. Informed them that [employer] will deliver the RW at home and daughter agrees. Informed them that [facility] will f/u for HHPT and RN. Keon made aware that daughter doesn't want to wait for the RW. RW will be delivered home.

## 2019-07-17 NOTE — Discharge Summary (Signed)
Physician Discharge Summary  Jumana Paccione FBP:[id number] DOB: [birth year] DOA: 07/13/2019  PCP: Jilda Panda, MD  Admit date: 07/13/2019 Discharge date: 07/17/2019  Admitted From:  Home Disposition:  home  Recommendations for Outpatient Follow-up:  1. Follow up with PCP in 2-3 weeks 2. Follow up with Urology as scheduled  Home Health:PT, OT  Equipment/Devices:Foley cath, rolling walker  Discharge Condition:Improved CODE STATUS:Full Diet recommendation: Regular   Brief/Interim Summary: 85yo with hx HTN, HLD, dementia presenting with decreased responsiveness and B hand tremors. Pt found to be hyponatremic  Discharge Diagnoses:  Active Problems:   Hyponatremia   Acute toxic metabolic encephalopathy - given less responsive state at home along with shaking there was concern in the ER for stroke vs seizure. EEG performed which was negative for seizure activity - likely multifactorial in setting of hyponatremia in the setting of underlying dementia -Appears to converse appropriately and is very pleasant this AM -Cont to treat below hyponatremia - TSH 2.062, B12 1220, ESR 0, NH3 40 - Therapy recs for HHPTOT noted  Hyponatremia -Appeared hypovolemic and dehydrated on exam -Sodium normalized with IVF hydration  Dementia -Family had earlier noted pt has hx of sundowning and requiring PRN haldol -good results with QHS seroquel while in hospital -recent EKG reviewed, QTc of 468  Acute renal failure -Cr peaked to 1.93 -have stopped ACEI -renal US reviewed. Findings of distended bladder -started flomax -Renal function normalized with I/o caths -failed voiding trials -Will need indwelling foley cath. Discussed with Urology. Given concerns of dementia and sundowning, Urology recommended using foley with 30cc balloon to prevent pt from pulling cath [facility] to follow for foley care.  -Per Urology, recommendation for family to call office on 4/26 (Monday) to arrange visit    Bladder outlet obstruction -failed voiding trials per above -on flomax -Will need to d/c home with foley cath as per above -Urology to follow as outpatient  Discharge Instructions   Allergies as of 07/17/2019      Reactions   Pantoprazole Other (See Comments)   Caused lethargy and an overall feeling of "just not feeling well"   Penicillins Other (See Comments)   Reaction not recalled, but allergic Has patient had a PCN reaction causing immediate rash, facial/tongue/throat swelling, SOB or lightheadedness with hypotension: Unk Has patient had a PCN reaction causing severe rash involving mucus membranes or skin necrosis: No Has patient had a PCN reaction that required hospitalization: No Has patient had a PCN reaction occurring within the last 10 years: No If all of the above answers are "NO", then may proceed with Cephalosporin use.      Medication List    STOP taking these medications   lisinopril 20 MG tablet Commonly known as: ZESTRIL   metoprolol tartrate 25 MG tablet Commonly known as: LOPRESSOR   pantoprazole 40 MG tablet Commonly known as: PROTONIX     TAKE these medications   acetaminophen 500 MG tablet Commonly known as: TYLENOL Take 500-1,000 mg by mouth every 6 (six) hours as needed for mild pain or headache.   amLODipine 5 MG tablet Commonly known as: NORVASC Take 1 tablet (5 mg total) by mouth daily. Start taking on: July 18, 2019   aspirin 81 MG EC tablet Take 1 tablet (81 mg total) by mouth daily.   carvedilol 12.5 MG tablet Commonly known as: COREG Take 1 tablet (12.5 mg total) by mouth 2 (two) times daily with a meal. What changed:   medication strength  how much to take  cetirizine 10 MG tablet Commonly known as: ZYRTEC Take 10 mg by mouth daily as needed for allergies or rhinitis.   lovastatin 40 MG tablet Commonly known as: MEVACOR Take 80 mg by mouth at bedtime.   tamsulosin 0.4 MG Caps capsule Commonly known as:  FLOMAX Take 1 capsule (0.4 mg total) by mouth daily. Start taking on: July 18, 2019            Durable Medical Equipment  (From admission, onward)         Start     Ordered   07/17/19 1552  For home use only DME Walker rolling  Once    Question Answer Comment  Walker: With [employer] Wheels   Patient needs a walker to treat with the following condition Dementia ([place])      07/17/19 1551         Follow-up Information    Jilda Panda, MD. Schedule an appointment as soon as possible for a visit in 2 week(s).   Specialty: Internal Medicine Contact information: [address] [phone]        [place]. Schedule an appointment as soon as possible for a visit.   Why: Per the Urologist Contact information: [place] [phone]         Allergies  Allergen Reactions  . Pantoprazole Other (See Comments)    Caused lethargy and an overall feeling of "just not feeling well"  . Penicillins Other (See Comments)    Reaction not recalled, but allergic Has patient had a PCN reaction causing immediate rash, facial/tongue/throat swelling, SOB or lightheadedness with hypotension: Unk Has patient had a PCN reaction causing severe rash involving mucus membranes or skin necrosis: No Has patient had a PCN reaction that required hospitalization: No Has patient had a PCN reaction occurring within the last 10 years: No If all of the above answers are "NO", then may proceed with Cephalosporin use.     Consultations:  Discussed case with Urology over phone  Procedures/Studies: EEG  Result Date: 07/13/2019 Lora Havens, MD     07/14/2019  9:54 AM Patient Name: Asher Babilonia MRN: [id number] Epilepsy Attending: Lora Havens Referring Physician/Provider: Dr Kerney Elbe Date: 07/13/2019 Duration: 22.34 mins Patient history: 85 year old female presenting with awake unresponsive state and  bilateral hand tremors. EEG to evaluate for seizures. Level of alertness: lethargic AEDs during EEG study: Ativan Technical aspects: This EEG study was done with scalp electrodes positioned according to the 10-20 International system of electrode placement. Electrical activity was acquired at a sampling rate of 500Hz  and reviewed with a high frequency filter of 70Hz  and a low frequency filter of 1Hz . EEG data were recorded continuously and digitally stored. DESCRIPTION: No clear posterior dominant rhythm was seen. EEG showed continuous generalized polymorphic 2-3hz  delta slowing admixed with 8-9Hz  alpha activity.Triphasic waves, generalized, maximal bifrontal were also seen. Hyperventilation and photic stimulation were not performed. ABNORMALITY - Continuous slow, generalized - Triphasic waves, generalized IMPRESSION: This study is suggestive of moderate diffuse encephalopathy, non specific to etiology but could be secondary to toxic-metabolic causes. No seizures or epileptiform discharges were seen throughout the recording. Dr Cheral Marker was notified. Priyanka O Yadav   US RENAL  Result Date: 07/15/2019 CLINICAL DATA:  Acute renal failure EXAM: RENAL / URINARY TRACT ULTRASOUND COMPLETE COMPARISON:  None. FINDINGS: Right Kidney: Renal measurements: 10.1 x 5.9 x 5.4 cm = volume: 164.7 mL. Mild hydronephrosis.  Left Kidney: Renal measurements: 9.8 x 7.1 x 6.0 cm = volume: 219.3 mL. Mild hydronephrosis, symmetric to the right. Bladder: Distended bladder. Other: Bilateral pleural effusions. IMPRESSION: 1. Bilateral symmetric mild hydronephrosis. Given the distended bladder, the finding is likely due to obstruction at the level of the pelvis, most likely bladder outlet obstruction. Electronically Signed   By: Dorise Bullion III M.D   On: 07/15/2019 17:37   CT HEAD CODE STROKE WO CONTRAST  Addendum Date: 07/13/2019   ADDENDUM REPORT: 07/13/2019 21:26 ADDENDUM: These results were communicated to Dr. Cheral Marker at 9:13 pm  on 07/13/2019 by text page via the [facility] messaging system. Electronically Signed   By: Logan Bores M.D.   On: 07/13/2019 21:26   Result Date: 07/13/2019 CLINICAL DATA:  Code stroke.  Altered mental status.  Aphasia. EXAM: CT HEAD WITHOUT CONTRAST TECHNIQUE: Contiguous axial images were obtained from the base of the skull through the vertex without intravenous contrast. COMPARISON:  05/28/2018 FINDINGS: Brain: There is no evidence of acute infarct, intracranial hemorrhage, mass, midline shift, or extra-axial fluid collection. The ventricles and sulci are within normal limits for age. Vascular: No hyperdense vessel. Skull: Hyperostosis and dural ossification as previously seen. No acute fracture or destructive osseous lesion. Sinuses/Orbits: The visualized paranasal sinuses and mastoid air cells are clear. Bilateral cataract extraction. Other: None. ASPECTS [facility] Stroke Program Early CT Score) - Ganglionic level infarction (caudate, lentiform nuclei, internal capsule, insula, M1-M3 cortex): 7 - Supraganglionic infarction (M4-M6 cortex): 3 Total score (0-10 with 10 being normal): 10 IMPRESSION: 1. No evidence of acute intracranial abnormality. 2. ASPECTS is 10. Electronically Signed: By: Logan Bores M.D. On: 07/13/2019 21:13    Subjective: Eager to go home  Discharge Exam: Vitals:   07/16/19 2159 07/17/19 0700  BP: 137/65 135/62  Pulse: 65 60  Resp: 16 17  Temp: 98.4 F (36.9 C) 97.8 F (36.6 C)  SpO2: 100% 100%   Vitals:   07/16/19 0845 07/16/19 1611 07/16/19 2159 07/17/19 0700  BP: 135/60 (!) 142/70 137/65 135/62  Pulse: 80 79 65 60  Resp: 18 16 16 17   Temp: 98.5 F (36.9 C) 98.1 F (36.7 C) 98.4 F (36.9 C) 97.8 F (36.6 C)  TempSrc: Oral Oral  Oral  SpO2: 99% 99% 100% 100%  Weight:   54.3 kg   Height:        General: Pt is alert, awake, not in acute distress Cardiovascular: RRR, S1/S2 +, no rubs, no gallops Respiratory: CTA bilaterally, no wheezing, no rhonchi Abdominal:  Soft, NT, ND, bowel sounds + Extremities: no edema, no cyanosis   The results of significant diagnostics from this hospitalization (including imaging, microbiology, ancillary and laboratory) are listed below for reference.     Microbiology: Recent Results (from the past 240 hour(s))  Respiratory Panel by RT PCR (Flu A&B, Covid) - Nasopharyngeal Swab     Status: None   Collection Time: 07/13/19 11:25 PM   Specimen: Nasopharyngeal Swab  Result Value Ref Range Status   SARS Coronavirus 2 by RT PCR NEGATIVE NEGATIVE Final    Comment: (NOTE) SARS-CoV-2 target nucleic acids are NOT DETECTED. The SARS-CoV-2 RNA is generally detectable in upper respiratoy specimens during the acute phase of infection. The lowest concentration of SARS-CoV-2 viral copies this assay can detect is 131 copies/mL. A negative result does not preclude SARS-Cov-2 infection and should not be used as the sole basis for treatment or other patient management decisions. A negative result may occur with  improper specimen collection/handling,  submission of specimen other than nasopharyngeal swab, presence of viral mutation(s) within the areas targeted by this assay, and inadequate number of viral copies (<131 copies/mL). A negative result must be combined with clinical observations, patient history, and epidemiological information. The expected result is Negative. Fact Sheet for Patients:  PinkCheek.be Fact Sheet for Healthcare Providers:  GravelBags.it This test is not yet ap proved or cleared by the [place] FDA and  has been authorized for detection and/or diagnosis of SARS-CoV-2 by FDA under an Emergency Use Authorization (EUA). This EUA will remain  in effect (meaning this test can be used) for the duration of the COVID-19 declaration under Section 564(b)(1) of the Act, 21 U.S.C. section 360bbb-3(b)(1), unless the authorization is terminated or revoked  sooner.    Influenza A by PCR NEGATIVE NEGATIVE Final   Influenza B by PCR NEGATIVE NEGATIVE Final    Comment: (NOTE) The Xpert Xpress SARS-CoV-2/FLU/RSV assay is intended as an aid in  the diagnosis of influenza from Nasopharyngeal swab specimens and  should not be used as a sole basis for treatment. Nasal washings and  aspirates are unacceptable for Xpert Xpress SARS-CoV-2/FLU/RSV  testing. Fact Sheet for Patients: PinkCheek.be Fact Sheet for Healthcare Providers: GravelBags.it This test is not yet approved or cleared by the [place] FDA and  has been authorized for detection and/or diagnosis of SARS-CoV-2 by  FDA under an Emergency Use Authorization (EUA). This EUA will remain  in effect (meaning this test can be used) for the duration of the  Covid-19 declaration under Section 564(b)(1) of the Act, 21  U.S.C. section 360bbb-3(b)(1), unless the authorization is  terminated or revoked. Performed at [facility], [place] [address], [place], [place] [address]      Labs: BNP (last 3 results) No results for input(s): BNP in the last 8760 hours. Basic Metabolic Panel: Recent Labs  Lab 07/13/19 2101 07/13/19 2142 07/14/19 0824 07/14/19 1304 07/14/19 1827 07/14/19 2209 07/15/19 0645 07/16/19 0345 07/17/19 0532  NA   < > 119* 124*   < > 127* 125* 129* 140 141  K   < >  --  4.2   < > 3.5 3.5 3.5 3.2* 3.6  CL   < >  --  90*   < > 95* 93* 98 110 111  CO2   < >  --  21*   < > 22 19* 23 23 24   GLUCOSE   < >  --  86   < > 109* 99 104* 95 91  BUN   < >  --  6*   < > 10 10 12 12 10   CREATININE   < >  --  0.69   < > 0.94 1.24* 1.93* 1.37* 0.99  CALCIUM   < >  --  9.3   < > 8.8* 8.5* 8.6* 8.6* 8.9  MG  --  1.5* 1.9  --   --   --   --   --   --    < > = values in this interval not displayed.   Liver Function Tests: Recent Labs  Lab 07/13/19 2059 07/14/19 0824  AST 27 38  ALT 25 20  ALKPHOS 86 94  BILITOT 1.1  1.7*  PROT 6.8 6.3*  ALBUMIN 3.7 3.7   No results for input(s): LIPASE, AMYLASE in the last 168 hours. Recent Labs  Lab 07/14/19 0824  AMMONIA 40*   CBC: Recent Labs  Lab 07/13/19 2059 07/13/19 2101 07/14/19 0241  WBC 8.8  --  8.3  NEUTROABS 6.9  --  6.8  HGB 11.8* 13.9 9.4*  HCT 35.1* 41.0 28.6*  MCV 84.2  --  84.9  PLT 231  --  172   Cardiac Enzymes: No results for input(s): CKTOTAL, CKMB, CKMBINDEX, TROPONINI in the last 168 hours. BNP: Invalid input(s): POCBNP CBG: Recent Labs  Lab 07/16/19 1215 07/16/19 1610 07/16/19 2200 07/17/19 0652 07/17/19 1126  GLUCAP 91 94 106* 75 102*   D-Dimer No results for input(s): DDIMER in the last 72 hours. Hgb A1c No results for input(s): HGBA1C in the last 72 hours. Lipid Profile No results for input(s): CHOL, HDL, LDLCALC, TRIG, CHOLHDL, LDLDIRECT in the last 72 hours. Thyroid function studies No results for input(s): TSH, T4TOTAL, T3FREE, THYROIDAB in the last 72 hours.  Invalid input(s): FREET3 Anemia work up No results for input(s): VITAMINB12, FOLATE, FERRITIN, TIBC, IRON, RETICCTPCT in the last 72 hours. Urinalysis    Component Value Date/Time   COLORURINE COLORLESS (A) 07/13/2019 2325   APPEARANCEUR CLEAR 07/13/2019 2325   LABSPEC 1.001 (L) 07/13/2019 2325   PHURINE 6.0 07/13/2019 2325   GLUCOSEU NEGATIVE 07/13/2019 2325   HGBUR MODERATE (A) 07/13/2019 2325   BILIRUBINUR NEGATIVE 07/13/2019 2325   KETONESUR NEGATIVE 07/13/2019 2325   PROTEINUR NEGATIVE 07/13/2019 2325   UROBILINOGEN 0.2 06/16/2018 1920   NITRITE NEGATIVE 07/13/2019 2325   LEUKOCYTESUR NEGATIVE 07/13/2019 2325   Sepsis Labs Invalid input(s): PROCALCITONIN,  WBC,  LACTICIDVEN Microbiology Recent Results (from the past 240 hour(s))  Respiratory Panel by RT PCR (Flu A&B, Covid) - Nasopharyngeal Swab     Status: None   Collection Time: 07/13/19 11:25 PM   Specimen: Nasopharyngeal Swab  Result Value Ref Range Status   SARS Coronavirus 2  by RT PCR NEGATIVE NEGATIVE Final    Comment: (NOTE) SARS-CoV-2 target nucleic acids are NOT DETECTED. The SARS-CoV-2 RNA is generally detectable in upper respiratoy specimens during the acute phase of infection. The lowest concentration of SARS-CoV-2 viral copies this assay can detect is 131 copies/mL. A negative result does not preclude SARS-Cov-2 infection and should not be used as the sole basis for treatment or other patient management decisions. A negative result may occur with  improper specimen collection/handling, submission of specimen other than nasopharyngeal swab, presence of viral mutation(s) within the areas targeted by this assay, and inadequate number of viral copies (<131 copies/mL). A negative result must be combined with clinical observations, patient history, and epidemiological information. The expected result is Negative. Fact Sheet for Patients:  PinkCheek.be Fact Sheet for Healthcare Providers:  GravelBags.it This test is not yet ap proved or cleared by the [place] FDA and  has been authorized for detection and/or diagnosis of SARS-CoV-2 by FDA under an Emergency Use Authorization (EUA). This EUA will remain  in effect (meaning this test can be used) for the duration of the COVID-19 declaration under Section 564(b)(1) of the Act, 21 U.S.C. section 360bbb-3(b)(1), unless the authorization is terminated or revoked sooner.    Influenza A by PCR NEGATIVE NEGATIVE Final   Influenza B by PCR NEGATIVE NEGATIVE Final    Comment: (NOTE) The Xpert Xpress SARS-CoV-2/FLU/RSV assay is intended as an aid in  the diagnosis of influenza from Nasopharyngeal swab specimens and  should not be used as a sole basis for treatment. Nasal washings and  aspirates are unacceptable for Xpert Xpress SARS-CoV-2/FLU/RSV  testing. Fact Sheet for Patients: PinkCheek.be Fact Sheet for Healthcare  Providers: GravelBags.it This test is not yet  approved or cleared by the [place] and  has been authorized for detection and/or diagnosis of SARS-CoV-2 by  FDA under an Emergency Use Authorization (EUA). This EUA will remain  in effect (meaning this test can be used) for the duration of the  Covid-19 declaration under Section 564(b)(1) of the Act, 21  U.S.C. section 360bbb-3(b)(1), unless the authorization is  terminated or revoked. Performed at [facility], [place] [address], [place], [place] [address]    Time spent: 30 min  SIGNED:   Marylu Lund, MD  [facility] 07/17/2019, 6:30 PM  If 7PM-7AM, please contact night-coverage

## 2019-07-17 NOTE — Progress Notes (Signed)
Patient voided in commode x 2. First void was 400cc. Second was 200cc. Bladder scanned patient and greater than 460cc noted. Patient stated she did not feel as if she needed to go any more. Will notify MD on rounds.

## 2019-07-25 ENCOUNTER — Other Ambulatory Visit: Payer: Self-pay

## 2019-07-25 ENCOUNTER — Ambulatory Visit (INDEPENDENT_AMBULATORY_CARE_PROVIDER_SITE_OTHER): Payer: Medicare HMO | Admitting: Neurology

## 2019-07-25 ENCOUNTER — Encounter: Payer: Self-pay | Admitting: Neurology

## 2019-07-25 VITALS — BP 148/74 | HR 72 | Temp 98.0°F | Ht 62.0 in | Wt 113.5 lb

## 2019-07-25 DIAGNOSIS — F0390 Unspecified dementia without behavioral disturbance: Secondary | ICD-10-CM | POA: Diagnosis not present

## 2019-07-25 DIAGNOSIS — D649 Anemia, unspecified: Secondary | ICD-10-CM | POA: Diagnosis not present

## 2019-07-25 MED ORDER — ALPRAZOLAM 0.5 MG PO TABS
0.5000 mg | ORAL_TABLET | Freq: Every evening | ORAL | 0 refills | Status: DC | PRN
Start: 2019-07-25 — End: 2020-07-23

## 2019-07-25 MED ORDER — MEMANTINE HCL 10 MG PO TABS: 10.0000 mg | ORAL_TABLET | Freq: Two times a day (BID) | ORAL | 11 refills | Status: DC

## 2019-07-25 NOTE — Progress Notes (Signed)
PATIENT: Donna Butler DOB: Jan 16, 1935  Chief Complaint  Patient presents with  . Dementia    MMSE 11/30 - 5 animals. She is here with her daughter, Donna Butler, to have her memory loss further evaluated.   Marland Kitchen PCP    Donna Panda, MD     HISTORICAL  Donna Butler is a 85 year old female, seen in request by her primary care physician Dr. Jilda Butler for evaluation of memory loss, initial evaluation was on Jul 25, 2019,   I have reviewed and summarized the referring note from the referring physician.  She had a past medical history of hypertension, hyperlipidemia, dementia, being less responsive at home, was admitted to hospital on July 14, 2019, her symptom improved with multiple rounds of IV potassium and IV saline, there was also evidence of hyponatremia initially, that was normalized with IV hydration,  I personally reviewed CT head without contrast on July 13, 2019, no acute abnormality, extensive supratentorium small vessel disease  Laboratory evaluation:BMP showed creatinine 0.99, B12 1220, normal ESR, TSH, CBC showed hemoglobin of 9.4,  EEG showed triphasic waves, continuous generalized slowing,  She was discharged with diagnosis of metabolic encephalopathy, acute renal failure, bladder outlet obstruction, failed voiding trials, was discharged home with Foley catheter, urology follow-up as outpatient,  She graduated from grade school, 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  ALLERGIES: Allergies  Allergen Reactions  . Pantoprazole Other  (See Comments)    Caused lethargy and an overall feeling of "just not feeling well"  . Penicillins Other (See Comments)    Reaction not recalled, but allergic Has patient had a PCN reaction causing immediate rash, facial/tongue/throat swelling, SOB or lightheadedness with hypotension: Unk Has patient had a PCN reaction causing severe rash involving mucus membranes or skin necrosis: No Has patient had a PCN reaction that required hospitalization: No Has patient had a PCN reaction occurring within the last 10 years: No If all of the above answers are "NO", then may proceed with Cephalosporin use.     HOME MEDICATIONS: Current Outpatient Medications  Medication Sig Dispense Refill  . acetaminophen (TYLENOL) 500 MG tablet Take 500-1,000 mg by mouth every 6 (six) hours as needed for mild pain or headache.    Marland Kitchen amLODipine (NORVASC) 5 MG tablet Take 1 tablet (5 mg total) by mouth daily. 30 tablet 0  . aspirin EC 81 MG EC tablet Take 1 tablet (81 mg total) by mouth daily.    . carvedilol (COREG) 12.5 MG tablet Take 1 tablet (12.5 mg total) by mouth 2 (two) times daily with a meal. 60 tablet 0  . lovastatin (MEVACOR) 40 MG tablet Take 80 mg by mouth at bedtime.     . tamsulosin (FLOMAX) 0.4 MG CAPS capsule Take 1 capsule (0.4 mg total) by mouth daily. 30 capsule 0   No current facility-administered medications for this visit.    PAST MEDICAL HISTORY: Past Medical History:  Diagnosis Date  . Dementia ([place])    "had a scan for [Alzheimer's] and they didn't find anything"  . High cholesterol   .  Hypertension   . Hyponatremia 07/14/2019    PAST SURGICAL HISTORY: Past Surgical History:  Procedure Laterality Date  . ABDOMINAL HYSTERECTOMY      FAMILY HISTORY: Family History  Problem Relation Age of Onset  . Diabetes Mother   . Other Father        unsure of history    SOCIAL HISTORY: Social History   Socioeconomic History  . Marital status: Widowed    Spouse name: Not on file  .  Number of children: 3  . Years of education: 52  . Highest education level: High school graduate  Occupational History  . Occupation: retired  Tobacco Use  . Smoking status: Never Smoker  . Smokeless tobacco: Current User    Types: Chew  Substance and Sexual Activity  . Alcohol use: No  . Drug use: No  . Sexual activity: Not on file  Other Topics Concern  . Not on file  Social History Narrative   Lives with her daughter.   Right-handed.   1-2 cups caffeine per day.   Social Determinants of Health   Financial Resource Strain:   . Difficulty of Paying Living Expenses:   Food Insecurity:   . Worried About Charity fundraiser in the Last Year:   . Arboriculturist in the Last Year:   Transportation Needs:   . Film/video editor (Medical):   Marland Kitchen Lack of Transportation (Non-Medical):   Physical Activity:   . Days of Exercise per Week:   . Minutes of Exercise per Session:   Stress:   . Feeling of Stress :   Social Connections:   . Frequency of Communication with Friends and Family:   . Frequency of Social Gatherings with Friends and Family:   . Attends Religious Services:   . Active Member of Clubs or Organizations:   . Attends Archivist Meetings:   Marland Kitchen Marital Status:   Intimate Partner Violence:   . Fear of Current or Ex-Partner:   . Emotionally Abused:   Marland Kitchen Physically Abused:   . Sexually Abused:      PHYSICAL EXAM   Vitals:   07/25/19 1554  BP: (!) 148/74  Pulse: 72  Temp: 98 F (36.7 C)  Weight: 113 lb 8 oz (51.5 kg)  Height: 5' 2"  (1.575 m)    Not recorded      Body mass index is 20.76 kg/m.  PHYSICAL EXAMNIATION:  Gen: NAD, conversant, well nourised, well groomed                     Cardiovascular: Regular rate rhythm, no peripheral edema, warm, nontender. Eyes: Conjunctivae clear without exudates or hemorrhage Neck: Supple, no carotid bruits. Pulmonary: Clear to auscultation bilaterally   NEUROLOGICAL EXAM:  MENTAL STATUS: MMSE  - Mini Mental State Exam 07/25/2019  Orientation to time 0  Orientation to Place 2  Registration 3  Attention/ Calculation 0  Recall 0  Language- name 2 objects 2  Language- repeat 1  Language- follow 3 step command 3  Language- read & follow direction 0  Write a sentence 0  Copy design 0  Total score 11  animal naming 5   CRANIAL NERVES: CN II: Visual fields are full to confrontation. Pupils are round equal and briskly reactive to light. CN III, IV, VI: extraocular movement are normal. No ptosis. CN V: Facial sensation is intact to light touch CN VII: Face is symmetric with normal eye closure  CN VIII: Hearing is  normal to causal conversation. CN IX, X: Phonation is normal. CN XI: Head turning and shoulder shrug are intact  MOTOR: There is no pronator drift of out-stretched arms. Muscle bulk and tone are normal. Muscle strength is normal.  REFLEXES: Reflexes are hypoactive and symmetric at the biceps, triceps, knees, and ankles. Plantar responses are flexor.  SENSORY: Intact to light touch, pinprick and vibratory sensation are intact in fingers and toes.  COORDINATION: There is no trunk or limb dysmetria noted.  GAIT/STANCE: She can get up by pushing on chair arm, mildly unsteady, has Foley catheter in   DIAGNOSTIC DATA (LABS, IMAGING, TESTING) - I reviewed patient records, labs, notes, testing and imaging myself where available.   ASSESSMENT AND PLAN  Donna Butler is a 85 y.o. female   Dementia  CT head of the brain showed no significant atrophy, but evidence of supratentorium small vessel disease  Differentiation diagnosis of her dementia likely central nervous system degenerative disorder with superimposed vascular component,   MRI of the brain would help [place] to better understanding the potential etiology of her dementia  Laboratory evaluation showed no treatable etiology  Starting Namenda 10 mg twice a day  Worsening microcytic anemia  Follow-up with  her primary care physician  Marcial Pacas, M.D. Ph.D.  [facility] [address] [place], [place] [address] Ph: [phone] Fax: [phone]  CC: Donna Panda, MD

## 2019-08-01 ENCOUNTER — Telehealth: Payer: Self-pay | Admitting: Neurology

## 2019-08-01 DIAGNOSIS — R413 Other amnesia: Secondary | ICD-10-CM

## 2019-08-01 NOTE — Telephone Encounter (Signed)
When you get a chance can you put a new MRI order in. That isn't the charm study. Thank you!

## 2019-08-01 NOTE — Telephone Encounter (Signed)
MRI brain WO contrast order placed.

## 2019-08-01 NOTE — Telephone Encounter (Signed)
[employer] medicare order sent to [facility]. They will obtain the auth and reach out to the patient to schedule.

## 2019-08-16 ENCOUNTER — Encounter (HOSPITAL_COMMUNITY): Payer: Self-pay

## 2019-08-16 ENCOUNTER — Other Ambulatory Visit: Payer: Self-pay

## 2019-08-16 ENCOUNTER — Ambulatory Visit (HOSPITAL_COMMUNITY)
Admission: EM | Admit: 2019-08-16 | Discharge: 2019-08-16 | Disposition: A | Discharge status: Home / Self Care | Payer: Medicare HMO | Attending: Family Medicine | Admitting: Family Medicine

## 2019-08-16 DIAGNOSIS — Z8639 Personal history of other endocrine, nutritional and metabolic disease: Secondary | ICD-10-CM

## 2019-08-16 DIAGNOSIS — R350 Frequency of micturition: Secondary | ICD-10-CM | POA: Diagnosis not present

## 2019-08-16 DIAGNOSIS — R1013 Epigastric pain: Secondary | ICD-10-CM | POA: Diagnosis not present

## 2019-08-16 DIAGNOSIS — R109 Unspecified abdominal pain: Secondary | ICD-10-CM

## 2019-08-16 DIAGNOSIS — I10 Essential (primary) hypertension: Secondary | ICD-10-CM

## 2019-08-16 DIAGNOSIS — J3489 Other specified disorders of nose and nasal sinuses: Secondary | ICD-10-CM

## 2019-08-16 LAB — POCT URINALYSIS DIP (DEVICE)
Bilirubin Urine: NEGATIVE
Glucose, UA: NEGATIVE mg/dL
Hgb urine dipstick: NEGATIVE
Ketones, ur: NEGATIVE mg/dL
Nitrite: NEGATIVE
Protein, ur: NEGATIVE mg/dL
Specific Gravity, Urine: <=1.005 (ref 1.005–1.030)
Urobilinogen, UA: 0.2 mg/dL (ref 0.0–1.0)
pH: 6 (ref 5.0–8.0)

## 2019-08-16 NOTE — ED Provider Notes (Signed)
[facility]    CSN: [id number] Arrival date & time: 08/16/19  1630      History   Chief Complaint Chief Complaint  Patient presents with  . Abdominal Pain    HPI Donna Butler is a 85 y.o. female.   85 year old female accompanied by her daughter with concern over mid-epigastric abdominal pain that started this morning. She was doing chores/laundry around the house this morning when around 8am she started experiencing sharp/squeezing upper abdominal pain. She denies any radiation of pain, chest pain, shortness of breath, vision changes, nausea, vomiting or diarrhea. The pain resolved within 1 to 2 hours. She does tend to take her medication without food in the morning and her daughter feels this may have contributed to her symptoms this morning or is a result of eating a "Whopper" from [employer] yesterday evening. She denies any fever but does have slight nasal congestion and drainage. She was started on Zithromax on 5/13 (almost 2 weeks ago) and only took the medication for 1 to 2 days. She also is having more urinary frequency but denies any urinary burning/pain or blood in her urine or bowels. She does have a history of constipation and her usual pattern is a bowel movement every other day. Her last BM was 2 days ago and was normal for her. She occasionally takes a stool softener or laxative as needed. She was recently hospitalized (about 1 month ago) for low sodium levels but otherwise no other complaints today. Has history of HTN, hyperlipidemia, dementia, anemia and Colitis and currently on Norvasc, Coreg, Lovastatin, Flomax and Namenda daily and Xanax and Tylenol prn.    The history is provided by the patient and a caregiver.    Past Medical History:  Diagnosis Date  . Dementia ([place])    "had a scan for [Alzheimer's] and they didn't find anything"  . High cholesterol   . Hypertension   . Hyponatremia 07/14/2019    Patient Active Problem List   Diagnosis Date  Noted  . Dementia without behavioral disturbance ([place]) 07/25/2019  . Anemia 07/25/2019  . Hyponatremia 07/14/2019  . Chest pain 05/20/2019  . Dyslipidemia 05/20/2019  . Dementia with behavioral disturbance ([place]) 05/20/2019  . Bilateral hydronephrosis 05/20/2019  . Tobacco dependence due to chewing tobacco 05/20/2019  . Colitis presumed infectious 07/08/2017  . Hypokalemia 07/08/2017  . Hypertension 07/08/2017  . Colitis 07/08/2017  . Dilation of biliary tract 07/08/2017  . Splenic lesion 07/08/2017    Past Surgical History:  Procedure Laterality Date  . ABDOMINAL HYSTERECTOMY      OB History   No obstetric history on file.      Home Medications    Prior to Admission medications   Medication Sig Start Date End Date Taking? Authorizing Provider  acetaminophen (TYLENOL) 500 MG tablet Take 500-1,000 mg by mouth every 6 (six) hours as needed for mild pain or headache.    [provider]  ALPRAZolam Duanne Moron) 0.5 MG tablet Take 1 tablet (0.5 mg total) by mouth at bedtime as needed for anxiety. 07/25/19   Marcial Pacas, MD  amLODipine (NORVASC) 5 MG tablet Take 1 tablet (5 mg total) by mouth daily. 07/18/19 08/17/19  Donne Hazel, MD  carvedilol (COREG) 12.5 MG tablet Take 1 tablet (12.5 mg total) by mouth 2 (two) times daily with a meal. 07/17/19 08/16/19  Donne Hazel, MD  lovastatin (MEVACOR) 40 MG tablet Take 80 mg by mouth at bedtime.     [provider]  memantine (NAMENDA) 10 MG tablet Take 1 tablet (10 mg total) by mouth 2 (two) times daily. 07/25/19   Marcial Pacas, MD  tamsulosin (FLOMAX) 0.4 MG CAPS capsule Take 1 capsule (0.4 mg total) by mouth daily. 07/18/19 08/17/19  Donne Hazel, MD    Family History Family History  Problem Relation Age of Onset  . Diabetes Mother   . Other Father        unsure of history    Social History Social History   Tobacco Use  . Smoking status: Never Smoker  . Smokeless tobacco: Current User    Types: Chew  Substance  Use Topics  . Alcohol use: No  . Drug use: No     Allergies   Pantoprazole and Penicillins   Review of Systems Review of Systems  Constitutional: Negative for activity change, appetite change, chills, diaphoresis, fatigue and fever.  HENT: Positive for congestion, postnasal drip and rhinorrhea. Negative for ear discharge, ear pain, facial swelling, nosebleeds, sinus pressure, sinus pain, sneezing, sore throat and trouble swallowing.   Eyes: Negative for photophobia and visual disturbance.  Respiratory: Negative for cough, chest tightness, shortness of breath and wheezing.   Cardiovascular: Negative for chest pain and palpitations.  Gastrointestinal: Positive for abdominal pain. Negative for blood in stool, constipation, diarrhea, nausea and vomiting.  Genitourinary: Positive for frequency. Negative for decreased urine volume, difficulty urinating, dysuria, flank pain, hematuria, pelvic pain and urgency.  Musculoskeletal: Negative for back pain, neck pain and neck stiffness.  Skin: Negative for color change, rash and wound.  Allergic/Immunologic: Positive for environmental allergies. Negative for food allergies.  Neurological: Negative for dizziness, tremors, seizures, syncope, facial asymmetry, speech difficulty, weakness, light-headedness, numbness and headaches.  Hematological: Negative for adenopathy. Does not bruise/bleed easily.     Physical Exam Triage Vital Signs ED Triage Vitals  Enc Vitals Group     BP 08/16/19 1650 (!) 157/81     Pulse Rate 08/16/19 1650 71     Resp 08/16/19 1650 20     Temp 08/16/19 1650 98.8 F (37.1 C)     Temp Source 08/16/19 1650 Oral     SpO2 08/16/19 1650 100 %     Weight --      Height --      Head Circumference --      Peak Flow --      Pain Score 08/16/19 1653 6     Pain Loc --      Pain Edu? --      Excl. in [place]? --    No data found.  Updated Vital Signs BP (!) 157/81 (BP Location: Right Arm)   Pulse 71   Temp 98.8 F (37.1 C)  (Oral)   Resp 20   SpO2 100%   Visual Acuity Right Eye Distance:   Left Eye Distance:   Bilateral Distance:    Right Eye Near:   Left Eye Near:    Bilateral Near:     Physical Exam Vitals and nursing note reviewed.  Constitutional:      General: She is awake. She is not in acute distress.    Appearance: She is well-developed and well-groomed. She is not ill-appearing.     Comments: She is sitting comfortably in the exam chair in no acute distress and changes positions easily.   HENT:     Head: Normocephalic and atraumatic.     Right Ear: Hearing, tympanic membrane, ear canal and external ear normal.  Left Ear: Hearing, tympanic membrane, ear canal and external ear normal.     Nose: Rhinorrhea present. Rhinorrhea is clear.     Right Turbinates: Enlarged.     Left Turbinates: Enlarged.     Right Sinus: No maxillary sinus tenderness or frontal sinus tenderness.     Left Sinus: No maxillary sinus tenderness or frontal sinus tenderness.     Mouth/Throat:     Lips: Pink.     Mouth: Mucous membranes are moist.     Pharynx: Uvula midline. Oropharyngeal exudate (slight clear to yellow post nasal drainage present) present. No pharyngeal swelling, posterior oropharyngeal erythema or uvula swelling.  Eyes:     Extraocular Movements: Extraocular movements intact.     Conjunctiva/sclera: Conjunctivae normal.     Pupils: Pupils are equal, round, and reactive to light.  Neck:     Vascular: No carotid bruit.  Cardiovascular:     Rate and Rhythm: Normal rate and regular rhythm.     Heart sounds: Normal heart sounds. No murmur.  Pulmonary:     Effort: Pulmonary effort is normal. No respiratory distress.     Breath sounds: Normal breath sounds and air entry. No decreased air movement. No decreased breath sounds, wheezing, rhonchi or rales.  Abdominal:     General: Abdomen is flat. Bowel sounds are increased. There is no distension or abdominal bruit.     Palpations: Abdomen is soft.  There is no hepatomegaly, splenomegaly or mass.     Tenderness: There is abdominal tenderness in the suprapubic area. There is no right CVA tenderness, left CVA tenderness, guarding or rebound.     Comments: Slight central lower abdominal tenderness.   Musculoskeletal:     Cervical back: Normal range of motion and neck supple. No rigidity or tenderness.  Lymphadenopathy:     Cervical: No cervical adenopathy.  Skin:    General: Skin is warm and dry.     Capillary Refill: Capillary refill takes less than 2 seconds.     Findings: No rash.  Neurological:     General: No focal deficit present.     Mental Status: She is alert and oriented to person, place, and time.     Cranial Nerves: Cranial nerves are intact.     Sensory: Sensation is intact.     Motor: Motor function is intact.     Gait: Gait is intact.  Psychiatric:        Attention and Perception: Attention normal.        Mood and Affect: Mood and affect normal.        Speech: Speech normal.        Behavior: Behavior normal. Behavior is cooperative.      UC Treatments / Results  Labs (all labs ordered are listed, but only abnormal results are displayed) Labs Reviewed  POCT URINALYSIS DIP (DEVICE) - Abnormal; Notable for the following components:      Result Value   Leukocytes,Ua TRACE (*)    All other components within normal limits    EKG   Radiology No results found.  Procedures ED EKG  Date/Time: 08/16/2019 4:51 PM Performed by: Katy Apo, NP Authorized by: Raylene Everts, MD   ECG reviewed by ED Physician in the absence of a cardiologist: yes (Reviewed by Dr. Meda Coffee)   Previous ECG:    Previous ECG:  Compared to current   Similarity:  No change Interpretation:    Interpretation: normal   Rate:    ECG rate:  67   ECG rate assessment: normal   Rhythm:    Rhythm: sinus rhythm   Ectopy:    Ectopy: none   ST segments:    ST segments:  Normal T waves:    T waves: normal   Comments:      Slight voltage changes that may indicate LVH which is similar to previous ECG on 07/13/2019. No ST elevation or depression. No other distinct abnormalities detected.    (including critical care time)  Medications Ordered in UC Medications - No data to display  Initial Impression / Assessment and Plan / UC Course  I have reviewed the triage vital signs and the nursing notes.  Pertinent labs & imaging results that were available during my care of the patient were reviewed by me and considered in my medical decision making (see chart for details).    Reviewed ECG results with patient and daughter- no new changes compared to previous ECG about 1 month ago. Discussed low probability of cardiothoracic etiology to pain, especially since pain has now resolved. Reviewed urinalysis results with patient and daughter- no signs of infection or change in kidney function. Discussed that abdominal pain may have been some gastritis either from irritation/side effects of taking medication on an empty stomach or recent intake of fatty and fried foods ([employer]). Continue to monitor for any return of symptoms. Encouraged to take her medication with food and continue to stay well hydrated. Do not need to restart Zithromax for nasal congestion- recommend take OTC Zyrtec 50m daily as needed. Follow-up with her PCP in 3 to 4 days if nasal congestion does not improve or abdominal pain returns or go to the ER if pain worsens or other symptoms such as chest pain, difficulty breathing, vision changes, nausea, vomiting or dizziness occur.  Final Clinical Impressions(s) / UC Diagnoses   Final diagnoses:  Abdominal pain, epigastric  Urinary frequency  Sinus drainage  H/O electrolyte imbalance     Discharge Instructions     Recommend continue to monitor for any continued stomach pain. Encouraged to eat food before taking her medication in the morning. May take OTC Zyrtec 151mdaily to help with nasal  congestion/drainage. Follow-up with her PCP in 3 to 4 days if symptoms continue or go to the ER if stomach pain returns or gets worse.     ED Prescriptions    None     PDMP not reviewed this encounter.   AmKaty ApoNP 08/17/19 1040

## 2019-08-16 NOTE — Discharge Instructions (Addendum)
Recommend continue to monitor for any continued stomach pain. Encouraged to eat food before taking her medication in the morning. May take OTC Zyrtec 27m daily to help with nasal congestion/drainage. Follow-up with her PCP in 3 to 4 days if symptoms continue or go to the ER if stomach pain returns or gets worse.

## 2019-08-16 NOTE — ED Triage Notes (Signed)
Pt c/o mid-epigastric pain onset this morning at approx 0800. Pt describes pain as squeezing/sharp.  Pt denies any SOB or pain to back/arm, nausea, diaphoresis, changes in vision or other neuro complaint.  Pt states she has had recent urinary frequency, denies burning on urination/urgency, fever, chills. Pt's daughter states that pt's home nurse visited this morning and pt denied abdom/cp at that time. Pt was recently tx/discharged from hospital for hyponatremia per pt's daughter. Smile symmetrical, grips equal/strong, leg strength equal/moderate.  EKG performed, results to Dr. Meda Coffee who advised that pt can return to waiting room and be evaluated further per her time of arrival.

## 2019-08-20 ENCOUNTER — Emergency Department (HOSPITAL_COMMUNITY)
Admission: EM | Admit: 2019-08-20 | Discharge: 2019-08-21 | Discharge status: Against Medical Advice | Payer: Medicare HMO | Attending: Emergency Medicine | Admitting: Emergency Medicine

## 2019-08-20 ENCOUNTER — Emergency Department (HOSPITAL_COMMUNITY): Payer: Medicare HMO

## 2019-08-20 ENCOUNTER — Encounter (HOSPITAL_COMMUNITY): Payer: Self-pay | Admitting: *Deleted

## 2019-08-20 ENCOUNTER — Other Ambulatory Visit: Payer: Self-pay

## 2019-08-20 DIAGNOSIS — R0789 Other chest pain: Secondary | ICD-10-CM | POA: Diagnosis not present

## 2019-08-20 DIAGNOSIS — Z5321 Procedure and treatment not carried out due to patient leaving prior to being seen by health care provider: Secondary | ICD-10-CM | POA: Diagnosis not present

## 2019-08-20 LAB — URINALYSIS, ROUTINE W REFLEX MICROSCOPIC
Bacteria, UA: NONE SEEN
Bilirubin Urine: NEGATIVE
Glucose, UA: NEGATIVE mg/dL
Ketones, ur: NEGATIVE mg/dL
Nitrite: NEGATIVE
Protein, ur: NEGATIVE mg/dL
Specific Gravity, Urine: 1.001 — ABNORMAL LOW (ref 1.005–1.030)
pH: 6 (ref 5.0–8.0)

## 2019-08-20 LAB — BASIC METABOLIC PANEL
Anion gap: 9 (ref 5–15)
BUN: <5 mg/dL — ABNORMAL LOW (ref 8–23)
CO2: 26 mmol/L (ref 22–32)
Calcium: 9.4 mg/dL (ref 8.9–10.3)
Chloride: 98 mmol/L (ref 98–111)
Creatinine, Ser: 0.78 mg/dL (ref 0.44–1.00)
GFR calc Af Amer: >60 mL/min (ref >60)
GFR calc non Af Amer: >60 mL/min (ref >60)
Glucose, Bld: 94 mg/dL (ref 70–99)
Potassium: 3.4 mmol/L — ABNORMAL LOW (ref 3.5–5.1)
Sodium: 133 mmol/L — ABNORMAL LOW (ref 135–145)

## 2019-08-20 LAB — CBC
HCT: 36.8 % (ref 36.0–46.0)
Hemoglobin: 11.7 g/dL — ABNORMAL LOW (ref 12.0–15.0)
MCH: 27.6 pg (ref 26.0–34.0)
MCHC: 31.8 g/dL (ref 30.0–36.0)
MCV: 86.8 fL (ref 80.0–100.0)
Platelets: 229 10*3/uL (ref 150–400)
RBC: 4.24 MIL/uL (ref 3.87–5.11)
RDW: 13.7 % (ref 11.5–15.5)
WBC: 6.4 10*3/uL (ref 4.0–10.5)
nRBC: 0 % (ref 0.0–0.2)

## 2019-08-20 LAB — TROPONIN I (HIGH SENSITIVITY)
Troponin I (High Sensitivity): 2 ng/L (ref <18)
Troponin I (High Sensitivity): 4 ng/L (ref <18)

## 2019-08-20 LAB — LIPASE, BLOOD: Lipase: 31 U/L (ref 11–51)

## 2019-08-20 MED ORDER — SODIUM CHLORIDE 0.9% FLUSH: 3.0000 mL | Freq: Once | INTRAVENOUS | Status: DC

## 2019-08-20 NOTE — ED Triage Notes (Signed)
The pt reports that she has had chest and abd pain for 3-4 days  No nausea or vomitiong

## 2019-08-21 NOTE — ED Notes (Signed)
Unable to locate patient when called for vitals.

## 2019-08-22 ENCOUNTER — Encounter (HOSPITAL_COMMUNITY): Payer: Self-pay

## 2019-08-22 ENCOUNTER — Other Ambulatory Visit: Payer: Self-pay

## 2019-08-22 ENCOUNTER — Emergency Department (HOSPITAL_COMMUNITY)
Admission: EM | Admit: 2019-08-22 | Discharge: 2019-08-22 | Disposition: A | Discharge status: Home / Self Care | Payer: Medicare HMO | Attending: Emergency Medicine | Admitting: Emergency Medicine

## 2019-08-22 DIAGNOSIS — R1013 Epigastric pain: Secondary | ICD-10-CM | POA: Insufficient documentation

## 2019-08-22 DIAGNOSIS — I10 Essential (primary) hypertension: Secondary | ICD-10-CM | POA: Diagnosis not present

## 2019-08-22 DIAGNOSIS — R079 Chest pain, unspecified: Secondary | ICD-10-CM | POA: Insufficient documentation

## 2019-08-22 DIAGNOSIS — F0390 Unspecified dementia without behavioral disturbance: Secondary | ICD-10-CM | POA: Insufficient documentation

## 2019-08-22 DIAGNOSIS — M79602 Pain in left arm: Secondary | ICD-10-CM | POA: Diagnosis not present

## 2019-08-22 DIAGNOSIS — R5383 Other fatigue: Secondary | ICD-10-CM | POA: Diagnosis not present

## 2019-08-22 DIAGNOSIS — Z79899 Other long term (current) drug therapy: Secondary | ICD-10-CM | POA: Insufficient documentation

## 2019-08-22 DIAGNOSIS — F17220 Nicotine dependence, chewing tobacco, uncomplicated: Secondary | ICD-10-CM | POA: Insufficient documentation

## 2019-08-22 DIAGNOSIS — R110 Nausea: Secondary | ICD-10-CM | POA: Diagnosis not present

## 2019-08-22 LAB — CBC
HCT: 38.5 % (ref 36.0–46.0)
Hemoglobin: 12.2 g/dL (ref 12.0–15.0)
MCH: 28 pg (ref 26.0–34.0)
MCHC: 31.7 g/dL (ref 30.0–36.0)
MCV: 88.3 fL (ref 80.0–100.0)
Platelets: 234 10*3/uL (ref 150–400)
RBC: 4.36 MIL/uL (ref 3.87–5.11)
RDW: 14 % (ref 11.5–15.5)
WBC: 7 10*3/uL (ref 4.0–10.5)
nRBC: 0 % (ref 0.0–0.2)

## 2019-08-22 LAB — BASIC METABOLIC PANEL
Anion gap: 10 (ref 5–15)
BUN: <5 mg/dL — ABNORMAL LOW (ref 8–23)
CO2: 29 mmol/L (ref 22–32)
Calcium: 9.8 mg/dL (ref 8.9–10.3)
Chloride: 101 mmol/L (ref 98–111)
Creatinine, Ser: 0.65 mg/dL (ref 0.44–1.00)
GFR calc Af Amer: >60 mL/min (ref >60)
GFR calc non Af Amer: >60 mL/min (ref >60)
Glucose, Bld: 102 mg/dL — ABNORMAL HIGH (ref 70–99)
Potassium: 3.6 mmol/L (ref 3.5–5.1)
Sodium: 140 mmol/L (ref 135–145)

## 2019-08-22 LAB — TROPONIN I (HIGH SENSITIVITY): Troponin I (High Sensitivity): 3 ng/L (ref <18)

## 2019-08-22 MED ORDER — SODIUM CHLORIDE 0.9% FLUSH: 3.0000 mL | Freq: Once | INTRAVENOUS | Status: DC

## 2019-08-22 NOTE — ED Triage Notes (Addendum)
Patient here for ongoing fatigue and left arm pain after having covid vaccine #2 2 weeks ago. Patient seen on 5/29 for vague complaints. States I haven't been well since I got the shot. Complains of intermittent CP and SOB but none today

## 2019-08-22 NOTE — Discharge Instructions (Addendum)
You presented to the ED with some left arm pain, abdominal pain and intermittent chest pain that has since resolved.  This pain has been going on and off since March.  Your lab work including your sodium, potassium and other electrolytes is all within normal limits.  Hemoglobin is within normal limits.  Initial troponin and indication of heart strain was within normal limits.  Follow-up with your PCP and take scheduled Tylenol.

## 2019-08-22 NOTE — ED Notes (Signed)
Patient verbalizes understanding of discharge instructions. Opportunity for questioning and answers were provided. Armband removed by staff, pt discharged from ED via wheelchair to leave with family.

## 2019-08-22 NOTE — ED Provider Notes (Signed)
[facility] EMERGENCY DEPARTMENT Provider Note   CSN: [id number] Arrival date & time: 08/22/19  1325     History Chief Complaint  Patient presents with  . Arm Pain    Donna Butler is a 85 y.o. female.  Patient presenting with left arm pain that radiates to her chest, some vague abdominal pain as well as burping but all symptoms have resolved at time of being bedded in the ED.  Patient notes that all of her symptoms started in March when she got her second Covid vaccine.  Patient denies any heart history.  Patient's daughter at bedside assisting with history.  Patient lives with daughter.  The history is provided by the patient, medical records and a relative.  Illness Location:  Left arm Quality:  Pain Severity:  Mild Onset quality:  Gradual Timing:  Intermittent Progression:  Resolved Chronicity:  Chronic Context:  Patient has had back symptoms since her second COVID-19 vaccine in March. Relieved by:  Time Worsened by:  Nothing Ineffective treatments:  None tried Associated symptoms: abdominal pain, chest pain, fatigue and nausea   Associated symptoms: no congestion, no cough, no diarrhea, no fever, no headaches, no loss of consciousness, no shortness of breath and no vomiting        Past Medical History:  Diagnosis Date  . Dementia ([place])    "had a scan for [Alzheimer's] and they didn't find anything"  . High cholesterol   . Hypertension   . Hyponatremia 07/14/2019    Patient Active Problem List   Diagnosis Date Noted  . Dementia without behavioral disturbance ([place]) 07/25/2019  . Anemia 07/25/2019  . Hyponatremia 07/14/2019  . Chest pain 05/20/2019  . Dyslipidemia 05/20/2019  . Dementia with behavioral disturbance ([place]) 05/20/2019  . Bilateral hydronephrosis 05/20/2019  . Tobacco dependence due to chewing tobacco 05/20/2019  . Colitis presumed infectious 07/08/2017  . Hypokalemia 07/08/2017  . Hypertension 07/08/2017  . Colitis  07/08/2017  . Dilation of biliary tract 07/08/2017  . Splenic lesion 07/08/2017    Past Surgical History:  Procedure Laterality Date  . ABDOMINAL HYSTERECTOMY       OB History   No obstetric history on file.     Family History  Problem Relation Age of Onset  . Diabetes Mother   . Other Father        unsure of history    Social History   Tobacco Use  . Smoking status: Never Smoker  . Smokeless tobacco: Current User    Types: Chew  Substance Use Topics  . Alcohol use: No  . Drug use: No    Home Medications Prior to Admission medications   Medication Sig Start Date End Date Taking? Authorizing Provider  acetaminophen (TYLENOL) 500 MG tablet Take 500-1,000 mg by mouth every 6 (six) hours as needed for mild pain or headache.    [provider]  ALPRAZolam Duanne Moron) 0.5 MG tablet Take 1 tablet (0.5 mg total) by mouth at bedtime as needed for anxiety. 07/25/19   Marcial Pacas, MD  amLODipine (NORVASC) 5 MG tablet Take 1 tablet (5 mg total) by mouth daily. 07/18/19 08/17/19  Donne Hazel, MD  carvedilol (COREG) 12.5 MG tablet Take 1 tablet (12.5 mg total) by mouth 2 (two) times daily with a meal. 07/17/19 08/16/19  Donne Hazel, MD  lovastatin (MEVACOR) 40 MG tablet Take 80 mg by mouth at bedtime.     [provider]  memantine (NAMENDA) 10 MG tablet Take 1 tablet (  10 mg total) by mouth 2 (two) times daily. 07/25/19   Marcial Pacas, MD    Allergies    Pantoprazole and Penicillins  Review of Systems   Review of Systems  Constitutional: Positive for fatigue. Negative for fever.  HENT: Negative for congestion.   Respiratory: Negative for cough and shortness of breath.   Cardiovascular: Positive for chest pain.  Gastrointestinal: Positive for abdominal pain and nausea. Negative for diarrhea and vomiting.  Musculoskeletal: Positive for arthralgias.  Neurological: Negative for loss of consciousness and headaches.  All other systems reviewed and are  negative.   Physical Exam Updated Vital Signs BP (!) 146/91 (BP Location: Right Arm)   Pulse (!) 104   Temp 98.5 F (36.9 C) (Oral)   Resp (!) 22   SpO2 100%   Physical Exam Vitals and nursing note reviewed.  Constitutional:      General: She is not in acute distress.    Appearance: She is well-developed. She is not ill-appearing.  HENT:     Head: Normocephalic and atraumatic.     Right Ear: External ear normal.     Left Ear: External ear normal.     Nose: Nose normal.     Mouth/Throat:     Mouth: Mucous membranes are moist.  Eyes:     Extraocular Movements: Extraocular movements intact.     Conjunctiva/sclera: Conjunctivae normal.     Pupils: Pupils are equal, round, and reactive to light.  Cardiovascular:     Rate and Rhythm: Normal rate and regular rhythm.     Heart sounds: No murmur.  Pulmonary:     Effort: Pulmonary effort is normal. No respiratory distress.     Breath sounds: Normal breath sounds.  Abdominal:     General: There is no distension.     Palpations: Abdomen is soft.     Tenderness: There is no abdominal tenderness. There is no guarding or rebound.  Musculoskeletal:        General: No swelling or tenderness. Normal range of motion.     Cervical back: Neck supple.  Skin:    General: Skin is warm and dry.  Neurological:     General: No focal deficit present.     Mental Status: She is alert and oriented to person, place, and time.  Psychiatric:        Mood and Affect: Mood normal.        Behavior: Behavior normal.     ED Results / Procedures / Treatments   Labs (all labs ordered are listed, but only abnormal results are displayed) Labs Reviewed  BASIC METABOLIC PANEL - Abnormal; Notable for the following components:      Result Value   Glucose, Bld 102 (*)    BUN <5 (*)    All other components within normal limits  CBC  TROPONIN I (HIGH SENSITIVITY)    EKG EKG Interpretation  Date/Time:  Monday Aug 22 2019 13:27:30 EDT Ventricular  Rate:  86 PR Interval:  144 QRS Duration: 94 QT Interval:  374 QTC Calculation: 447 R Axis:   -64 Text Interpretation: Normal sinus rhythm Incomplete right bundle branch block Left anterior fascicular block Septal infarct , age undetermined Abnormal ECG Confirmed by Virgel Manifold [phone]) on 08/22/2019 6:41:15 PM   Radiology No results found.  Procedures Procedures (including critical care time)  Medications Ordered in ED Medications - No data to display  ED Course  I have reviewed the triage vital signs and the nursing notes.  Pertinent  labs & imaging results that were available during my care of the patient were reviewed by me and considered in my medical decision making (see chart for details).    MDM Rules/Calculators/A&P                      Differential diagnosis: Atypical chest pain, ACS, MSK pain of the left arm, GERD chronic pain  ED physician interpretation of EKG: No STEMI.  Normal sinus rhythm ED physician interpretation of labs: BMP, CBC, troponin without critical findings  MDM: Presented to the ED with subacute to chronic symptoms since March including left arm pain that radiates to the chest, intermittent abdominal pain with mild nausea but no vomiting with unremarkable EKG, lab work appropriate for outpatient follow-up and further evaluation by primary care physician.  Patient's vital signs are stable, patient afebrile.  Patient's physical exam is completely unremarkable.  All symptoms have resolved.  No signs of hyponatremia or other electrolyte abnormality, doubt ACS with unremarkable EKG, troponin and atypical symptoms.  Patient has full range motion in the left arm, doubt fracture, dislocation.  Strength intact.  No further emergent medical interventions indicated at this time.  Diagnosis, treatment and plan discussed with patient and family, comfortable discharge home at this time.  Key discharge instructions: You presented to the ED with some left arm  pain, abdominal pain and intermittent chest pain that has since resolved.  This pain has been going on and off since March.  Your lab work including your sodium, potassium and other electrolytes is all within normal limits.  Hemoglobin is within normal limits.  Initial troponin and indication of heart strain was within normal limits.  Follow-up with your PCP and take scheduled Tylenol.   Final Clinical Impression(s) / ED Diagnoses Final diagnoses:  Left arm pain  Epigastric pain    Rx / DC Orders ED Discharge Orders    None       Delma Post, MD 08/23/19 1141    Virgel Manifold, MD 08/23/19 1442

## 2019-09-02 ENCOUNTER — Other Ambulatory Visit: Payer: Medicare HMO

## 2019-09-27 ENCOUNTER — Telehealth: Payer: Self-pay

## 2019-09-27 ENCOUNTER — Other Ambulatory Visit: Payer: Self-pay | Admitting: Internal Medicine

## 2019-09-27 DIAGNOSIS — Z1231 Encounter for screening mammogram for malignant neoplasm of breast: Secondary | ICD-10-CM

## 2019-09-27 MED ORDER — MEMANTINE HCL 10 MG PO TABS: 10.0000 mg | ORAL_TABLET | Freq: Two times a day (BID) | ORAL | 3 refills | Status: DC

## 2019-09-27 NOTE — Addendum Note (Signed)
Addended by: Noberto Retort C on: 09/27/2019 01:27 PM   Modules accepted: Orders

## 2019-09-27 NOTE — Telephone Encounter (Signed)
90-day refills sent to [facility].

## 2019-09-27 NOTE — Telephone Encounter (Signed)
Pts daughter is requesting Memantine 6m be sent through mail order [facility].

## 2019-10-01 ENCOUNTER — Other Ambulatory Visit: Payer: Medicare HMO

## 2019-10-03 NOTE — Telephone Encounter (Signed)
[employer] did not approve the MR.  Based on evicore guidelines. Dementia and preface to the imaging guidelines we cannot approve this request. Your records show that you have a problem that affects your recall (memory). The reason this request cannot be approved is because. Your records show that the same study or a test like it has been done for you. They also show the results of this test. The prior study showed your doctor what they needed to see in order to treat your condition. Repeat imaging is not supported without results of prior imaging that show a clear reason for it."  The test they are referring to is the CT Head wo contrast that she had in April.  There is an option to do a peer to peer. The phone number is [phone] option 4. The case number is [id number]. It does need to be scheduled by Thursday 10/06/19.

## 2019-10-03 NOTE — Telephone Encounter (Addendum)
Please connect me for peer to peer review

## 2019-10-04 NOTE — Telephone Encounter (Signed)
Called to schedule the peer to peer review.  Case # [id number] Peer to peer scheduled with Dr Posey Pronto for 10/05/2019 at 12:45 pm.

## 2019-10-05 NOTE — Telephone Encounter (Signed)
MRI brain [id number]

## 2019-10-05 NOTE — Telephone Encounter (Signed)
Noted, order sent to GI for them to r/s the appt.

## 2019-10-17 NOTE — Telephone Encounter (Signed)
[employer] medicare Donna Butler: [id number] (exp. 10/05/19 to 04/03/19) patient scheduled at GI for 11/02/19.

## 2019-11-02 ENCOUNTER — Other Ambulatory Visit: Payer: Self-pay

## 2019-11-02 ENCOUNTER — Ambulatory Visit
Admission: RE | Admit: 2019-11-02 | Discharge: 2019-11-02 | Disposition: A | Discharge status: Home / Self Care | Payer: Medicare HMO | Source: Ambulatory Visit | Attending: Neurology | Admitting: Neurology

## 2019-11-02 DIAGNOSIS — R413 Other amnesia: Secondary | ICD-10-CM

## 2019-11-04 ENCOUNTER — Ambulatory Visit
Admission: RE | Admit: 2019-11-04 | Discharge: 2019-11-04 | Disposition: A | Discharge status: Home / Self Care | Payer: Medicare HMO | Source: Ambulatory Visit | Attending: Internal Medicine | Admitting: Internal Medicine

## 2019-11-04 ENCOUNTER — Other Ambulatory Visit: Payer: Self-pay

## 2019-11-04 DIAGNOSIS — Z1231 Encounter for screening mammogram for malignant neoplasm of breast: Secondary | ICD-10-CM

## 2019-11-05 ENCOUNTER — Other Ambulatory Visit: Payer: Self-pay | Admitting: Neurology

## 2020-01-25 ENCOUNTER — Ambulatory Visit: Payer: Medicare HMO | Admitting: Neurology

## 2020-01-30 ENCOUNTER — Encounter (HOSPITAL_COMMUNITY): Payer: Self-pay

## 2020-01-30 ENCOUNTER — Other Ambulatory Visit: Payer: Self-pay

## 2020-01-30 ENCOUNTER — Ambulatory Visit (HOSPITAL_COMMUNITY)
Admission: EM | Admit: 2020-01-30 | Discharge: 2020-01-30 | Disposition: A | Discharge status: Home / Self Care | Payer: Medicare HMO | Attending: Internal Medicine | Admitting: Internal Medicine

## 2020-01-30 DIAGNOSIS — Z1152 Encounter for screening for COVID-19: Secondary | ICD-10-CM | POA: Insufficient documentation

## 2020-01-30 DIAGNOSIS — R1084 Generalized abdominal pain: Secondary | ICD-10-CM | POA: Diagnosis not present

## 2020-01-30 DIAGNOSIS — J069 Acute upper respiratory infection, unspecified: Secondary | ICD-10-CM | POA: Diagnosis present

## 2020-01-30 LAB — POCT URINALYSIS DIPSTICK, ED / UC
Bilirubin Urine: NEGATIVE
Glucose, UA: NEGATIVE mg/dL
Ketones, ur: NEGATIVE mg/dL
Leukocytes,Ua: NEGATIVE
Nitrite: NEGATIVE
Protein, ur: NEGATIVE mg/dL
Specific Gravity, Urine: <=1.005 (ref 1.005–1.030)
Urobilinogen, UA: 0.2 mg/dL (ref 0.0–1.0)
pH: 6 (ref 5.0–8.0)

## 2020-01-30 LAB — SARS CORONAVIRUS 2 (TAT 6-24 HRS): SARS Coronavirus 2: NEGATIVE

## 2020-01-30 MED ORDER — FLUTICASONE PROPIONATE 50 MCG/ACT NA SUSP: 1.0000 | Freq: Every day | NASAL | 2 refills | Status: AC

## 2020-01-30 NOTE — ED Triage Notes (Signed)
Pt presents with complaints of lower abdominal pain that started today. Denies dysuria, fever, or diarrhea. Reports normal bowel movement this am. Pt also endorses congestion, runny nose, and cough x 3 days.

## 2020-01-30 NOTE — ED Provider Notes (Signed)
[facility]    CSN: [id number] Arrival date & time: 01/30/20  1646      History   Chief Complaint Chief Complaint  Patient presents with  . Abdominal Pain    HPI Donna Butler is a 85 y.o. female with past medical history of dementia, hypertension presents to urgent care today with her daughter with complaints of abdominal pain. Daughter states her mother was complaining of some discomfort in her stomach this morning and a couple times throughout the day. Patient unable to provide a good history though she told daughter she was feeling fine in route to urgent care and again on evaluation. Daughter also reports cough and runny nose since Saturday. Cough mild, nonproductive and intermittent. Daughter denies any known fever or chills, no diarrhea, vomiting, constipation, SOB or complaints of chest pain. Patient is fully vaccinated against COVID-19. No known sick contacts.    Past Medical History:  Diagnosis Date  . Dementia ([place])    "had a scan for [Alzheimer's] and they didn't find anything"  . High cholesterol   . Hypertension   . Hyponatremia 07/14/2019    Patient Active Problem List   Diagnosis Date Noted  . Dementia without behavioral disturbance ([place]) 07/25/2019  . Anemia 07/25/2019  . Hyponatremia 07/14/2019  . Chest pain 05/20/2019  . Dyslipidemia 05/20/2019  . Dementia with behavioral disturbance ([place]) 05/20/2019  . Bilateral hydronephrosis 05/20/2019  . Tobacco dependence due to chewing tobacco 05/20/2019  . Colitis presumed infectious 07/08/2017  . Hypokalemia 07/08/2017  . Hypertension 07/08/2017  . Colitis 07/08/2017  . Dilation of biliary tract 07/08/2017  . Splenic lesion 07/08/2017    Past Surgical History:  Procedure Laterality Date  . ABDOMINAL HYSTERECTOMY      OB History   No obstetric history on file.      Home Medications    Prior to Admission medications   Medication Sig Start Date End Date Taking? Authorizing  Provider  acetaminophen (TYLENOL) 500 MG tablet Take 500-1,000 mg by mouth every 6 (six) hours as needed for mild pain or headache.    [provider]  ALPRAZolam Duanne Moron) 0.5 MG tablet Take 1 tablet (0.5 mg total) by mouth at bedtime as needed for anxiety. 07/25/19   Marcial Pacas, MD  amLODipine (NORVASC) 5 MG tablet Take 1 tablet (5 mg total) by mouth daily. 07/18/19 08/17/19  Donne Hazel, MD  carvedilol (COREG) 12.5 MG tablet Take 1 tablet (12.5 mg total) by mouth 2 (two) times daily with a meal. 07/17/19 08/16/19  Donne Hazel, MD  fluticasone [facility]) 50 MCG/ACT nasal spray Place 1 spray into both nostrils daily. 01/30/20 01/29/21  Rudolpho Sevin, NP  lovastatin (MEVACOR) 40 MG tablet Take 80 mg by mouth at bedtime.     [provider]  memantine (NAMENDA) 10 MG tablet Take 1 tablet (10 mg total) by mouth 2 (two) times daily. 09/27/19   Marcial Pacas, MD    Family History Family History  Problem Relation Age of Onset  . Diabetes Mother   . Other Father        unsure of history    Social History Social History   Tobacco Use  . Smoking status: Never Smoker  . Smokeless tobacco: Current User    Types: Chew  Vaping Use  . Vaping Use: Never used  Substance Use Topics  . Alcohol use: No  . Drug use: No     Allergies   Pantoprazole and Penicillins   Review  of Systems As stated in HPI otherwise negative   Physical Exam Triage Vital Signs ED Triage Vitals  Enc Vitals Group     BP 01/30/20 1754 (!) 173/79     Pulse Rate 01/30/20 1754 76     Resp 01/30/20 1754 19     Temp 01/30/20 1754 99.6 F (37.6 C)     Temp src --      SpO2 01/30/20 1754 98 %     Weight --      Height --      Head Circumference --      Peak Flow --      Pain Score 01/30/20 1752 6     Pain Loc --      Pain Edu? --      Excl. in [place]? --    No data found.  Updated Vital Signs BP (!) 173/79   Pulse 76   Temp 99.6 F (37.6 C)   Resp 19   SpO2 98%   Visual Acuity Right Eye  Distance:   Left Eye Distance:   Bilateral Distance:    Right Eye Near:   Left Eye Near:    Bilateral Near:     Physical Exam Constitutional:      General: She is not in acute distress.    Appearance: She is well-developed. She is not ill-appearing or toxic-appearing.  HENT:     Mouth/Throat:     Mouth: Mucous membranes are moist.     Pharynx: No oropharyngeal exudate.  Eyes:     General: No scleral icterus.    Extraocular Movements: Extraocular movements intact.  Cardiovascular:     Rate and Rhythm: Normal rate and regular rhythm.  Pulmonary:     Effort: Pulmonary effort is normal. No respiratory distress.     Breath sounds: Normal breath sounds. No wheezing, rhonchi or rales.  Abdominal:     General: Bowel sounds are normal. There is no distension.     Palpations: Abdomen is soft.     Tenderness: There is no abdominal tenderness. There is no right CVA tenderness, left CVA tenderness, guarding or rebound.  Skin:    General: Skin is warm and dry.  Neurological:     General: No focal deficit present.     Mental Status: She is alert.  Psychiatric:        Mood and Affect: Mood normal.     Comments: Pleasantly confused      UC Treatments / Results  Labs (all labs ordered are listed, but only abnormal results are displayed) Labs Reviewed  POCT URINALYSIS DIPSTICK, ED / UC - Abnormal; Notable for the following components:      Result Value   Hgb urine dipstick TRACE (*)    All other components within normal limits  SARS CORONAVIRUS 2 (TAT 6-24 HRS)    EKG   Radiology No results found.  Procedures Procedures (including critical care time)  Medications Ordered in UC Medications - No data to display  Initial Impression / Assessment and Plan / UC Course  I have reviewed the triage vital signs and the nursing notes.  Pertinent labs & imaging results that were available during my care of the patient were reviewed by me and considered in my medical decision making  (see chart for details).  Abdominal discomfort -Now seemingly resolved. VSS, patient nontoxic-appearing and exam is benign -Unfortunately patient is a poor historian given dementia -Consider nausea due to some postnasal drip. Daughter also concerned that patient not  eating enough causing some abdominal pain/cramping  URI -Screen for COVID-19. Patient is fully vaccinated -Isolation precautions discussed -Flonase daily as needed -Return or follow-up with PCP for worsening or persistent symptoms  Reviewed expections re: course of current medical issues. Questions answered. Outlined signs and symptoms indicating need for more acute intervention. Pt verbalized understanding. AVS given   Final Clinical Impressions(s) / UC Diagnoses   Final diagnoses:  Generalized abdominal pain  Acute upper respiratory infection  Encounter for screening for COVID-19     Discharge Instructions     Aside for some nasal congestion Ms. Vinzant's exam looks good today.  I am sending a Covid test just to be cautious.Use Flonase once daily while congested.   COVID testing ordered. It will take between 3-7 days for test results. Someone will contact you regarding abnormal results or you can access your results through [facility].   In the meantime you should... . Remain isolated in your home for 10 days from symptom onset AND greater than 48 hours of no fever without the use of fever-reducing medication . Get plenty of rest and fluids . Flonase for nasal congestion and/or runny nose . You can take OTC Zyrtec-D for nasal congestion, runny nose, and/or sore throat . Use these medications as directed for symptom relief . Use Tylenol or Ibuprofen as needed for fever or pain . Return or go to the ER for any worsening or new symptoms such as high fever, worsening cough, shortness of breath, chest tightness, chest pain, changes in mental status, etc.     ED Prescriptions    Medication Sig Dispense Auth.  Provider   fluticasone (FLONASE) 50 MCG/ACT nasal spray Place 1 spray into both nostrils daily. 31.6 mL Rudolpho Sevin, NP     PDMP not reviewed this encounter.   Rudolpho Sevin, NP 01/30/20 2217

## 2020-01-30 NOTE — Discharge Instructions (Addendum)
Aside for some nasal congestion Ms. Laneve's exam looks good today.  I am sending a Covid test just to be cautious.Use Flonase once daily while congested.   COVID testing ordered. It will take between 3-7 days for test results. Someone will contact you regarding abnormal results or you can access your results through [facility].   In the meantime you should... Remain isolated in your home for 10 days from symptom onset AND greater than 48 hours of no fever without the use of fever-reducing medication Get plenty of rest and fluids Flonase for nasal congestion and/or runny nose You can take OTC Zyrtec-D for nasal congestion, runny nose, and/or sore throat Use these medications as directed for symptom relief Use Tylenol or Ibuprofen as needed for fever or pain Return or go to the ER for any worsening or new symptoms such as high fever, worsening cough, shortness of breath, chest tightness, chest pain, changes in mental status, etc.

## 2020-02-14 ENCOUNTER — Ambulatory Visit (HOSPITAL_COMMUNITY)
Admission: EM | Admit: 2020-02-14 | Discharge: 2020-02-14 | Disposition: A | Discharge status: Home / Self Care | Payer: Medicare HMO | Attending: Family Medicine | Admitting: Family Medicine

## 2020-02-14 ENCOUNTER — Other Ambulatory Visit: Payer: Self-pay

## 2020-02-14 ENCOUNTER — Encounter (HOSPITAL_COMMUNITY): Payer: Self-pay

## 2020-02-14 DIAGNOSIS — R630 Anorexia: Secondary | ICD-10-CM | POA: Diagnosis present

## 2020-02-14 DIAGNOSIS — R350 Frequency of micturition: Secondary | ICD-10-CM | POA: Diagnosis present

## 2020-02-14 DIAGNOSIS — R1013 Epigastric pain: Secondary | ICD-10-CM | POA: Insufficient documentation

## 2020-02-14 LAB — POCT URINALYSIS DIPSTICK, ED / UC
Bilirubin Urine: NEGATIVE
Glucose, UA: NEGATIVE mg/dL
Ketones, ur: NEGATIVE mg/dL
Leukocytes,Ua: NEGATIVE
Nitrite: NEGATIVE
Protein, ur: NEGATIVE mg/dL
Specific Gravity, Urine: <=1.005 (ref 1.005–1.030)
Urobilinogen, UA: 0.2 mg/dL (ref 0.0–1.0)
pH: 6.5 (ref 5.0–8.0)

## 2020-02-14 MED ORDER — FAMOTIDINE 20 MG PO TABS
20.0000 mg | ORAL_TABLET | Freq: Two times a day (BID) | ORAL | 0 refills | Status: DC
Start: 2020-02-14 — End: 2020-07-23

## 2020-02-14 NOTE — Discharge Instructions (Addendum)
Offer fluids and food frequently throughout the day Add Ensure/boost/Carnation instant breakfast daily May add ingredients to the drink to make it more attractive Coffee with every meal.  Decaf in the afternoon and evening Add famotidine two times a day.  This will reduce stomach acid and heartburn See your primary care doctor in follow-up Urine test is negative.  I have sent it for culture

## 2020-02-14 NOTE — ED Provider Notes (Signed)
[place]    CSN: [id number] Arrival date & time: 02/14/20  1736      History   Chief Complaint Chief Complaint  Patient presents with  . Abdominal Pain    mid  . Urinary Frequency  . Nasal Congestion    HPI Donna Butler is a 85 y.o. female.   HPI Daughter is brought patient back in because she still not eating. Still complaining of abdominal pain. She urinates frequently. She eats very little. She has normal bowel movements. No diarrhea. No blood in bowels. Daughter thinks she has lost weight. She has not gone to her PCP. She states that she does not want to eat her meals, instead would rather eat fast food or "sUb". She does like to drink coffee. She does complain of a sour stomach. Has not said she has heartburn, t chest pain, shortness of breath, or belching. She was at one point put on pantoprazole. Did not tolerate this medication. Daughter thinks that when she complains of abdominal pain that she is actually hungry. States that if she has abdominal pain eat something she feels better. Explained to the daughter that this is also true for stomach acid and heartburn. It is difficult to tell in a patient with dementia after having hunger pains or heartburn, both improved with food. She does appear to sleep well. Does not awaken at night with pain. Past Medical History:  Diagnosis Date  . Dementia ([place])    "had a scan for [Alzheimer's] and they didn't find anything"  . High cholesterol   . Hypertension   . Hyponatremia 07/14/2019    Patient Active Problem List   Diagnosis Date Noted  . Dementia without behavioral disturbance ([place]) 07/25/2019  . Anemia 07/25/2019  . Hyponatremia 07/14/2019  . Chest pain 05/20/2019  . Dyslipidemia 05/20/2019  . Dementia with behavioral disturbance ([place]) 05/20/2019  . Bilateral hydronephrosis 05/20/2019  . Tobacco dependence due to chewing tobacco 05/20/2019  . Colitis presumed infectious 07/08/2017  . Hypokalemia  07/08/2017  . Hypertension 07/08/2017  . Colitis 07/08/2017  . Dilation of biliary tract 07/08/2017  . Splenic lesion 07/08/2017    Past Surgical History:  Procedure Laterality Date  . ABDOMINAL HYSTERECTOMY      OB History   No obstetric history on file.      Home Medications    Prior to Admission medications   Medication Sig Start Date End Date Taking? Authorizing Provider  acetaminophen (TYLENOL) 500 MG tablet Take 500-1,000 mg by mouth every 6 (six) hours as needed for mild pain or headache.    [provider]  ALPRAZolam Duanne Moron) 0.5 MG tablet Take 1 tablet (0.5 mg total) by mouth at bedtime as needed for anxiety. 07/25/19   Marcial Pacas, MD  amLODipine (NORVASC) 5 MG tablet Take 1 tablet (5 mg total) by mouth daily. 07/18/19 08/17/19  Donne Hazel, MD  carvedilol (COREG) 12.5 MG tablet Take 1 tablet (12.5 mg total) by mouth 2 (two) times daily with a meal. 07/17/19 08/16/19  Donne Hazel, MD  famotidine (PEPCID) 20 MG tablet Take 1 tablet (20 mg total) by mouth 2 (two) times daily. 02/14/20   Raylene Everts, MD  fluticasone [facility]) 50 MCG/ACT nasal spray Place 1 spray into both nostrils daily. 01/30/20 01/29/21  Rudolpho Sevin, NP  lovastatin (MEVACOR) 40 MG tablet Take 80 mg by mouth at bedtime.     [provider]  memantine (NAMENDA) 10 MG tablet Take 1 tablet (10  mg total) by mouth 2 (two) times daily. 09/27/19   Marcial Pacas, MD    Family History Family History  Problem Relation Age of Onset  . Diabetes Mother   . Other Father        unsure of history    Social History Social History   Tobacco Use  . Smoking status: Never Smoker  . Smokeless tobacco: Current User    Types: Chew  Vaping Use  . Vaping Use: Never used  Substance Use Topics  . Alcohol use: No  . Drug use: No     Allergies   Pantoprazole and Penicillins   Review of Systems Review of Systems See HPI  Physical Exam Triage Vital Signs ED Triage Vitals  Enc Vitals  Group     BP 02/14/20 1853 (!) 155/84     Pulse Rate 02/14/20 1853 70     Resp 02/14/20 1853 18     Temp 02/14/20 1853 97.6 F (36.4 C)     Temp Source 02/14/20 1853 Oral     SpO2 02/14/20 1853 98 %     Weight --      Height --      Head Circumference --      Peak Flow --      Pain Score 02/14/20 1849 8     Pain Loc --      Pain Edu? --      Excl. in [place]? --    No data found.  Updated Vital Signs BP (!) 155/84 (BP Location: Left Arm)   Pulse 70   Temp 97.6 F (36.4 C) (Oral)   Resp 18   SpO2 98%  125.4 pounds  Her last weight 5 months ago was 113 pounds. Patient does have bilateral pedal edema, hard to tell how much of this increased weight might be fluid    Physical Exam Constitutional:      General: She is not in acute distress.    Appearance: She is well-developed.     Comments: Appears frail. Small steps. Stooped posture  HENT:     Head: Normocephalic and atraumatic.     Mouth/Throat:     Mouth: Mucous membranes are dry.  Eyes:     Conjunctiva/sclera: Conjunctivae normal.     Pupils: Pupils are equal, round, and reactive to light.  Cardiovascular:     Rate and Rhythm: Normal rate and regular rhythm.     Heart sounds: Normal heart sounds.  Pulmonary:     Effort: Pulmonary effort is normal. No respiratory distress.     Breath sounds: Normal breath sounds. No wheezing or rales.  Abdominal:     General: There is no distension.     Palpations: Abdomen is soft.     Tenderness: There is abdominal tenderness.     Comments: Mild tenderness in the epigastrium. No guarding or rebound. No organomegaly. No palpable mass  Musculoskeletal:        General: Normal range of motion.     Cervical back: Normal range of motion.     Right lower leg: Edema present.     Left lower leg: Edema present.  Skin:    General: Skin is warm and dry.  Neurological:     Mental Status: She is alert.  Psychiatric:        Behavior: Behavior normal.     Comments: Slow to answer questions.  Daughter answers most questions for her. Patient is agreeable but not informative      UC  Treatments / Results  Labs (all labs ordered are listed, but only abnormal results are displayed) Labs Reviewed  POCT URINALYSIS DIPSTICK, ED / UC - Abnormal; Notable for the following components:      Result Value   Hgb urine dipstick TRACE (*)    All other components within normal limits  URINE CULTURE    EKG   Radiology No results found.  Procedures Procedures (including critical care time)  Medications Ordered in UC Medications - No data to display  Initial Impression / Assessment and Plan / UC Course  I have reviewed the triage vital signs and the nursing notes.  Pertinent labs & imaging results that were available during my care of the patient were reviewed by me and considered in my medical decision making (see chart for details).     Because of a complaint of urinary frequency we will culture her urine. It does not look like she has a urinary tract infection. Because of her poor appetite and possibility of GERD and get a put her on famotidine. I am not going to use a PPI because of her reaction to pantoprazole. I did have a discussion with daughter about how to increase her calories. Using boost/Ensure/Carnation Instant Breakfast. Adding powdered milk to food to get more protein. Frequent small meals. Coffee with every meal She may have pedal edema because of her amlodipine. She really needs a primary care follow-up. Final Clinical Impressions(s) / UC Diagnoses   Final diagnoses:  Urinary frequency  Epigastric pain  Poor appetite     Discharge Instructions     Offer fluids and food frequently throughout the day Add Ensure/boost/Carnation instant breakfast daily May add ingredients to the drink to make it more attractive Coffee with every meal.  Decaf in the afternoon and evening Add famotidine two times a day.  This will reduce stomach acid and heartburn See your  primary care doctor in follow-up Urine test is negative.  I have sent it for culture    ED Prescriptions    Medication Sig Dispense Auth. Provider   famotidine (PEPCID) 20 MG tablet Take 1 tablet (20 mg total) by mouth 2 (two) times daily. 60 tablet Raylene Everts, MD     PDMP not reviewed this encounter.   Raylene Everts, MD 02/14/20 2007

## 2020-02-14 NOTE — ED Triage Notes (Signed)
Pt presents with throbbing stomach pain, frequency x 1 day. Pt states she has been able to have normal bowel movements. Pt daughter states that she has not been eating well, she states she has been having decreased appetite. Pt daughter states when she eat she feels better. Pt daughter states she has had nasal congestion x 2 weeks. Pt denies lower back pain, and  Lower abdominal pain.

## 2020-02-17 ENCOUNTER — Telehealth (HOSPITAL_COMMUNITY): Payer: Self-pay | Admitting: Emergency Medicine

## 2020-02-17 LAB — URINE CULTURE
Culture: 30000 — AB
Special Requests: NORMAL

## 2020-02-17 MED ORDER — SULFAMETHOXAZOLE-TRIMETHOPRIM 800-160 MG PO TABS
1.0000 | ORAL_TABLET | Freq: Two times a day (BID) | ORAL | 0 refills | Status: AC
Start: 2020-02-17 — End: 2020-02-20

## 2020-04-25 ENCOUNTER — Ambulatory Visit: Payer: Medicare HMO | Admitting: Neurology

## 2020-06-14 ENCOUNTER — Emergency Department (HOSPITAL_BASED_OUTPATIENT_CLINIC_OR_DEPARTMENT_OTHER): Payer: Medicare HMO

## 2020-06-14 ENCOUNTER — Emergency Department (HOSPITAL_BASED_OUTPATIENT_CLINIC_OR_DEPARTMENT_OTHER)
Admission: EM | Admit: 2020-06-14 | Discharge: 2020-06-15 | Disposition: A | Discharge status: Home / Self Care | Payer: Medicare HMO | Attending: Emergency Medicine | Admitting: Emergency Medicine

## 2020-06-14 ENCOUNTER — Encounter (HOSPITAL_BASED_OUTPATIENT_CLINIC_OR_DEPARTMENT_OTHER): Payer: Self-pay

## 2020-06-14 ENCOUNTER — Other Ambulatory Visit: Payer: Self-pay

## 2020-06-14 DIAGNOSIS — R1030 Lower abdominal pain, unspecified: Secondary | ICD-10-CM | POA: Insufficient documentation

## 2020-06-14 DIAGNOSIS — F0390 Unspecified dementia without behavioral disturbance: Secondary | ICD-10-CM | POA: Insufficient documentation

## 2020-06-14 DIAGNOSIS — Z79899 Other long term (current) drug therapy: Secondary | ICD-10-CM | POA: Diagnosis not present

## 2020-06-14 DIAGNOSIS — E871 Hypo-osmolality and hyponatremia: Secondary | ICD-10-CM | POA: Diagnosis not present

## 2020-06-14 DIAGNOSIS — I10 Essential (primary) hypertension: Secondary | ICD-10-CM | POA: Insufficient documentation

## 2020-06-14 DIAGNOSIS — R350 Frequency of micturition: Secondary | ICD-10-CM | POA: Diagnosis not present

## 2020-06-14 DIAGNOSIS — N320 Bladder-neck obstruction: Secondary | ICD-10-CM

## 2020-06-14 DIAGNOSIS — F17220 Nicotine dependence, chewing tobacco, uncomplicated: Secondary | ICD-10-CM | POA: Insufficient documentation

## 2020-06-14 DIAGNOSIS — E876 Hypokalemia: Secondary | ICD-10-CM | POA: Diagnosis not present

## 2020-06-14 LAB — COMPREHENSIVE METABOLIC PANEL
ALT: 10 U/L (ref 0–44)
AST: 16 U/L (ref 15–41)
Albumin: 4.1 g/dL (ref 3.5–5.0)
Alkaline Phosphatase: 90 U/L (ref 38–126)
Anion gap: 11 (ref 5–15)
BUN: 6 mg/dL — ABNORMAL LOW (ref 8–23)
CO2: 25 mmol/L (ref 22–32)
Calcium: 8.9 mg/dL (ref 8.9–10.3)
Chloride: 92 mmol/L — ABNORMAL LOW (ref 98–111)
Creatinine, Ser: 0.67 mg/dL (ref 0.44–1.00)
GFR, Estimated: >60 mL/min (ref >60)
Glucose, Bld: 105 mg/dL — ABNORMAL HIGH (ref 70–99)
Potassium: 2.8 mmol/L — ABNORMAL LOW (ref 3.5–5.1)
Sodium: 128 mmol/L — ABNORMAL LOW (ref 135–145)
Total Bilirubin: 0.6 mg/dL (ref 0.3–1.2)
Total Protein: 7 g/dL (ref 6.5–8.1)

## 2020-06-14 LAB — URINALYSIS, ROUTINE W REFLEX MICROSCOPIC
Bilirubin Urine: NEGATIVE
Glucose, UA: NEGATIVE mg/dL
Hgb urine dipstick: NEGATIVE
Ketones, ur: NEGATIVE mg/dL
Leukocytes,Ua: NEGATIVE
Nitrite: NEGATIVE
Protein, ur: NEGATIVE mg/dL
Specific Gravity, Urine: 1 — ABNORMAL LOW (ref 1.005–1.030)
pH: 6.5 (ref 5.0–8.0)

## 2020-06-14 LAB — CBC WITH DIFFERENTIAL/PLATELET
Abs Immature Granulocytes: 0.02 10*3/uL (ref 0.00–0.07)
Basophils Absolute: 0 10*3/uL (ref 0.0–0.1)
Basophils Relative: 0 %
Eosinophils Absolute: 0.1 10*3/uL (ref 0.0–0.5)
Eosinophils Relative: 1 %
HCT: 36.8 % (ref 36.0–46.0)
Hemoglobin: 12.1 g/dL (ref 12.0–15.0)
Immature Granulocytes: 0 %
Lymphocytes Relative: 15 %
Lymphs Abs: 1.2 10*3/uL (ref 0.7–4.0)
MCH: 27.1 pg (ref 26.0–34.0)
MCHC: 32.9 g/dL (ref 30.0–36.0)
MCV: 82.5 fL (ref 80.0–100.0)
Monocytes Absolute: 0.8 10*3/uL (ref 0.1–1.0)
Monocytes Relative: 10 %
Neutro Abs: 5.7 10*3/uL (ref 1.7–7.7)
Neutrophils Relative %: 74 %
Platelets: 208 10*3/uL (ref 150–400)
RBC: 4.46 MIL/uL (ref 3.87–5.11)
RDW: 13 % (ref 11.5–15.5)
WBC: 7.7 10*3/uL (ref 4.0–10.5)
nRBC: 0 % (ref 0.0–0.2)

## 2020-06-14 LAB — LIPASE, BLOOD: Lipase: 22 U/L (ref 11–51)

## 2020-06-14 MED ORDER — POTASSIUM CHLORIDE CRYS ER 20 MEQ PO TBCR
40.0000 meq | EXTENDED_RELEASE_TABLET | Freq: Once | ORAL | Status: AC
  Administered 2020-06-14: 40 meq via ORAL
  Filled 2020-06-14: qty 2

## 2020-06-14 MED ORDER — POTASSIUM CHLORIDE ER 10 MEQ PO TBCR
10.0000 meq | EXTENDED_RELEASE_TABLET | Freq: Every day | ORAL | 0 refills | Status: DC
Start: 2020-06-14 — End: 2020-07-23

## 2020-06-14 MED ORDER — SODIUM CHLORIDE 0.9 % IV BOLUS
500.0000 mL | Freq: Once | INTRAVENOUS | Status: AC
  Administered 2020-06-14: 500 mL via INTRAVENOUS

## 2020-06-14 MED ORDER — IOHEXOL 300 MG/ML  SOLN
100.0000 mL | Freq: Once | INTRAMUSCULAR | Status: AC | PRN
  Administered 2020-06-14: 100 mL via INTRAVENOUS

## 2020-06-14 NOTE — ED Notes (Signed)
ED Provider at bedside. 

## 2020-06-14 NOTE — ED Provider Notes (Addendum)
[place] EMERGENCY DEPT Provider Note   CSN: [id number] Arrival date & time: 06/14/20  1821     History Chief Complaint  Patient presents with  . Abdominal Pain    Donna Butler is a 86 y.o. female.  HPI   86 year old female with past medical history of HTN, HLD, dementia presents the emergency department accompanied by her daughter for concern of lower abdominal discomfort and urinary frequency.  Patient states that this started yesterday.  She feels like she is having to use the restroom more frequent than normal.  Denies any specific dysuria or hematuria.  She states the lower abdominal pain is very mild, like a soreness.  Denies any diarrhea.  She did have one episode of vomiting earlier today.  Denies any fever, chest pain, shortness of breath, diarrhea.  Past Medical History:  Diagnosis Date  . Dementia ([place])    "had a scan for [Alzheimer's] and they didn't find anything"  . High cholesterol   . Hypertension   . Hyponatremia 07/14/2019    Patient Active Problem List   Diagnosis Date Noted  . Dementia without behavioral disturbance ([place]) 07/25/2019  . Anemia 07/25/2019  . Hyponatremia 07/14/2019  . Chest pain 05/20/2019  . Dyslipidemia 05/20/2019  . Dementia with behavioral disturbance ([place]) 05/20/2019  . Bilateral hydronephrosis 05/20/2019  . Tobacco dependence due to chewing tobacco 05/20/2019  . Colitis presumed infectious 07/08/2017  . Hypokalemia 07/08/2017  . Hypertension 07/08/2017  . Colitis 07/08/2017  . Dilation of biliary tract 07/08/2017  . Splenic lesion 07/08/2017    Past Surgical History:  Procedure Laterality Date  . ABDOMINAL HYSTERECTOMY       OB History   No obstetric history on file.     Family History  Problem Relation Age of Onset  . Diabetes Mother   . Other Father        unsure of history    Social History   Tobacco Use  . Smoking status: Never Smoker  . Smokeless tobacco: Current User    Types:  Chew, Snuff  Vaping Use  . Vaping Use: Never used  Substance Use Topics  . Alcohol use: No  . Drug use: No    Home Medications Prior to Admission medications   Medication Sig Start Date End Date Taking? Authorizing Provider  amLODipine (NORVASC) 5 MG tablet Take 1 tablet (5 mg total) by mouth daily. 07/18/19 08/17/19 Yes Donne Hazel, MD  carvedilol (COREG) 12.5 MG tablet Take 1 tablet (12.5 mg total) by mouth 2 (two) times daily with a meal. 07/17/19 08/16/19 Yes Donne Hazel, MD  lovastatin (MEVACOR) 40 MG tablet Take 80 mg by mouth at bedtime.    Yes [provider]  memantine (NAMENDA) 10 MG tablet Take 1 tablet (10 mg total) by mouth 2 (two) times daily. 09/27/19  Yes Marcial Pacas, MD  acetaminophen (TYLENOL) 500 MG tablet Take 500-1,000 mg by mouth every 6 (six) hours as needed for mild pain or headache.    [provider]  ALPRAZolam Duanne Moron) 0.5 MG tablet Take 1 tablet (0.5 mg total) by mouth at bedtime as needed for anxiety. 07/25/19   Marcial Pacas, MD  famotidine (PEPCID) 20 MG tablet Take 1 tablet (20 mg total) by mouth 2 (two) times daily. 02/14/20   Raylene Everts, MD  fluticasone [facility]) 50 MCG/ACT nasal spray Place 1 spray into both nostrils daily. 01/30/20 01/29/21  Rudolpho Sevin, NP    Allergies    Pantoprazole and  Penicillins  Review of Systems   Review of Systems  Constitutional: Negative for chills and fever.  HENT: Negative for congestion.   Eyes: Negative for visual disturbance.  Respiratory: Negative for shortness of breath.   Cardiovascular: Negative for chest pain.  Gastrointestinal: Positive for abdominal pain and vomiting. Negative for diarrhea.  Genitourinary: Positive for frequency. Negative for difficulty urinating, dysuria, flank pain and hematuria.  Skin: Negative for rash.  Neurological: Negative for headaches.    Physical Exam Updated Vital Signs BP (!) 165/80 (BP Location: Right Arm)   Pulse 79   Temp 98.9 F (37.2 C) (Oral)    Resp 16   Ht 5' 2"  (1.575 m)   Wt 53.5 kg   SpO2 99%   BMI 21.58 kg/m   Physical Exam Vitals and nursing note reviewed.  Constitutional:      Appearance: Normal appearance.  HENT:     Head: Normocephalic.     Mouth/Throat:     Mouth: Mucous membranes are moist.  Cardiovascular:     Rate and Rhythm: Normal rate.  Pulmonary:     Effort: Pulmonary effort is normal. No respiratory distress.  Abdominal:     General: There is no distension.     Palpations: Abdomen is soft.     Tenderness: There is no abdominal tenderness.     Hernia: No hernia is present.  Skin:    General: Skin is warm.  Neurological:     Mental Status: She is alert and oriented to person, place, and time. Mental status is at baseline.  Psychiatric:        Mood and Affect: Mood normal.     ED Results / Procedures / Treatments   Labs (all labs ordered are listed, but only abnormal results are displayed) Labs Reviewed  URINALYSIS, ROUTINE W REFLEX MICROSCOPIC - Abnormal; Notable for the following components:      Result Value   Color, Urine COLORLESS (*)    Specific Gravity, Urine 1.000 (*)    All other components within normal limits  CBC WITH DIFFERENTIAL/PLATELET  COMPREHENSIVE METABOLIC PANEL  LIPASE, BLOOD    EKG None  Radiology No results found.  Procedures Procedures   Medications Ordered in ED Medications - No data to display  ED Course  I have reviewed the triage vital signs and the nursing notes.  Pertinent labs & imaging results that were available during my care of the patient were reviewed by me and considered in my medical decision making (see chart for details).    MDM Rules/Calculators/A&P                          86 year old female presents the emergency department with lower abdominal discomfort, urinary frequency and one episode of vomiting.  Vitals are stable on arrival, abdominal exam is benign.  EKG shows no acute changes from previous. Blood work shows mild  hyponatremia and hypokalemia.  Patient was hydrated and given replacement.  Patient continues to deny any diarrhea.  She still having frequent urination.  CAT scan was done, shows questionable wall thickening around the sigmoid colon as well as dilated ureters and over distended bladder, suggestive of bladder outlet obstruction.  Patient has had similar findings before, a couple times dating back to 2019.  Most recently she was admitted in April/2021.  At that time she was found to be hyponatremic with bladder outlet obstruction, her ACE inhibitor was stopped and she was sent home with a Foley  to follow-up with urology.  The symptoms resolved.  She appears to be having a similar episode.  The hyponatremia hypokalemia is not acute enough to warrant emergent admission, she has been able to p.o.  No signs of polydipsia.  I think this may be hormonal or adrenal gland related.  Although patient is able to pass urine her bladder continues to be full, will place Foley, send additional lab work and patient and daughter will call her primary doctor and urologist tomorrow for reevaluation.  Educated them on symptoms of hyponatremia.  Patient will be discharged and treated as an outpatient.  Discharge plan and strict return to ED precautions discussed, patient verbalizes understanding and agreement.  Final Clinical Impression(s) / ED Diagnoses Final diagnoses:  None    Rx / DC Orders ED Discharge Orders    None       Lorelle Gibbs, DO 06/14/20 2339    Lorelle Gibbs, DO 07/10/20 1549

## 2020-06-14 NOTE — ED Triage Notes (Signed)
Patient presents to the ED with complaints of lower abdominal pain since yesterday. Patient reports she has been having frequent urination; denies nausea or diarrhea. Did have one episode of emesis per patient earlier today.

## 2020-06-14 NOTE — ED Notes (Signed)
Patients Bed soiled from urine due to excessive output. Stretcher cleaned and linens changed. Patient provided with clean mesh undergarments and Purewick removed. Patient stated she would like to sit on bedside commode. RN Debara Pickett made aware.

## 2020-06-14 NOTE — ED Notes (Signed)
Patient returned from Radiology. 

## 2020-06-14 NOTE — Discharge Instructions (Addendum)
You have been seen and discharged from the emergency department.  You were found to have bladder outlet obstruction, a Foley was placed for this reason.  Your sodium and potassium were slightly low.  You were given replacement in the department.  Take supplements that were prescribed for the next couple days. You need to follow-up with your primary doctor and urology tomorrow for repeat blood work and Foley removal.  Additional lab work was sent today that will result in the next couple days for further evaluation of this cause. Have your primary follow this up.Take home medications as prescribed. If you have any worsening symptoms or further concerns for health please return to an emergency department for further evaluation.

## 2020-06-14 NOTE — ED Notes (Signed)
Pt went to the bathroom  Ambulatory with assisstance

## 2020-06-15 LAB — CORTISOL: Cortisol, Plasma: 14.5 ug/dL

## 2020-06-15 LAB — SODIUM, URINE, RANDOM: Sodium, Ur: 14 mmol/L

## 2020-06-15 LAB — OSMOLALITY, URINE: Osmolality, Ur: 48 mOsm/kg — ABNORMAL LOW (ref 300–900)

## 2020-06-15 LAB — OSMOLALITY: Osmolality: 298 mOsm/kg — ABNORMAL HIGH (ref 275–295)

## 2020-06-15 NOTE — ED Notes (Signed)
Pt dc with Foley in on leg bag. Dc instructions reviewed

## 2020-06-16 LAB — ACTH: C206 ACTH: 12.4 pg/mL (ref 7.2–63.3)

## 2020-07-12 ENCOUNTER — Ambulatory Visit: Payer: Medicare HMO | Admitting: Neurology

## 2020-07-23 ENCOUNTER — Encounter: Payer: Self-pay | Admitting: Neurology

## 2020-07-23 ENCOUNTER — Ambulatory Visit: Payer: Medicare HMO | Admitting: Neurology

## 2020-07-23 VITALS — BP 142/65 | HR 77 | Ht 62.0 in | Wt 119.0 lb

## 2020-07-23 DIAGNOSIS — F0390 Unspecified dementia without behavioral disturbance: Secondary | ICD-10-CM

## 2020-07-23 MED ORDER — MEMANTINE HCL 10 MG PO TABS: 10.0000 mg | ORAL_TABLET | Freq: Two times a day (BID) | ORAL | 3 refills | Status: DC

## 2020-07-23 NOTE — Progress Notes (Signed)
PATIENT: Donna Butler DOB: Feb 06, 1935  Chief Complaint  Patient presents with  . Follow-up    New rm, with daughter, reports no changes, mri was not completed states pt would not stay still     HISTORICAL  Donna Butler is a 86 year old female, seen in request by her primary care physician Dr. Jilda Panda for evaluation of memory loss, initial evaluation was on Jul 25, 2019,   I have reviewed and summarized the referring note from the referring physician.  She had a past medical history of hypertension, hyperlipidemia, dementia, being less responsive at home, was admitted to hospital on July 14, 2019, her symptom improved with multiple rounds of IV potassium and IV saline, there was also evidence of hyponatremia initially, that was normalized with IV hydration,  I personally reviewed CT head without contrast on July 13, 2019, no acute abnormality, extensive supratentorium small vessel disease  Laboratory evaluation:BMP showed creatinine 0.99, B12 1220, normal ESR, TSH, CBC showed hemoglobin of 9.4,  EEG showed triphasic waves, continuous generalized slowing,  She was discharged with diagnosis of metabolic encephalopathy, acute renal failure, bladder outlet obstruction, failed voiding trials, was discharged home with Foley catheter, urology follow-up as outpatient,  She graduated from grade school, 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 She lives with her daughter. At last visit brain was ordered, but couldn't be completed, she wouldn't stay still. She thinks memory is doing well, she does cleaning, cooking sometimes. Not driving.  Daughter manages medications. Sleeps well, good appetite. Enjoys watching TV. No falls. She repeats herself, forget the days. Generally mood is good. Remains on Namenda 10 mg twice daily. Couldn't tell me her DOB, or date. Knew her and daughter's name.   REVIEW OF SYSTEMS: Full 14 system review of systems performed and notable only for as above  Memory loss  ALLERGIES: Allergies  Allergen Reactions  . Pantoprazole Other (See Comments)    Caused lethargy and an overall feeling of "just not feeling well"  . Penicillins Other (See Comments)    Reaction not recalled, but allergic Has patient had a PCN reaction causing immediate rash, facial/tongue/throat swelling, SOB or lightheadedness with hypotension: Unk Has patient had a PCN reaction causing severe rash involving mucus membranes or skin necrosis: No Has patient had a PCN reaction that required hospitalization: No Has patient had a PCN reaction occurring within the last 10 years: No If all of the above answers are "NO", then may proceed with Cephalosporin use.     HOME MEDICATIONS: Current Outpatient Medications  Medication Sig Dispense Refill  . acetaminophen (TYLENOL) 500 MG tablet Take 500-1,000 mg by mouth every 6 (six) hours as needed for mild pain or headache.    . fluticasone (FLONASE) 50 MCG/ACT nasal spray Place 1 spray into both nostrils daily. 31.6 mL 2  . lovastatin (MEVACOR) 40 MG tablet Take 80 mg by mouth at bedtime.     . memantine (NAMENDA) 10 MG tablet Take 1 tablet (10 mg total) by mouth 2 (two) times daily. 180 tablet 3  . amLODipine (NORVASC) 5 MG tablet Take 1 tablet (5 mg total) by mouth daily. 30 tablet  0  . carvedilol (COREG) 12.5 MG tablet Take 1 tablet (12.5 mg total) by mouth 2 (two) times daily with a meal. 60 tablet 0   No current facility-administered medications for this visit.    PAST MEDICAL HISTORY: Past Medical History:  Diagnosis Date  . Dementia ([place])    "had a scan for [Alzheimer's] and  they didn't find anything"  . High cholesterol   . Hypertension   . Hyponatremia 07/14/2019    PAST SURGICAL HISTORY: Past Surgical History:  Procedure Laterality Date  . ABDOMINAL HYSTERECTOMY      FAMILY HISTORY: Family History  Problem Relation Age of Onset  . Diabetes Mother   . Other Father        unsure of history    SOCIAL HISTORY: Social History   Socioeconomic History  . Marital status: Widowed    Spouse name: Not on file  . Number of children: 3  . Years of education: 77  . Highest education level: High school graduate  Occupational History  . Occupation: retired  Tobacco Use  . Smoking status: Never Smoker  . Smokeless tobacco: Current User    Types: Chew, Snuff  Vaping Use  . Vaping Use: Never used  Substance and Sexual Activity  . Alcohol use: No  . Drug use: No  . Sexual activity: Not on file  Other Topics Concern  . Not on file  Social History Narrative   Lives with her daughter.   Right-handed.   1-2 cups caffeine per day.   Social Determinants of Health   Financial Resource Strain: Not on file  Food Insecurity: Not on file  Transportation Needs: Not on file  Physical Activity: Not on file  Stress: Not on file  Social Connections: Not on file  Intimate Partner Violence: Not on file     PHYSICAL EXAM   Vitals:   07/23/20 1528  BP: (!) 142/65  Pulse: 77  Weight: 119 lb (54 kg)  Height: _0  (1.575 m)   Not recorded     Body mass index is 21.77 kg/m.  PHYSICAL EXAMNIATION:  Gen: NAD, conversant, well nourised, well groomed, history is provided by her daughter                 Cardiovascular: Regular rate rhythm, no peripheral edema, warm, nontender. Eyes: Conjunctivae clear without exudates or hemorrhage Pulmonary: Clear to auscultation bilaterally   NEUROLOGICAL EXAM:  MENTAL STATUS: MMSE - Mini Mental State Exam 07/25/2019  Orientation to time 0  Orientation to Place 2  Registration 3  Attention/ Calculation 0   Recall 0  Language- name 2 objects 2  Language- repeat 1  Language- follow 3 step command 3  Language- read & follow direction 0  Write a sentence 0  Copy design 0  Total score 11  animal naming 5   CRANIAL NERVES: CN II: Visual fields are full to confrontation. Pupils are round equal and briskly reactive to light. CN III, IV, VI: extraocular movement are normal. No ptosis. CN V: Facial sensation is intact to light touch CN VII: Face is symmetric with normal eye closure  CN VIII: Hearing is normal to causal conversation. CN IX, X: Phonation is normal. CN XI: Head turning and shoulder shrug are intact  MOTOR: Good strength all extremities  REFLEXES: Reflexes are decreased throughout  SENSORY: Intact to light touch  COORDINATION: Finger-nose-finger and heel-to-shin is normal bilaterally  GAIT/STANCE: Gait is slightly wide-based, but steady, cautious, can walk independently  DIAGNOSTIC  DATA (LABS, IMAGING, TESTING) - I reviewed patient records, labs, notes, testing and imaging myself where available.  ASSESSMENT AND PLAN  Joplin Canty is a 86 y.o. female   1. Dementia  -CT head of the brain showed no significant atrophy, but evidence of supratentorium small vessel disease -Differentiation diagnosis of her dementia likely central nervous system degenerative disorder with superimposed vascular component -MRI of the brain to understand etiology of Dementia, but couldn't tolerate or lay still, will hold off for now after conversation with her daughter -MMSE was 11/30 last visit, disoriented to name, DOB, place today, but pleasant, interactive, follow exam commands fairly well -Laboratory evaluation showed no treatable etiology -Continue Namenda 10 mg twice a day, tolerating well  -Continue follow-up with PCP -Follow-up in our office in 1 year or sooner if needed  Butler Denmark, Laqueta Jean, DNP  [facility] [address], [place], [place]  [address] [phone]

## 2020-07-23 NOTE — Patient Instructions (Signed)
Continue Namenda at current dosing Monitor for increased supervision  Continue to see primary care See you back in 1 year

## 2020-12-03 ENCOUNTER — Other Ambulatory Visit: Payer: Self-pay | Admitting: Internal Medicine

## 2020-12-03 DIAGNOSIS — Z1231 Encounter for screening mammogram for malignant neoplasm of breast: Secondary | ICD-10-CM

## 2020-12-05 ENCOUNTER — Other Ambulatory Visit: Payer: Self-pay | Admitting: Neurology

## 2020-12-05 NOTE — Progress Notes (Addendum)
Chart reviewed, agree above plan  Addendum, laboratory evaluation from primary care dated December 27, 2020, hemoglobin of 12.1, normal CMP creatinine of 0.63, LDL 71, B12 784, normal TSH 1.  4 7

## 2020-12-17 ENCOUNTER — Telehealth: Payer: Self-pay | Admitting: Neurology

## 2020-12-17 NOTE — Telephone Encounter (Signed)
memantine (NAMENDA) 10 MG tablet TO [facility] , pt's daughter states pt only received a 61 day last time, she wasn't aware that a 1 yr was even an option for pt.  Daughter is asking for a call

## 2020-12-18 MED ORDER — MEMANTINE HCL 10 MG PO TABS: 10.0000 mg | ORAL_TABLET | Freq: Two times a day (BID) | ORAL | 1 refills | Status: DC

## 2020-12-18 NOTE — Addendum Note (Signed)
Addended by: Rhae Lerner R on: 12/18/2020 09:39 AM   Modules accepted: Orders

## 2020-12-18 NOTE — Telephone Encounter (Signed)
LVM for patient's daughter to return call.  Patient was given 90 day prescription with 3 refills, dated 08/12/2020.  She has a 1 year prescription for Namenda.

## 2020-12-18 NOTE — Telephone Encounter (Signed)
Patient's daughter Marzetta Board returned call.  Stated she was told she needed to call us for a refill.  A 90 day prescription was sent in with 3 refils on 07/23/20.  She received the first fill with no problems.  She also does not have the bottle available to see what it said about available refills remaining.    Called [facility] [phone]  Spoke to [place] and stated they had 1 filled in May and have 3 90 day refills left.    Returned patient's call and she then said she needed it filled at the [facility].    Reordered medication to be sent to the [facility] with 1 refill.

## 2020-12-20 NOTE — Telephone Encounter (Signed)
Error

## 2020-12-25 NOTE — Telephone Encounter (Signed)
Pt's daughter Donna Butler called stating she called memantine (NAMENDA) 10 MG tablet and they still have no record of the memantine (NAMENDA) 10 MG tablet. Donna Butler requesting a call back.

## 2020-12-26 ENCOUNTER — Other Ambulatory Visit: Payer: Self-pay | Admitting: *Deleted

## 2020-12-26 MED ORDER — MEMANTINE HCL 10 MG PO TABS: 10.0000 mg | ORAL_TABLET | Freq: Two times a day (BID) | ORAL | 1 refills | Status: DC

## 2020-12-26 NOTE — Telephone Encounter (Signed)
Per chart, 90-day supply x 1 refill sent to [facility] mail order on 12/18/20.  I called [facility] at [phone] and spoke to [place]. She confirmed the prescription for memantine did not come through electronically.  Rx resent through Epic. Also, provided a verbal order to pharmacist, Sheryle Spray, who will start processing the prescription. States the prescription should be delivered in 3-5 business days.   I called the patient's daughter back. Reports the patient has been off the medication for three weeks now. When she receives the supply of memantine 24m, she will start her with one tablet at bedtime for one week then increase back up to the one tab BID dosing.

## 2021-01-09 ENCOUNTER — Ambulatory Visit
Admission: RE | Admit: 2021-01-09 | Discharge: 2021-01-09 | Disposition: A | Discharge status: Home / Self Care | Payer: Medicare HMO | Source: Ambulatory Visit | Attending: Internal Medicine | Admitting: Internal Medicine

## 2021-01-09 ENCOUNTER — Other Ambulatory Visit: Payer: Self-pay

## 2021-01-09 DIAGNOSIS — Z1231 Encounter for screening mammogram for malignant neoplasm of breast: Secondary | ICD-10-CM

## 2021-04-26 IMAGING — CT CT ANGIO CHEST-ABD-PELV FOR DISSECTION W/ AND WO/W CM
2 of 7 series · 13 of 46 positions shown, 15 images · IV contrast (OMNI)
Comparison: Same-day chest radiographs, CT abdomen pelvis,
07/08/2017

CLINICAL DATA: Chest pain, acute aortic syndrome suspected

EXAM:
CT ANGIOGRAPHY CHEST, ABDOMEN AND PELVIS
TECHNIQUE: Multidetector CT imaging through the chest, abdomen and pelvis was
performed using the standard protocol during bolus administration of
intravenous contrast. Multiplanar reconstructed images and MIPs were
obtained and reviewed to evaluate the vascular anatomy.
CONTRAST:  100mL OMNIPAQUE IOHEXOL 350 MG/ML SOLN

[Series 6: dissection 3.0 i30f 3 · axial · 0.64mm/px · z∈[+790,+1318]mm · 10 of 199 slices shown, 12 images]
[im 12/199  soft-tissue]
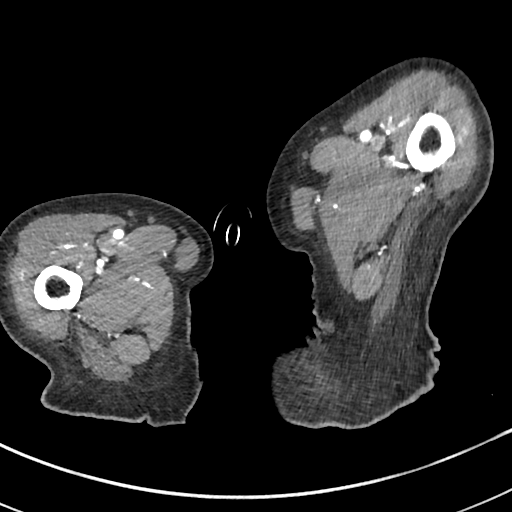
[im 12/199  bone]
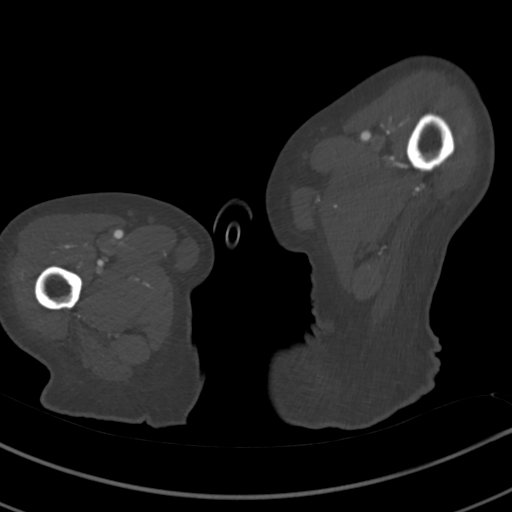
[im 34/199  soft-tissue]
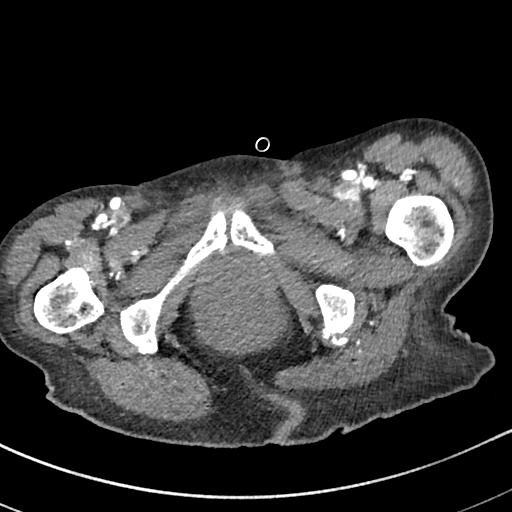
[im 56/199  soft-tissue]
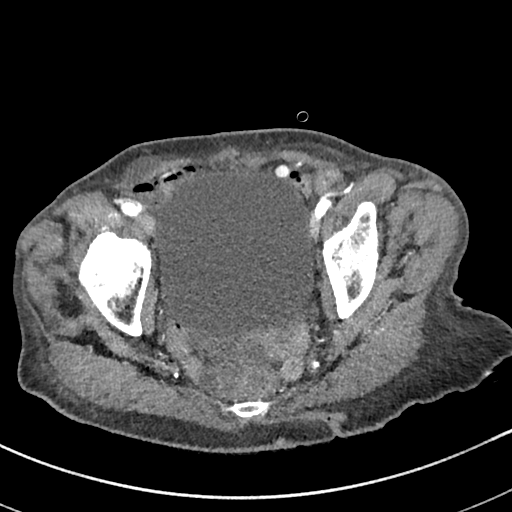
[im 67/199  soft-tissue]
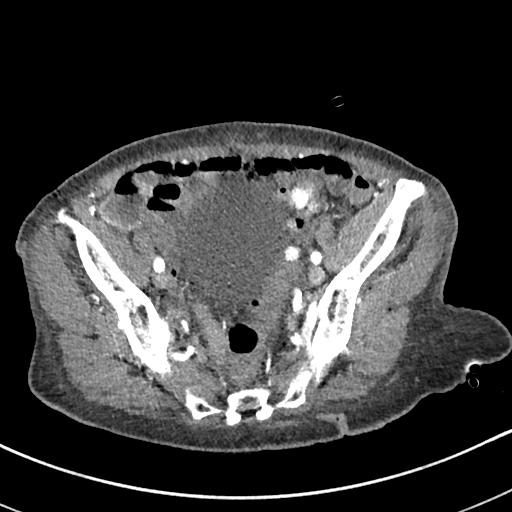
[im 89/199  soft-tissue]
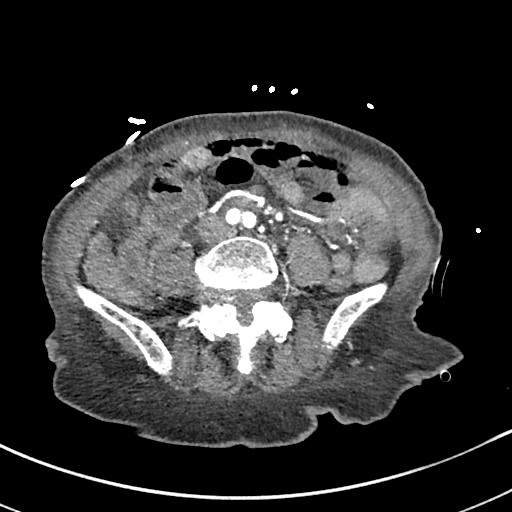
[im 111/199  soft-tissue]
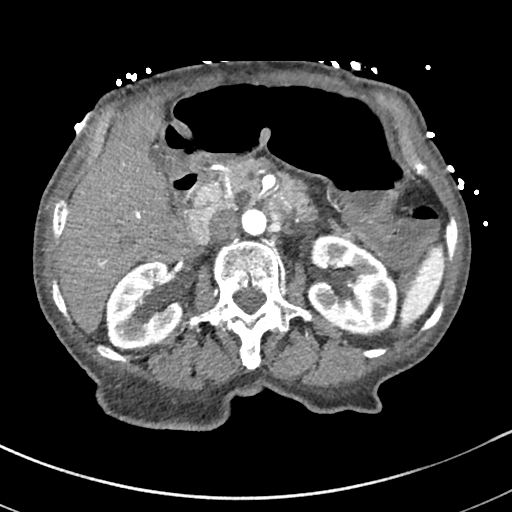
[im 133/199  soft-tissue]
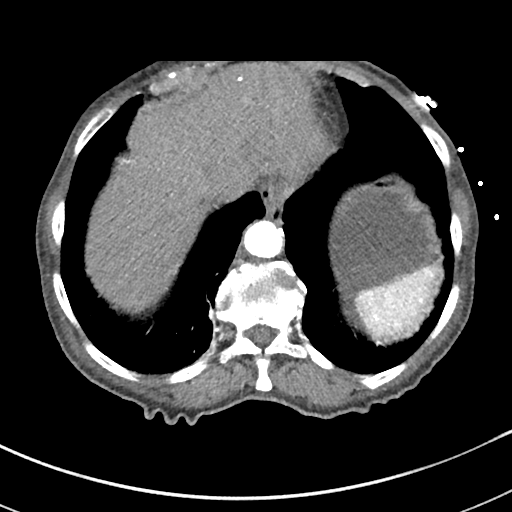
[im 144/199  soft-tissue]
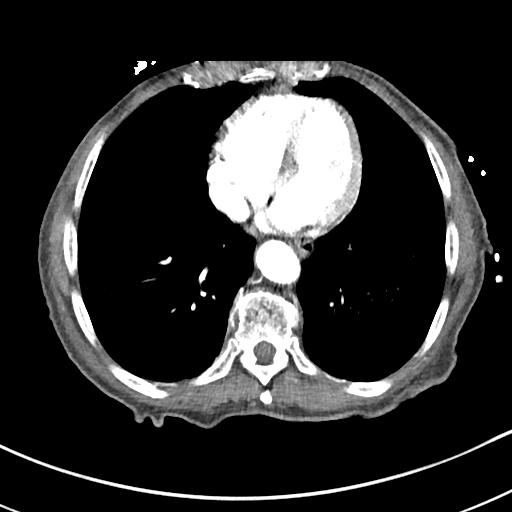
[im 166/199  soft-tissue]
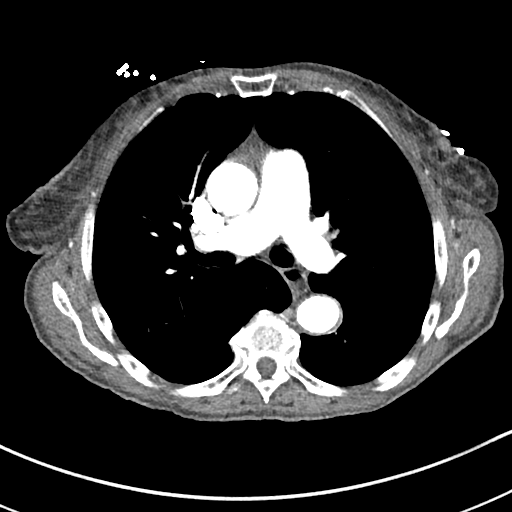
[im 166/199  bone]
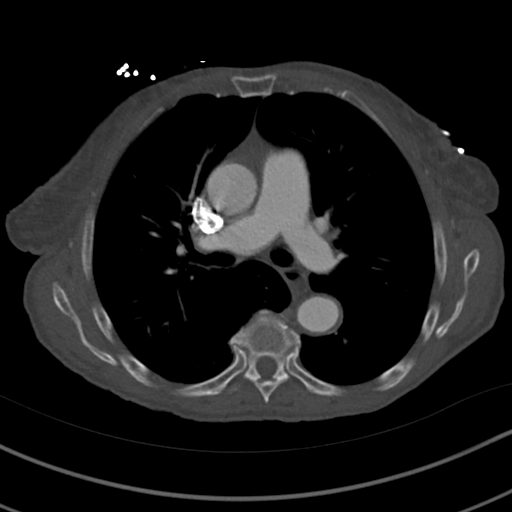
[im 188/199  soft-tissue]
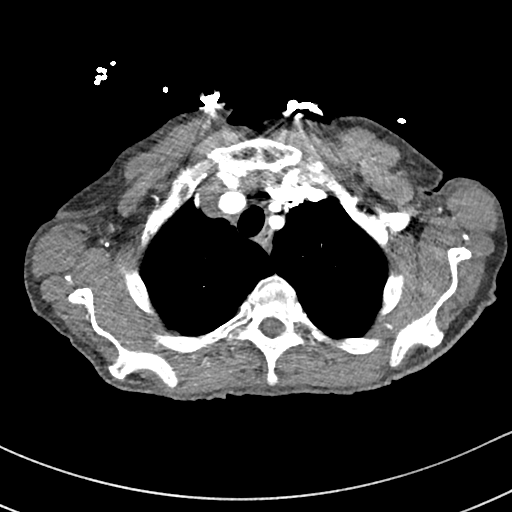

[Series 9: coronals · coronal · 0.66mm/px · 3 of 140 slices shown]
[im 35/140  soft-tissue]
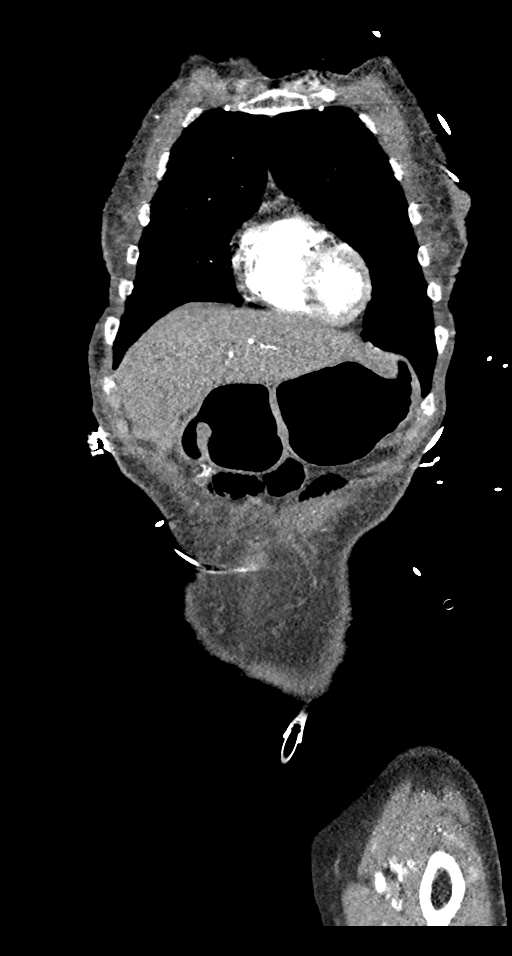
[im 70/140  soft-tissue]
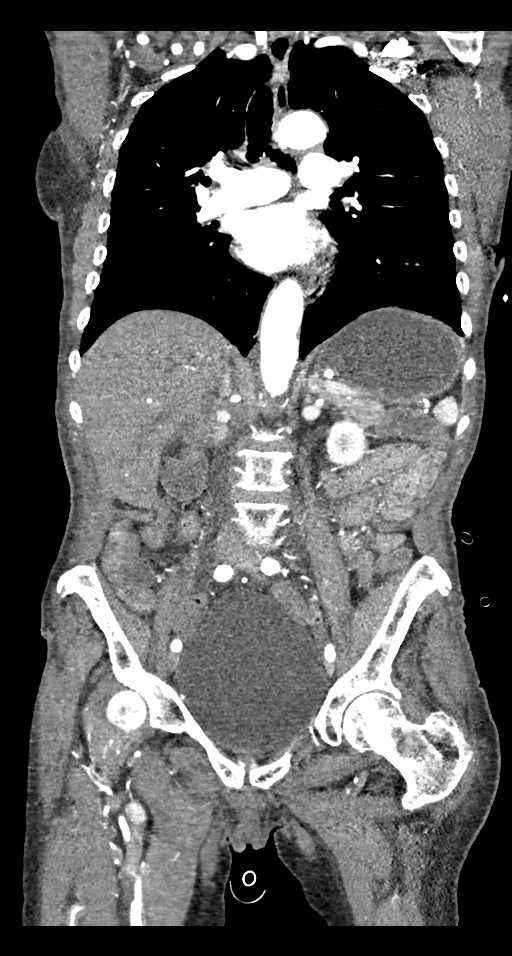
[im 105/140  soft-tissue]
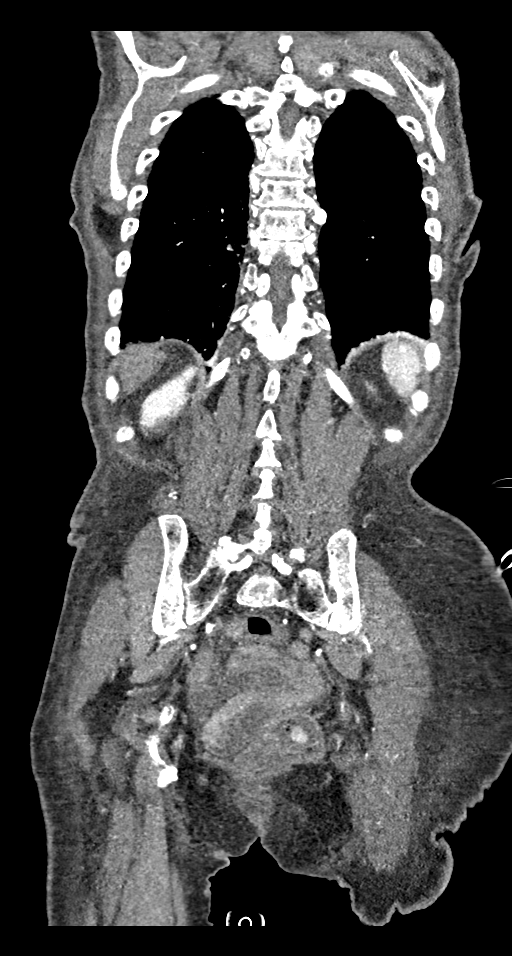

[13 of 46 positions shown; findings below may reference images not displayed]

FINDINGS: CTA CHEST FINDINGS

Cardiovascular: Preferential opacification of the thoracic aorta.
Normal caliber of the thoracic aorta, which is mildly tortuous, with
mild mixed calcific atherosclerosis. No evidence of aneurysm,
dissection, or other acute aortic syndrome. Normal heart size.
Scattered coronary artery calcifications including of the left main
coronary artery. No pericardial effusion.

Mediastinum/Nodes: No enlarged mediastinal, hilar, or axillary lymph
nodes. Thyroid gland, trachea, and esophagus demonstrate no
significant findings.

Lungs/Pleura: Minimal centrilobular emphysema and pulmonary
hyperinflation. Partially calcified, benign nodule of the medial
right lung base (series 6, image 50). No pleural effusion or
pneumothorax.

Musculoskeletal: No chest wall abnormality. No acute or significant
osseous findings.

Review of the MIP images confirms the above findings.

CTA ABDOMEN AND PELVIS FINDINGS

VASCULAR

Normal contour and caliber of the abdominal aorta, with mild to
moderate mixed calcific atherosclerosis. Standard branching pattern
of the abdominal aorta with solitary bilateral renal arteries. There
is mixed calcific atherosclerosis at the branch vessel origins
without high-grade stenosis.

Review of the MIP images confirms the above findings.

NON-VASCULAR

Hepatobiliary: No focal liver abnormality is seen. Status post
cholecystectomy. Postoperative biliary ductal dilatation similar to
prior examination.

Pancreas: Unremarkable. Dilation of the central pancreatic duct up
to approximately 8 mm without obstructing mass lesion identified,
this appearance unchanged compared to prior examination.

Spleen: Normal in size without significant abnormality.

Adrenals/Urinary Tract: Adrenal glands are unremarkable. There is
mild bilateral hydronephrosis and hydroureter without obstructing
calculi or other lesion identified. The urinary bladder is
distended.

Stomach/Bowel: Stomach is within normal limits. Appendix appears
normal. No evidence of bowel wall thickening, distention, or
inflammatory changes. Descending and sigmoid diverticulosis.

Lymphatic: No enlarged abdominal or pelvic lymph nodes.

Reproductive: No mass or other significant abnormality.

Other: Small bilateral inguinal hernias containing a single loop of
nonobstructed small bowel on the right and a single loop of
nonobstructed sigmoid colon on the left (series 6, image 150). No
abdominopelvic ascites.

Musculoskeletal: No acute or significant osseous findings.

Review of the MIP images confirms the above findings.
IMPRESSION: 1. Normal caliber of the thoracic and abdominal aorta without
evidence of aneurysm, dissection, or other acute aortic pathology.
Generally mild to moderate mixed atherosclerosis throughout. Aortic
Atherosclerosis (YN74J-AF6.6).
2. Mild bilateral hydronephrosis and hydroureter without obstructing
calculi or other lesion identified, likely due to back pressure
given distended urinary bladder. Correlate for urinary retention.
3. Coronary artery disease.
4.  Emphysema ([id number].S).
5. Other chronic, incidental, and postoperative findings as detailed
above.

## 2021-06-19 IMAGING — CT CT HEAD CODE STROKE
4 series · 16 of 47 positions shown, 18 images · non-contrast
Comparison: 05/28/2018
COMPARISON: 05/28/2018

Addendum:
CLINICAL DATA: Code stroke.  Altered mental status.  Aphasia.

EXAM:
CT HEAD WITHOUT CONTRAST
TECHNIQUE: Contiguous axial images were obtained from the base of the skull
through the vertex without intravenous contrast.

[Series 2: head wo · axial · 0.43mm/px · z∈[+1138,+1258]mm · 7 of 33 slices shown, 9 images]
[im 5/33  brain]
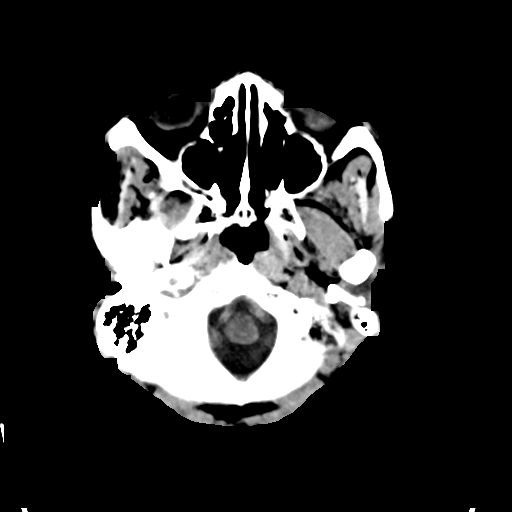
[im 5/33  bone]
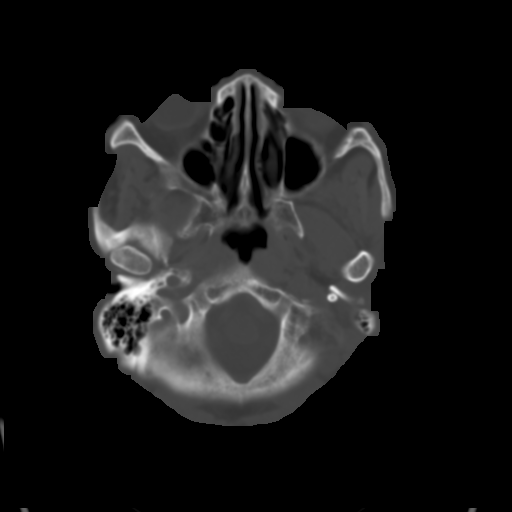
[im 9/33  brain]
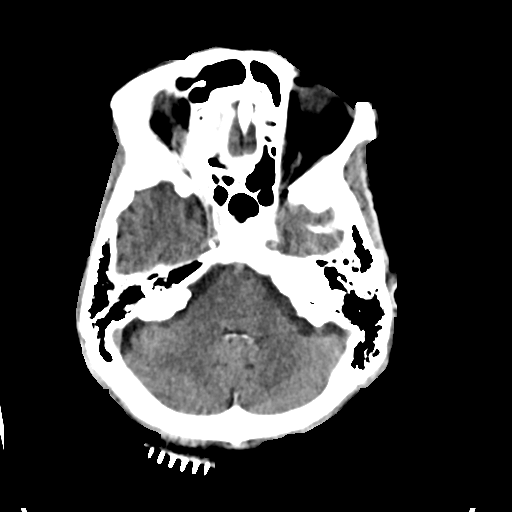
[im 13/33  brain]
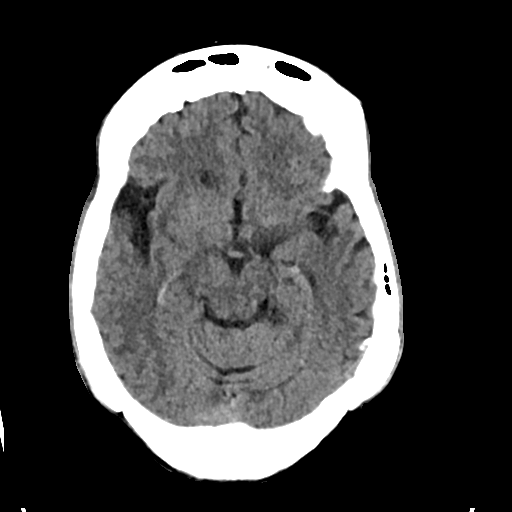
[im 17/33  brain]
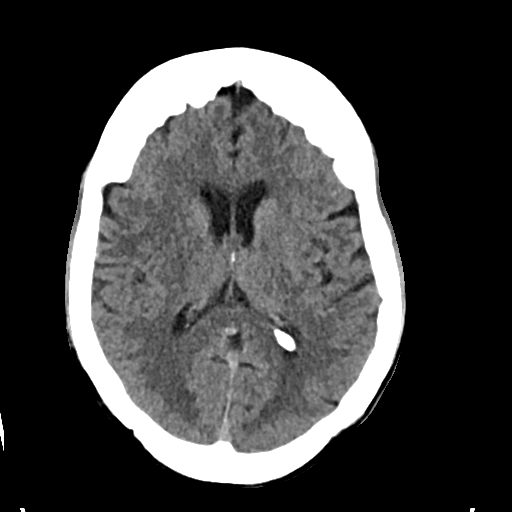
[im 21/33  brain]
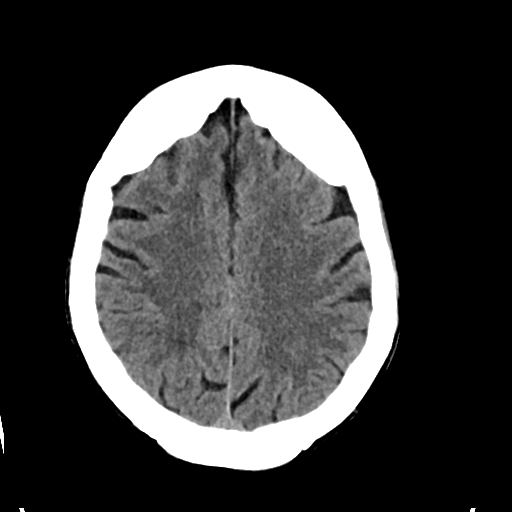
[im 21/33  bone]
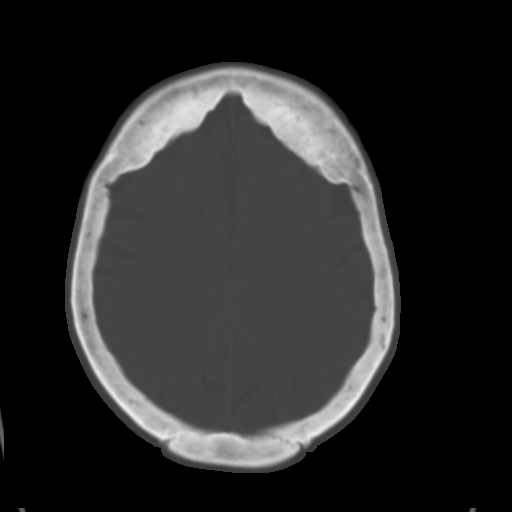
[im 25/33  brain]
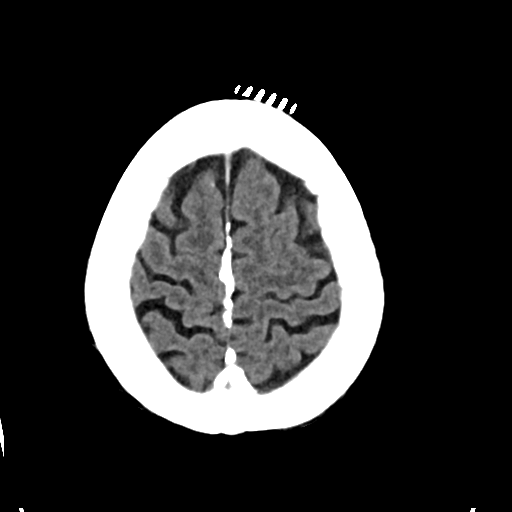
[im 29/33  brain]
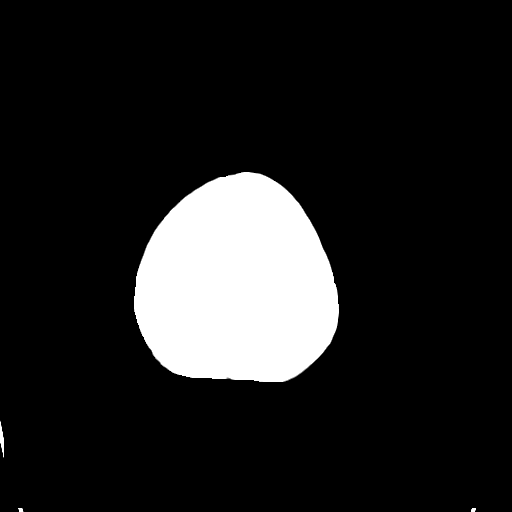

[Series 3: head bone · axial · 0.43mm/px · z∈[+1134,+1166]mm · 3 of 81 slices shown]
[im 9/81  bone]
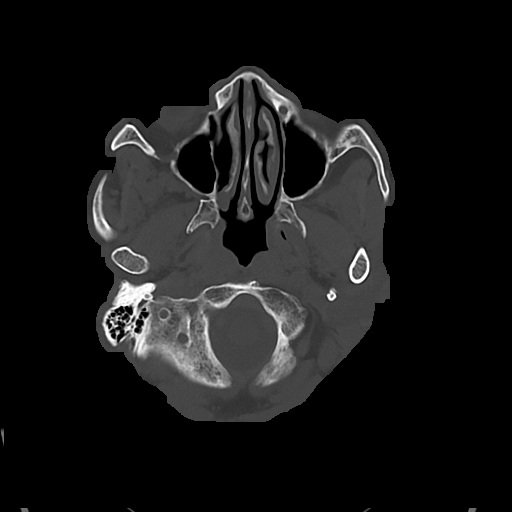
[im 17/81  bone]
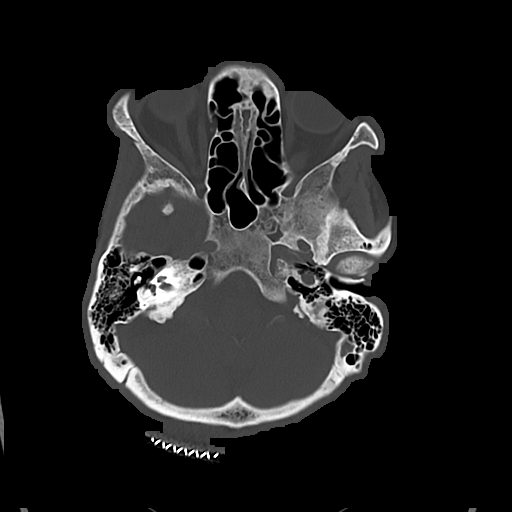
[im 25/81  bone]
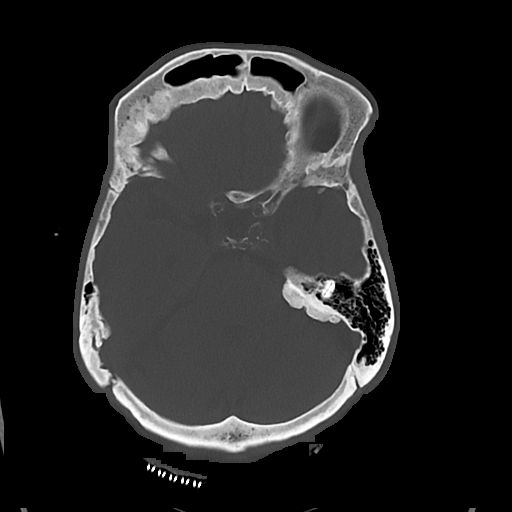

[Series 4: cor soft · coronal · 0.34mm/px · 3 of 73 slices shown]
[im 25/73  brain]
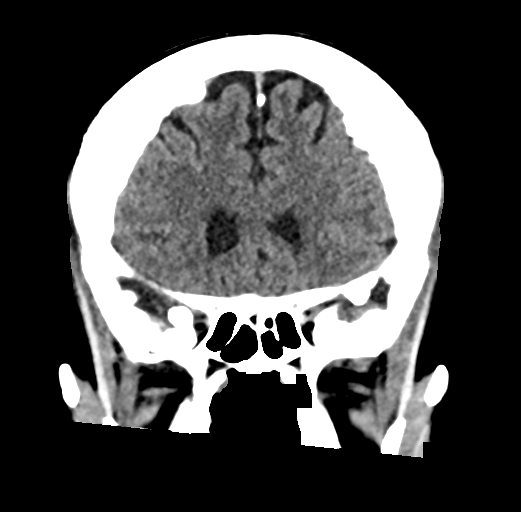
[im 33/73  brain]
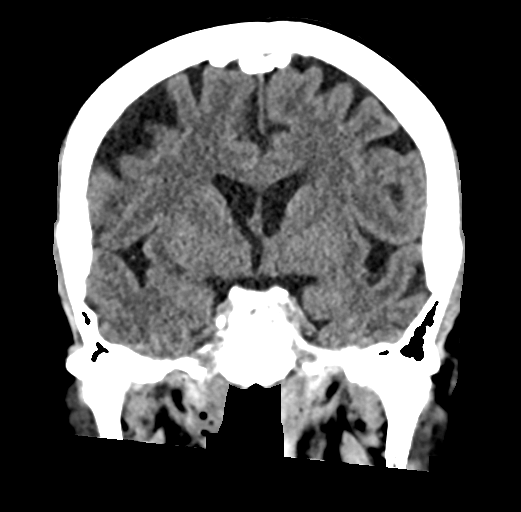
[im 41/73  brain]
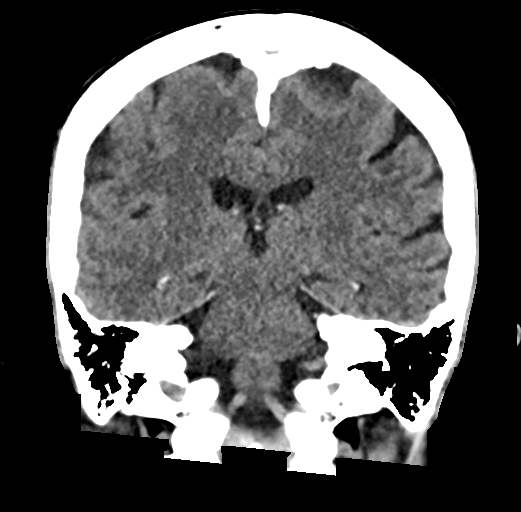

[Series 5: sag soft · sagittal · 0.35mm/px · 3 of 57 slices shown]
[im 19/57  brain]
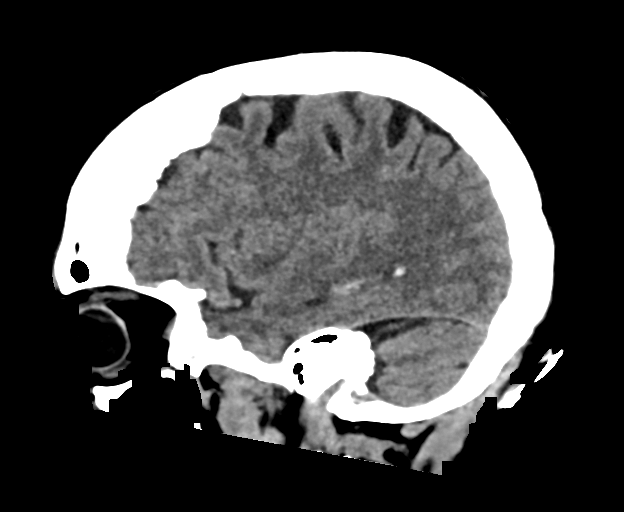
[im 29/57  brain]
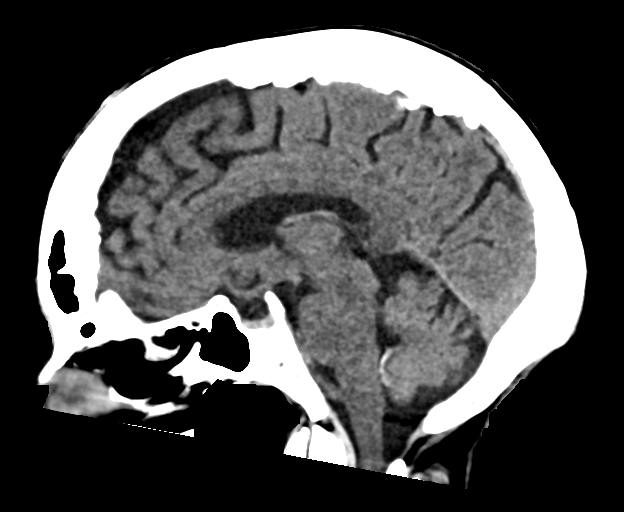
[im 38/57  brain]
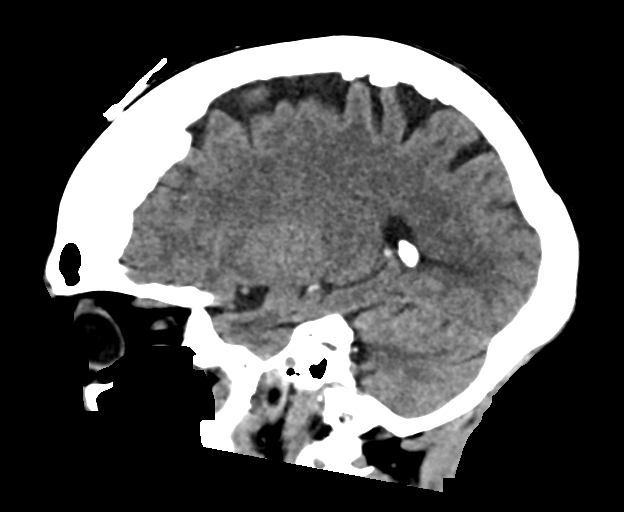

[16 of 47 positions shown; findings below may reference images not displayed]

FINDINGS: Brain: There is no evidence of acute infarct, intracranial
hemorrhage, mass, midline shift, or extra-axial fluid collection.
The ventricles and sulci are within normal limits for age.

Vascular: No hyperdense vessel.

Skull: Hyperostosis and dural ossification as previously seen. No
acute fracture or destructive osseous lesion.

Sinuses/Orbits: The visualized paranasal sinuses and mastoid air
cells are clear. Bilateral cataract extraction.

Other: None.

ASPECTS (Alberta Stroke Program Early CT Score)

- Ganglionic level infarction (caudate, lentiform nuclei, internal
capsule, insula, M1-M3 cortex): 7

- Supraganglionic infarction (M4-M6 cortex): 3

Total score (0-10 with 10 being normal): 10
IMPRESSION: 1. No evidence of acute intracranial abnormality.
2. ASPECTS is 10.

ADDENDUM:
These results were communicated to Dr. Keiichirou at [DATE] on
07/13/2019 by text page via the [employer] messaging system.

*** End of Addendum ***
FINDINGS: Brain: There is no evidence of acute infarct, intracranial
hemorrhage, mass, midline shift, or extra-axial fluid collection.
The ventricles and sulci are within normal limits for age.

Vascular: No hyperdense vessel.

Skull: Hyperostosis and dural ossification as previously seen. No
acute fracture or destructive osseous lesion.

Sinuses/Orbits: The visualized paranasal sinuses and mastoid air
cells are clear. Bilateral cataract extraction.

Other: None.

ASPECTS (Alberta Stroke Program Early CT Score)

- Ganglionic level infarction (caudate, lentiform nuclei, internal
capsule, insula, M1-M3 cortex): 7

- Supraganglionic infarction (M4-M6 cortex): 3

Total score (0-10 with 10 being normal): 10
IMPRESSION: 1. No evidence of acute intracranial abnormality.
2. ASPECTS is 10.

## 2021-07-23 NOTE — Progress Notes (Signed)
? ? ?PATIENT: Donna Butler ?DOB: [birth year] ? ?Chief Complaint  ?Patient presents with  ? Follow-up  ?  Rm 13. Accompanied by daughter, Marzetta Board. ?Requesting refills of memantine. Pt remains stable.  ?  ? ?HISTORICAL ? ?Donna Butler is a 87 year old female, seen in request by her primary care physician Dr. Jilda Panda for evaluation of memory loss, initial evaluation was on Jul 25, 2019,  ? ?I have reviewed and summarized the referring note from the referring physician.  She had a past medical history of hypertension, hyperlipidemia, dementia, being less responsive at home, was admitted to hospital on July 14, 2019, her symptom improved with multiple rounds of IV potassium and IV saline, there was also evidence of hyponatremia initially, that was normalized with IV hydration, ? ?I personally reviewed CT head without contrast on July 13, 2019, no acute abnormality, extensive supratentorium small vessel disease ? ?Laboratory evaluation:BMP showed creatinine 0.99, B12 1220, normal ESR, TSH, CBC showed hemoglobin of 9.4, ? ?EEG showed triphasic waves, continuous generalized slowing, ? ?She was discharged with diagnosis of metabolic encephalopathy, acute renal failure, bladder outlet obstruction, failed voiding trials, was discharged home with Foley catheter, urology follow-up as outpatient, ?  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 ?Update Jul 23, 2020 SS: Here with her daughter. She lives with her daughter. At last visit brain was ordered, but couldn't be completed, she wouldn't stay still. She thinks memory is doing well, she does cleaning, cooking sometimes. Not driving.  Daughter manages medications. Sleeps well, good appetite. Enjoys watching TV. No falls. She repeats herself, forget the days. Generally mood is good. Remains on Namenda 10 mg twice daily. Couldn't tell me her DOB, or date. Knew her and daughter's name.  ? ?Update Jul 24, 2021 SS: Lives with daughter, Marzetta Board. Thinks memory doing fairly well, does some light housework, uses microwave, appetite is fair, weight is down 10 lbs since last year. Drinks ensure. No falls, gets around independently, sleeps well. Mood is good. Is very pleasant, remains on Namenda 10 mg twice daily. No health issues.  ? ?REVIEW OF SYSTEMS: Full 14 system review of systems performed and notable only for as above ? ?See HPI ? ?ALLERGIES: ?Allergies  ?Allergen Reactions  ? Pantoprazole Other (See Comments)  ?  Caused lethargy and an overall feeling of "just not feeling well"  ? Penicillins Other (See Comments)  ?  Reaction not recalled, but allergic ?Has patient had a PCN reaction causing immediate rash, facial/tongue/throat swelling, SOB or lightheadedness with hypotension: Unk ?Has patient had a PCN reaction causing severe rash involving mucus membranes or skin necrosis: No ?Has patient had a PCN reaction that required hospitalization: No ?Has patient had a PCN reaction occurring within the last 10 years: No ?If all of the above answers are "NO", then may proceed with Cephalosporin use. ?  ? ? ?HOME MEDICATIONS: ?Current Outpatient Medications  ?Medication Sig Dispense Refill  ? acetaminophen (TYLENOL) 500 MG tablet Take 500-1,000 mg by mouth every 6 (six) hours as needed for mild pain or headache.    ? lovastatin (MEVACOR) 40 MG tablet Take 80 mg by mouth at bedtime.     ? memantine (  NAMENDA) 10 MG tablet Take 1 tablet (10 mg total) by mouth 2 (two) times daily. 180 tablet 1  ? mirtazapine (REMERON) 15 MG tablet Take 15 mg by mouth at bedtime.    ? amLODipine (NORVASC) 5 MG tablet Take 1 tablet (5 mg total) by mouth daily. 30 tablet 0  ?  carvedilol (COREG) 12.5 MG tablet Take 1 tablet (12.5 mg total) by mouth 2 (two) times daily with a meal. 60 tablet 0  ? fluticasone (FLONASE) 50 MCG/ACT nasal spray Place 1 spray into both nostrils daily. 31.6 mL 2  ? ?No current facility-administered medications for this visit.  ? ? ?PAST MEDICAL HISTORY: ?Past Medical History:  ?Diagnosis Date  ? Dementia ([place])   ? "had a scan for [Alzheimer's] and they didn't find anything"  ? High cholesterol   ? Hypertension   ? Hyponatremia 07/14/2019  ? ? ?PAST SURGICAL HISTORY: ?Past Surgical History:  ?Procedure Laterality Date  ? ABDOMINAL HYSTERECTOMY    ? ? ?FAMILY HISTORY: ?Family History  ?Problem Relation Age of Onset  ? Diabetes Mother   ? Other Father   ?     unsure of history  ? ? ?SOCIAL HISTORY: ?Social History  ? ?Socioeconomic History  ? Marital status: Widowed  ?  Spouse name: Not on file  ? Number of children: 3  ? Years of education: 89  ? Highest education level: High school graduate  ?Occupational History  ? Occupation: retired  ?Tobacco Use  ? Smoking status: Never  ? Smokeless tobacco: Current  ?  Types: Chew, Snuff  ?Vaping Use  ? Vaping Use: Never used  ?Substance and Sexual Activity  ? Alcohol use: No  ? Drug use: No  ? Sexual activity: Not on file  ?Other Topics Concern  ? Not on file  ?Social History Narrative  ? Lives with her daughter.  ? Right-handed.  ? 1-2 cups caffeine per day.  ? ?Social Determinants of Health  ? ?Financial Resource Strain: Not on file  ?Food Insecurity: Not on file  ?Transportation Needs: Not on file  ?Physical Activity: Not on file  ?Stress: Not on file  ?Social Connections: Not on file  ?Intimate Partner Violence: Not on file  ? ?PHYSICAL EXAM ?  ?Vitals:  ? 07/24/21 1558  ?BP: 133/69  ?Pulse: 69  ?Weight: 110 lb (49.9 kg)  ?Height: 5' 2"  (1.575 m)  ? ? ?Not recorded ?  ? ?Physical Exam ? ?General: The patient is alert and cooperative at the time of the examination.  Very pleasant. ? ?  07/25/2019  ?  4:02 PM  ?MMSE - Mini  Mental State Exam  ?Orientation to time 0  ?Orientation to Place 2  ?Registration 3  ?Attention/ Calculation 0  ?Recall 0  ?Language- name 2 objects 2  ?Language- repeat 1  ?Language- follow 3 step command 3  ?Language- read & follow direction 0  ?Write a sentence 0  ?Copy design 0  ?Total score 11  ? ? ?Skin: No significant peripheral edema is noted. ? ?Neurologic Exam ? ?Mental status: The patient is alert, history is provided by her daughter, patient is participatory, but is not a reliable historian, is smiling, some apraxia with exam commands, speech is clear ? ?Cranial nerves: Facial symmetry is present. Speech is normal, no aphasia or dysarthria is noted. Extraocular movements are full. Visual fields are full. ? ?Motor: The patient has good strength in all 4 extremities. ? ?Sensory examination: Soft touch sensation is symmetric on the  face, arms, and legs. ? ?Coordination: The patient has good finger-nose-finger and heel-to-shin bilaterally. ? ?Gait and station: The patient has a normal gait.  Is independent.. ? ?Reflexes: Deep tendon reflexes are symmetric. ? ?DIAGNOSTIC DATA (LABS, IMAGING, TESTING) ?- I reviewed patient records, labs, notes, testing and imaging myself where available. ? ?ASSESSMENT AND PLAN ? ?Donna Butler is a 87 y.o. female   ?1. Dementia ? ?-Overall stable, cannot complete MMSE ?-Continue Namenda 10 mg twice daily ?-Follow-up with primary care provider going forward, return here as needed ? ?Butler Denmark, AGNP-C, DNP  ?[facility] ?[address], [address] ?[place], [place] [address] ?[phone] ?

## 2021-07-24 ENCOUNTER — Encounter: Payer: Self-pay | Admitting: Neurology

## 2021-07-24 ENCOUNTER — Ambulatory Visit (INDEPENDENT_AMBULATORY_CARE_PROVIDER_SITE_OTHER): Payer: Medicare HMO | Admitting: Neurology

## 2021-07-24 VITALS — BP 133/69 | HR 69 | Ht 62.0 in | Wt 110.0 lb

## 2021-07-24 DIAGNOSIS — F0390 Unspecified dementia without behavioral disturbance: Secondary | ICD-10-CM | POA: Diagnosis not present

## 2021-07-24 MED ORDER — MEMANTINE HCL 10 MG PO TABS: 10.0000 mg | ORAL_TABLET | Freq: Two times a day (BID) | ORAL | 3 refills | Status: DC

## 2021-07-24 NOTE — Patient Instructions (Signed)
Great to see you today ?Continue the Namenda  ?See primary care going forward ?Return here as needed ?

## 2021-10-03 ENCOUNTER — Ambulatory Visit
Admission: EM | Admit: 2021-10-03 | Discharge: 2021-10-03 | Disposition: A | Discharge status: Home / Self Care | Payer: Medicare HMO | Attending: Family Medicine | Admitting: Family Medicine

## 2021-10-03 DIAGNOSIS — J069 Acute upper respiratory infection, unspecified: Secondary | ICD-10-CM | POA: Diagnosis not present

## 2021-10-03 MED ORDER — CHERATUSSIN AC 100-10 MG/5ML PO SOLN: 5.0000 mL | Freq: Four times a day (QID) | ORAL | 0 refills | Status: AC | PRN

## 2021-10-03 NOTE — ED Triage Notes (Signed)
Pt presents to uc with co of congestion and cold like symptoms for 3 days pt support person reports mild cold and cough medications otc.

## 2021-10-03 NOTE — ED Provider Notes (Signed)
[facility]    CSN: [id number] Arrival date & time: 10/03/21  1701      History   Chief Complaint Chief Complaint  Patient presents with   Nasal Congestion    HPI Donna Butler is a 87 y.o. female.   HPI Here for cough, rhinorrhea and congestion that began on July 10.  No fever so far.  No vomiting or diarrhea.  She has been eating like usual.  She does not feel short of breath nor has she heard any wheezing.  Apparently daughter has tried some Best boy for her and they have not helped.  The cough has kept her from sleeping well  Past Medical History:  Diagnosis Date   Dementia ([place])    "had a scan for [Alzheimer's] and they didn't find anything"   High cholesterol    Hypertension    Hyponatremia 07/14/2019    Patient Active Problem List   Diagnosis Date Noted   Dementia without behavioral disturbance ([place]) 07/25/2019   Anemia 07/25/2019   Hyponatremia 07/14/2019   Chest pain 05/20/2019   Dyslipidemia 05/20/2019   Dementia with behavioral disturbance ([place]) 05/20/2019   Bilateral hydronephrosis 05/20/2019   Tobacco dependence due to chewing tobacco 05/20/2019   Colitis presumed infectious 07/08/2017   Hypokalemia 07/08/2017   Hypertension 07/08/2017   Colitis 07/08/2017   Dilation of biliary tract 07/08/2017   Splenic lesion 07/08/2017    Past Surgical History:  Procedure Laterality Date   ABDOMINAL HYSTERECTOMY      OB History   No obstetric history on file.      Home Medications    Prior to Admission medications   Medication Sig Start Date End Date Taking? Authorizing Provider  guaiFENesin-codeine (CHERATUSSIN AC) 100-10 MG/5ML syrup Take 5 mLs by mouth 4 (four) times daily as needed for cough. 10/03/21  Yes Barrett Henle, MD  acetaminophen (TYLENOL) 500 MG tablet Take 500-1,000 mg by mouth every 6 (six) hours as needed for mild pain or headache.    [provider]  amLODipine (NORVASC) 5 MG tablet Take 1  tablet (5 mg total) by mouth daily. 07/18/19 08/17/19  Donne Hazel, MD  carvedilol (COREG) 12.5 MG tablet Take 1 tablet (12.5 mg total) by mouth 2 (two) times daily with a meal. 07/17/19 08/16/19  Donne Hazel, MD  fluticasone [facility]) 50 MCG/ACT nasal spray Place 1 spray into both nostrils daily. 01/30/20 01/29/21  Rudolpho Sevin, NP  lovastatin (MEVACOR) 40 MG tablet Take 80 mg by mouth at bedtime.     [provider]  memantine (NAMENDA) 10 MG tablet Take 1 tablet (10 mg total) by mouth 2 (two) times daily. 07/24/21   Suzzanne Cloud, NP  mirtazapine (REMERON) 15 MG tablet Take 15 mg by mouth at bedtime. 11/13/20   [provider]    Family History Family History  Problem Relation Age of Onset   Diabetes Mother    Other Father        unsure of history    Social History Social History   Tobacco Use   Smoking status: Never   Smokeless tobacco: Current    Types: Chew, Snuff  Vaping Use   Vaping Use: Never used  Substance Use Topics   Alcohol use: No   Drug use: No     Allergies   Pantoprazole and Penicillins   Review of Systems Review of Systems   Physical Exam Triage Vital Signs ED Triage Vitals  Enc Vitals Group  BP 10/03/21 1716 116/62     Pulse Rate 10/03/21 1716 (!) 18     Resp 10/03/21 1716 12     Temp 10/03/21 1716 98.8 F (37.1 C)     Temp src --      SpO2 10/03/21 1716 98 %     Weight --      Height --      Head Circumference --      Peak Flow --      Pain Score 10/03/21 1715 0     Pain Loc --      Pain Edu? --      Excl. in [place]? --    No data found.  Updated Vital Signs BP 116/62   Pulse (!) 18   Temp 98.8 F (37.1 C)   Resp 12   SpO2 98%   Visual Acuity Right Eye Distance:   Left Eye Distance:   Bilateral Distance:    Right Eye Near:   Left Eye Near:    Bilateral Near:     Physical Exam Vitals reviewed.  Constitutional:      General: She is not in acute distress.    Appearance: She is not toxic-appearing.   HENT:     Right Ear: Tympanic membrane and ear canal normal.     Left Ear: Tympanic membrane and ear canal normal.     Nose: Nose normal.     Mouth/Throat:     Mouth: Mucous membranes are moist.     Pharynx: No oropharyngeal exudate or posterior oropharyngeal erythema.  Eyes:     Extraocular Movements: Extraocular movements intact.     Conjunctiva/sclera: Conjunctivae normal.     Pupils: Pupils are equal, round, and reactive to light.  Cardiovascular:     Rate and Rhythm: Normal rate and regular rhythm.     Heart sounds: No murmur heard. Pulmonary:     Effort: Pulmonary effort is normal. No respiratory distress.     Breath sounds: No stridor. No wheezing, rhonchi or rales.  Chest:     Chest wall: No tenderness.  Musculoskeletal:     Cervical back: Neck supple.  Lymphadenopathy:     Cervical: No cervical adenopathy.  Skin:    Capillary Refill: Capillary refill takes less than 2 seconds.     Coloration: Skin is not jaundiced or pale.  Neurological:     General: No focal deficit present.     Mental Status: She is alert and oriented to person, place, and time.  Psychiatric:        Behavior: Behavior normal.      UC Treatments / Results  Labs (all labs ordered are listed, but only abnormal results are displayed) Labs Reviewed  NOVEL CORONAVIRUS, NAA    EKG   Radiology No results found.  Procedures Procedures (including critical care time)  Medications Ordered in UC Medications - No data to display  Initial Impression / Assessment and Plan / UC Course  I have reviewed the triage vital signs and the nursing notes.  Pertinent labs & imaging results that were available during my care of the patient were reviewed by me and considered in my medical decision making (see chart for details).     Exam is benign.  I will send in some Robitussin with codeine for nighttime cough.  She is swabbed for COVID, and if she is positive she is at risk for severe disease.  If  she is positive she should have molnupiravir prescription Final Clinical Impressions(s) /  UC Diagnoses   Final diagnoses:  Viral URI with cough     Discharge Instructions      Robitussin with codeine--take 5 mL or 1 teaspoon 4 times daily as needed for cough.  This medication could make you sleepy or dizzy   You have been swabbed for COVID, and the test will result in the next 24 hours. Our staff will call you if positive. If the test is positive, you should quarantine for 5 days.       ED Prescriptions     Medication Sig Dispense Auth. Provider   guaiFENesin-codeine (CHERATUSSIN AC) 100-10 MG/5ML syrup Take 5 mLs by mouth 4 (four) times daily as needed for cough. 120 mL Barrett Henle, MD      I have reviewed the PDMP during this encounter.   Barrett Henle, MD 10/03/21 [phone]

## 2021-10-03 NOTE — Discharge Instructions (Addendum)
Robitussin with codeine--take 5 mL or 1 teaspoon 4 times daily as needed for cough.  This medication could make you sleepy or dizzy   You have been swabbed for COVID, and the test will result in the next 24 hours. Our staff will call you if positive. If the test is positive, you should quarantine for 5 days.

## 2021-10-05 LAB — NOVEL CORONAVIRUS, NAA: SARS-CoV-2, NAA: DETECTED — AB

## 2022-01-03 ENCOUNTER — Other Ambulatory Visit: Payer: Self-pay | Admitting: Internal Medicine

## 2022-01-03 DIAGNOSIS — Z1231 Encounter for screening mammogram for malignant neoplasm of breast: Secondary | ICD-10-CM

## 2022-02-12 ENCOUNTER — Ambulatory Visit
Admission: RE | Admit: 2022-02-12 | Discharge: 2022-02-12 | Disposition: A | Discharge status: Home / Self Care | Payer: Medicare HMO | Source: Ambulatory Visit | Attending: Internal Medicine | Admitting: Internal Medicine

## 2022-02-12 DIAGNOSIS — Z1231 Encounter for screening mammogram for malignant neoplasm of breast: Secondary | ICD-10-CM

## 2022-07-22 ENCOUNTER — Telehealth: Payer: Self-pay | Admitting: Neurology

## 2022-07-22 MED ORDER — MEMANTINE HCL 10 MG PO TABS: 10.0000 mg | ORAL_TABLET | Freq: Two times a day (BID) | ORAL | 1 refills | Status: AC

## 2022-07-22 NOTE — Addendum Note (Signed)
Addended by: Deatra James on: 07/22/2022 01:58 PM   Modules accepted: Orders

## 2022-07-22 NOTE — Telephone Encounter (Signed)
Refill has been sent.  °

## 2022-07-22 NOTE — Telephone Encounter (Signed)
Pt is scheduled for an appointment with Sarah for 10/28/22 and would like to know if she has refills for memantine (NAMENDA) 10 MG tablet [[id number]] to hold her over until then

## 2022-07-29 ENCOUNTER — Ambulatory Visit: Payer: Medicare HMO | Admitting: Neurology

## 2022-07-31 ENCOUNTER — Ambulatory Visit: Payer: Medicare HMO | Admitting: Neurology

## 2022-08-13 ENCOUNTER — Ambulatory Visit: Payer: Medicare HMO | Admitting: Neurology

## 2022-10-27 ENCOUNTER — Telehealth: Payer: Self-pay | Admitting: Neurology

## 2022-10-27 NOTE — Telephone Encounter (Signed)
Spoke to pt's daughter and she stated that she will f/u with pt's PCP and request for PCP to take over medication refills.

## 2022-10-27 NOTE — Telephone Encounter (Signed)
I apologize for the inconvenience. At our last visit I had said she should return as needed. I am not sure how appointment even got scheduled. She can see her primary care doctor going forward. Thanks

## 2022-10-27 NOTE — Telephone Encounter (Signed)
Called pt's daughter Kennyth Arnold to reschedule tomorrow's appt due to Donna Butler being out. She became upset and stated that this was the fourth time we've rescheduled her. She stated that this was very inconvenient because she has to take off of work to bring her every time. She is asking for a call from one of the nurses or Donna Butler to discuss if her mother needs to come in for this appointment. Best call back # is [phone].

## 2022-10-28 ENCOUNTER — Ambulatory Visit: Payer: Medicare HMO | Admitting: Neurology

## 2023-02-18 ENCOUNTER — Other Ambulatory Visit: Payer: Self-pay | Admitting: Internal Medicine

## 2023-02-18 DIAGNOSIS — Z1231 Encounter for screening mammogram for malignant neoplasm of breast: Secondary | ICD-10-CM

## 2023-03-15 ENCOUNTER — Other Ambulatory Visit: Payer: Self-pay | Admitting: Neurology

## 2023-03-24 ENCOUNTER — Ambulatory Visit
Admission: RE | Admit: 2023-03-24 | Discharge: 2023-03-24 | Disposition: A | Discharge status: Home / Self Care | Payer: Medicare HMO | Source: Ambulatory Visit | Attending: Internal Medicine | Admitting: Internal Medicine

## 2023-03-24 DIAGNOSIS — Z1231 Encounter for screening mammogram for malignant neoplasm of breast: Secondary | ICD-10-CM

## 2023-07-25 ENCOUNTER — Other Ambulatory Visit: Payer: Self-pay | Admitting: Neurology

## 2023-11-06 ENCOUNTER — Other Ambulatory Visit: Payer: Self-pay

## 2023-11-06 ENCOUNTER — Emergency Department (HOSPITAL_COMMUNITY)
Admission: EM | Admit: 2023-11-06 | Discharge: 2023-11-07 | Disposition: A | Discharge status: Home / Self Care | Attending: Emergency Medicine | Admitting: Emergency Medicine

## 2023-11-06 DIAGNOSIS — F0390 Unspecified dementia without behavioral disturbance: Secondary | ICD-10-CM | POA: Diagnosis not present

## 2023-11-06 DIAGNOSIS — Z79899 Other long term (current) drug therapy: Secondary | ICD-10-CM | POA: Diagnosis not present

## 2023-11-06 DIAGNOSIS — Y92009 Unspecified place in unspecified non-institutional (private) residence as the place of occurrence of the external cause: Secondary | ICD-10-CM | POA: Diagnosis not present

## 2023-11-06 DIAGNOSIS — W06XXXA Fall from bed, initial encounter: Secondary | ICD-10-CM | POA: Insufficient documentation

## 2023-11-06 DIAGNOSIS — W19XXXA Unspecified fall, initial encounter: Secondary | ICD-10-CM

## 2023-11-06 DIAGNOSIS — S0990XA Unspecified injury of head, initial encounter: Secondary | ICD-10-CM | POA: Diagnosis present

## 2023-11-06 DIAGNOSIS — I10 Essential (primary) hypertension: Secondary | ICD-10-CM | POA: Diagnosis not present

## 2023-11-06 NOTE — ED Triage Notes (Signed)
 Pt bib ems from home. Slid out of bed. Called out to daughter. No LOC. No neck pain. C/o lumbar and left leg pain. No shortening or rotation noted. Pt has dementia but is at baseline. No thinners

## 2023-11-06 NOTE — ED Provider Notes (Signed)
 [place] EMERGENCY DEPARTMENT AT [facility] Provider Note  CSN: [id number] Arrival date & time: 11/06/23 2212  Chief Complaint(s) Fall  HPI Donna Butler is a 89 y.o. female with a past medical history listed below including dementia and prior hyponatremia here after being found down on the floor by the daughter while at home.  Daughter reports that the patient called out for help and when she came in the room she found her next to her bed.  Prior to that the patient was seen sitting on the side of the bed with a tray of food eating.  Daughter believes he might of slipped out of bed onto the floor.  Patient denies any pain but unable to provide any details of the fall.  The history is provided by a relative.    Past Medical History Past Medical History:  Diagnosis Date   Dementia (HCC)    had a scan for [Alzheimer's] and they didn't find anything   High cholesterol    Hypertension    Hyponatremia 07/14/2019   Patient Active Problem List   Diagnosis Date Noted   Dementia without behavioral disturbance (HCC) 07/25/2019   Anemia 07/25/2019   Hyponatremia 07/14/2019   Chest pain 05/20/2019   Dyslipidemia 05/20/2019   Dementia with behavioral disturbance (HCC) 05/20/2019   Bilateral hydronephrosis 05/20/2019   Tobacco dependence due to chewing tobacco 05/20/2019   Colitis presumed infectious 07/08/2017   Hypokalemia 07/08/2017   Hypertension 07/08/2017   Colitis 07/08/2017   Dilation of biliary tract 07/08/2017   Splenic lesion 07/08/2017   Home Medication(s) Prior to Admission medications   Medication Sig Start Date End Date Taking? Authorizing Provider  acetaminophen (TYLENOL) 500 MG tablet Take 500-1,000 mg by mouth every 6 (six) hours as needed for mild pain or headache.    [provider]  amLODipine (NORVASC) 5 MG tablet Take 1 tablet (5 mg total) by mouth daily. 07/18/19 08/17/19  Cindy Garnette POUR, MD  carvedilol (COREG) 12.5 MG tablet Take  1 tablet (12.5 mg total) by mouth 2 (two) times daily with a meal. 07/17/19 08/16/19  Cindy Garnette POUR, MD  fluticasone [facility]) 50 MCG/ACT nasal spray Place 1 spray into both nostrils daily. 01/30/20 01/29/21  Smith, Laura E, NP  guaiFENesin-codeine (CHERATUSSIN AC) 100-10 MG/5ML syrup Take 5 mLs by mouth 4 (four) times daily as needed for cough. 10/03/21   Banister, Pamela K, MD  lovastatin (MEVACOR) 40 MG tablet Take 80 mg by mouth at bedtime.     [provider]  memantine (NAMENDA) 10 MG tablet Take 1 tablet (10 mg total) by mouth 2 (two) times daily. 07/22/22   Gayland Lauraine PARAS, NP  mirtazapine (REMERON) 15 MG tablet Take 15 mg by mouth at bedtime. 11/13/20   [provider]  Allergies Pantoprazole and Penicillins  Review of Systems Review of Systems As noted in HPI  Physical Exam Vital Signs  I have reviewed the triage vital signs BP 101/67 (BP Location: Left Arm)   Pulse 79   Temp (!) 97.4 F (36.3 C) (Axillary)   Resp 18   Ht 5' 2 (1.575 m)   Wt 49.9 kg   SpO2 100%   BMI 20.12 kg/m   Physical Exam Constitutional:      General: She is not in acute distress.    Appearance: She is well-developed. She is not diaphoretic.  HENT:     Head: Normocephalic and atraumatic.     Right Ear: External ear normal.     Left Ear: External ear normal.     Nose: Nose normal.  Eyes:     General: No scleral icterus.       Right eye: No discharge.        Left eye: No discharge.     Conjunctiva/sclera: Conjunctivae normal.     Pupils: Pupils are equal, round, and reactive to light.  Cardiovascular:     Rate and Rhythm: Normal rate and regular rhythm.     Pulses:          Radial pulses are 2+ on the right side and 2+ on the left side.       Dorsalis pedis pulses are 2+ on the right side and 2+ on the left side.     Heart sounds: Normal heart  sounds. No murmur heard.    No friction rub. No gallop.  Pulmonary:     Effort: Pulmonary effort is normal. No respiratory distress.     Breath sounds: Normal breath sounds. No stridor. No wheezing.  Abdominal:     General: There is no distension.     Palpations: Abdomen is soft.     Tenderness: There is no abdominal tenderness.  Musculoskeletal:        General: No tenderness.     Cervical back: Normal range of motion and neck supple. No bony tenderness.     Thoracic back: No bony tenderness.     Lumbar back: No bony tenderness.     Comments: Clavicles stable. Chest stable to AP/Lat compression. Pelvis stable to Lat compression. No obvious extremity deformity. No chest or abdominal wall contusion.  Skin:    General: Skin is warm and dry.     Findings: No erythema or rash.  Neurological:     Mental Status: She is alert. Mental status is at baseline.     Comments: Moving all extremities     ED Results and Treatments Labs (all labs ordered are listed, but only abnormal results are displayed) Labs Reviewed  CBC WITH DIFFERENTIAL/PLATELET - Abnormal; Notable for the following components:      Result Value   Neutro Abs 8.3 (*)    All other components within normal limits  BASIC METABOLIC PANEL WITH GFR - Abnormal; Notable for the following components:   Glucose, Bld 105 (*)    All other components within normal limits  EKG  EKG Interpretation Date/Time:    Ventricular Rate:    PR Interval:    QRS Duration:    QT Interval:    QTC Calculation:   R Axis:      Text Interpretation:         Radiology CT HEAD WO CONTRAST ( ) Result Date: 11/07/2023 EXAM: CT HEAD AND CERVICAL SPINE 11/07/2023 12:45:55 AM TECHNIQUE: CT of the head and cervical spine was performed without the administration of intravenous contrast. Multiplanar reformatted images are  provided for review. Automated exposure control, iterative reconstruction, and/or weight based adjustment of the mA/kV was utilized to reduce the radiation dose to as low as reasonably achievable. COMPARISON: 11/12/2019 CLINICAL HISTORY: Head trauma, minor (Age >= 65y); Head trauma, moderate-severe. Per chart: Pt bib ems from home. Slid out of bed. Called out to daughter. No LOC. No neck pain. C/o lumbar and left leg pain. No shortening or rotation noted. Pt has dementia but is at baseline. FINDINGS: CT HEAD BRAIN AND VENTRICLES: No acute intracranial hemorrhage. No mass effect or midline shift. No abnormal extra-axial fluid collection. Gray-white differentiation is maintained. No hydrocephalus. Mild volume loss. ORBITS: No acute abnormality. SINUSES AND MASTOIDS: No acute abnormality. SOFT TISSUES AND SKULL: No acute skull fracture. No acute soft tissue abnormality. CT CERVICAL SPINE BONES AND ALIGNMENT: No acute fracture or traumatic malalignment. DEGENERATIVE CHANGES: No significant degenerative changes. SOFT TISSUES: No prevertebral soft tissue swelling. IMPRESSION: 1. No acute intracranial abnormality. 2. No acute fracture or traumatic malalignment of the cervical spine. Electronically signed by: Franky Stanford MD 11/07/2023 12:56 AM EDT RP Workstation: [id number]   CT Cervical Spine Wo Contrast Result Date: 11/07/2023 EXAM: CT HEAD AND CERVICAL SPINE 11/07/2023 12:45:55 AM TECHNIQUE: CT of the head and cervical spine was performed without the administration of intravenous contrast. Multiplanar reformatted images are provided for review. Automated exposure control, iterative reconstruction, and/or weight based adjustment of the mA/kV was utilized to reduce the radiation dose to as low as reasonably achievable. COMPARISON: 11/12/2019 CLINICAL HISTORY: Head trauma, minor (Age >= 65y); Head trauma, moderate-severe. Per chart: Pt bib ems from home. Slid out of bed. Called out to daughter. No LOC. No neck pain. C/o  lumbar and left leg pain. No shortening or rotation noted. Pt has dementia but is at baseline. FINDINGS: CT HEAD BRAIN AND VENTRICLES: No acute intracranial hemorrhage. No mass effect or midline shift. No abnormal extra-axial fluid collection. Gray-white differentiation is maintained. No hydrocephalus. Mild volume loss. ORBITS: No acute abnormality. SINUSES AND MASTOIDS: No acute abnormality. SOFT TISSUES AND SKULL: No acute skull fracture. No acute soft tissue abnormality. CT CERVICAL SPINE BONES AND ALIGNMENT: No acute fracture or traumatic malalignment. DEGENERATIVE CHANGES: No significant degenerative changes. SOFT TISSUES: No prevertebral soft tissue swelling. IMPRESSION: 1. No acute intracranial abnormality. 2. No acute fracture or traumatic malalignment of the cervical spine. Electronically signed by: Franky Stanford MD 11/07/2023 12:56 AM EDT RP Workstation: [id number]    Medications Ordered in ED Medications - No data to display Procedures Procedures  (including critical care time) Medical Decision Making / ED Course   Medical Decision Making Amount and/or Complexity of Data Reviewed Labs: ordered. Decision-making details documented in ED Course. Radiology: ordered and independent interpretation performed.  Risk Decision regarding hospitalization.     Clinical Course as of 11/07/23 0222  Sat Nov 07, 2023  0038 Unwitnessed fall at home.  No signs of significant trauma on exam.  She does have a history of dementia and prior hyponatremia.  Will  get labs and imaging to rule out any electrolyte/metabolic derangements that might of contributed to her her fall.  Will also rule out any intracranial cervical injuries. [PC]  0221 CBC with Differential(!) No leukocytosis or anemia  [PC]  0221 Basic metabolic panel(!) No electrolyte derangements or renal insufficiency [PC]  0221 CT HEAD WO CONTRAST ( ) Negative for ICH or other acute injury [PC]  0221 CT Cervical Spine Wo  Contrast Negative for cervical injury [PC]    Clinical Course User Index [PC] Viet Kemmerer, Raynell Moder, MD    Final Clinical Impression(s) / ED Diagnoses Final diagnoses:  Fall in home, initial encounter   The patient appears reasonably screened and/or stabilized for discharge and I doubt any other medical condition or other [facility] requiring further screening, evaluation, or treatment in the ED at this time. I have discussed the findings, Dx and Tx plan with the patient/family who expressed understanding and agree(s) with the plan. Discharge instructions discussed at length. The patient/family was given strict return precautions who verbalized understanding of the instructions. No further questions at time of discharge.  Disposition: Discharge  Condition: Good  ED Discharge Orders     None       Follow Up: Valma Carwin, MD [id number] [place] [id number] [phone]  Call  to schedule an appointment for close follow up    This chart was dictated using voice recognition software.  Despite best efforts to proofread,  errors can occur which can change the documentation meaning.    Trine Raynell Moder, MD 11/07/23 GARLON

## 2023-11-07 ENCOUNTER — Emergency Department (HOSPITAL_COMMUNITY)

## 2023-11-07 LAB — CBC WITH DIFFERENTIAL/PLATELET
Abs Immature Granulocytes: 0.02 K/uL (ref 0.00–0.07)
Basophils Absolute: 0 K/uL (ref 0.0–0.1)
Basophils Relative: 0 %
Eosinophils Absolute: 0.1 K/uL (ref 0.0–0.5)
Eosinophils Relative: 1 %
HCT: 38.9 % (ref 36.0–46.0)
Hemoglobin: 12.1 g/dL (ref 12.0–15.0)
Immature Granulocytes: 0 %
Lymphocytes Relative: 8 %
Lymphs Abs: 0.8 K/uL (ref 0.7–4.0)
MCH: 27.5 pg (ref 26.0–34.0)
MCHC: 31.1 g/dL (ref 30.0–36.0)
MCV: 88.4 fL (ref 80.0–100.0)
Monocytes Absolute: 0.8 K/uL (ref 0.1–1.0)
Monocytes Relative: 8 %
Neutro Abs: 8.3 K/uL — ABNORMAL HIGH (ref 1.7–7.7)
Neutrophils Relative %: 83 %
Platelets: 225 K/uL (ref 150–400)
RBC: 4.4 MIL/uL (ref 3.87–5.11)
RDW: 14.3 % (ref 11.5–15.5)
WBC: 10 K/uL (ref 4.0–10.5)
nRBC: 0 % (ref 0.0–0.2)

## 2023-11-07 LAB — BASIC METABOLIC PANEL WITH GFR
Anion gap: 10 (ref 5–15)
BUN: 23 mg/dL (ref 8–23)
CO2: 26 mmol/L (ref 22–32)
Calcium: 9.2 mg/dL (ref 8.9–10.3)
Chloride: 104 mmol/L (ref 98–111)
Creatinine, Ser: 0.64 mg/dL (ref 0.44–1.00)
GFR, Estimated: >60 mL/min (ref >60)
Glucose, Bld: 105 mg/dL — ABNORMAL HIGH (ref 70–99)
Potassium: 3.6 mmol/L (ref 3.5–5.1)
Sodium: 140 mmol/L (ref 135–145)

## 2024-03-07 ENCOUNTER — Other Ambulatory Visit: Payer: Self-pay | Admitting: Internal Medicine

## 2024-03-07 DIAGNOSIS — Z1231 Encounter for screening mammogram for malignant neoplasm of breast: Secondary | ICD-10-CM

## 2024-04-08 ENCOUNTER — Ambulatory Visit
Admission: RE | Admit: 2024-04-08 | Discharge: 2024-04-08 | Disposition: A | Discharge status: Home / Self Care | Source: Ambulatory Visit | Attending: Internal Medicine | Admitting: Internal Medicine

## 2024-04-08 DIAGNOSIS — Z1231 Encounter for screening mammogram for malignant neoplasm of breast: Secondary | ICD-10-CM
